# Patient Record
Sex: Male | Born: 2000 | Race: White | Hispanic: No | Marital: Single | Smoking: Current every day smoker
Health system: Southern US, Community
[De-identification: ages and names within clinical notes are randomized; demographics above are authoritative.]

## PROBLEM LIST (undated history)

## (undated) DIAGNOSIS — E119 Type 2 diabetes mellitus without complications: Secondary | ICD-10-CM

## (undated) DIAGNOSIS — K900 Celiac disease: Secondary | ICD-10-CM

## (undated) HISTORY — PX: NO PAST SURGERIES: SHX2092

---
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
## 2018-10-15 DIAGNOSIS — E109 Type 1 diabetes mellitus without complications: Secondary | ICD-10-CM | POA: Diagnosis not present

## 2019-02-14 ENCOUNTER — Emergency Department
Admission: EM | Admit: 2019-02-14 | Discharge: 2019-02-14 | Disposition: A | Discharge status: Home / Self Care | Payer: 59 | Attending: Emergency Medicine | Admitting: Emergency Medicine

## 2019-02-14 ENCOUNTER — Encounter: Payer: Self-pay | Admitting: Emergency Medicine

## 2019-02-14 ENCOUNTER — Emergency Department: Payer: 59

## 2019-02-14 ENCOUNTER — Other Ambulatory Visit: Payer: Self-pay

## 2019-02-14 DIAGNOSIS — Z79899 Other long term (current) drug therapy: Secondary | ICD-10-CM | POA: Diagnosis not present

## 2019-02-14 DIAGNOSIS — R202 Paresthesia of skin: Secondary | ICD-10-CM | POA: Insufficient documentation

## 2019-02-14 DIAGNOSIS — K900 Celiac disease: Secondary | ICD-10-CM | POA: Diagnosis not present

## 2019-02-14 DIAGNOSIS — E1065 Type 1 diabetes mellitus with hyperglycemia: Secondary | ICD-10-CM | POA: Diagnosis not present

## 2019-02-14 DIAGNOSIS — R739 Hyperglycemia, unspecified: Secondary | ICD-10-CM

## 2019-02-14 DIAGNOSIS — Z794 Long term (current) use of insulin: Secondary | ICD-10-CM | POA: Diagnosis not present

## 2019-02-14 DIAGNOSIS — M542 Cervicalgia: Secondary | ICD-10-CM | POA: Diagnosis present

## 2019-02-14 LAB — CBC WITH DIFFERENTIAL/PLATELET
Abs Immature Granulocytes: 0.04 10*3/uL (ref 0.00–0.07)
Basophils Absolute: 0 10*3/uL (ref 0.0–0.1)
Basophils Relative: 1 %
Eosinophils Absolute: 0.2 10*3/uL (ref 0.0–0.5)
Eosinophils Relative: 4 %
HCT: 46.4 % (ref 39.0–52.0)
Hemoglobin: 16.4 g/dL (ref 13.0–17.0)
Immature Granulocytes: 1 %
Lymphocytes Relative: 27 %
Lymphs Abs: 1.4 10*3/uL (ref 0.7–4.0)
MCH: 34.4 pg — ABNORMAL HIGH (ref 26.0–34.0)
MCHC: 35.3 g/dL (ref 30.0–36.0)
MCV: 97.3 fL (ref 80.0–100.0)
Monocytes Absolute: 0.3 10*3/uL (ref 0.1–1.0)
Monocytes Relative: 7 %
Neutro Abs: 3.1 10*3/uL (ref 1.7–7.7)
Neutrophils Relative %: 60 %
Platelets: 279 10*3/uL (ref 150–400)
RBC: 4.77 MIL/uL (ref 4.22–5.81)
RDW: 11.4 % — ABNORMAL LOW (ref 11.5–15.5)
WBC: 5.2 10*3/uL (ref 4.0–10.5)
nRBC: 0 % (ref 0.0–0.2)

## 2019-02-14 LAB — COMPREHENSIVE METABOLIC PANEL
ALT: 31 U/L (ref 0–44)
AST: 25 U/L (ref 15–41)
Albumin: 4.2 g/dL (ref 3.5–5.0)
Alkaline Phosphatase: 105 U/L (ref 38–126)
Anion gap: 16 — ABNORMAL HIGH (ref 5–15)
BUN: 25 mg/dL — ABNORMAL HIGH (ref 6–20)
CO2: 20 mmol/L — ABNORMAL LOW (ref 22–32)
Calcium: 9.6 mg/dL (ref 8.9–10.3)
Chloride: 100 mmol/L (ref 98–111)
Creatinine, Ser: 0.9 mg/dL (ref 0.61–1.24)
GFR calc Af Amer: >60 mL/min (ref >60)
GFR calc non Af Amer: >60 mL/min (ref >60)
Glucose, Bld: 342 mg/dL — ABNORMAL HIGH (ref 70–99)
Potassium: 4.4 mmol/L (ref 3.5–5.1)
Sodium: 136 mmol/L (ref 135–145)
Total Bilirubin: 0.9 mg/dL (ref 0.3–1.2)
Total Protein: 7.4 g/dL (ref 6.5–8.1)

## 2019-02-14 LAB — GLUCOSE, CAPILLARY: Glucose-Capillary: 460 mg/dL — ABNORMAL HIGH (ref 70–99)
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
## 2019-02-14 MED ORDER — CYCLOBENZAPRINE HCL 5 MG PO TABS: 5.0000 mg | ORAL_TABLET | Freq: Three times a day (TID) | ORAL | 0 refills | Status: AC | PRN

## 2019-02-14 MED ORDER — SODIUM CHLORIDE 0.9 % IV BOLUS
1000.0000 mL | Freq: Once | INTRAVENOUS | Status: AC
  Administered 2019-02-14: 1000 mL via INTRAVENOUS
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
## 2019-02-14 NOTE — ED Notes (Signed)
Pt Mother- Joelene Millin  [phone]

## 2019-05-26 ENCOUNTER — Observation Stay: Payer: 59

## 2019-05-26 ENCOUNTER — Other Ambulatory Visit: Payer: Self-pay

## 2019-05-26 ENCOUNTER — Observation Stay
Admission: EM | Admit: 2019-05-26 | Discharge: 2019-05-27 | Disposition: A | Discharge status: Home / Self Care | Payer: 59 | Attending: Family Medicine | Admitting: Family Medicine

## 2019-05-26 ENCOUNTER — Encounter: Payer: Self-pay | Admitting: Emergency Medicine

## 2019-05-26 ENCOUNTER — Emergency Department: Payer: 59

## 2019-05-26 DIAGNOSIS — N179 Acute kidney failure, unspecified: Secondary | ICD-10-CM | POA: Diagnosis not present

## 2019-05-26 DIAGNOSIS — R0602 Shortness of breath: Secondary | ICD-10-CM | POA: Diagnosis present

## 2019-05-26 DIAGNOSIS — E1110 Type 2 diabetes mellitus with ketoacidosis without coma: Secondary | ICD-10-CM | POA: Diagnosis present

## 2019-05-26 DIAGNOSIS — Z794 Long term (current) use of insulin: Secondary | ICD-10-CM | POA: Insufficient documentation

## 2019-05-26 DIAGNOSIS — R109 Unspecified abdominal pain: Secondary | ICD-10-CM

## 2019-05-26 DIAGNOSIS — E1010 Type 1 diabetes mellitus with ketoacidosis without coma: Principal | ICD-10-CM

## 2019-05-26 DIAGNOSIS — U071 COVID-19: Secondary | ICD-10-CM | POA: Diagnosis not present

## 2019-05-26 LAB — BASIC METABOLIC PANEL
Anion gap: 15 (ref 5–15)
Anion gap: 14 (ref 5–15)
BUN: 18 mg/dL (ref 6–20)
BUN: 17 mg/dL (ref 6–20)
CO2: 19 mmol/L — ABNORMAL LOW (ref 22–32)
CO2: 18 mmol/L — ABNORMAL LOW (ref 22–32)
Calcium: 8.2 mg/dL — ABNORMAL LOW (ref 8.9–10.3)
Calcium: 8.2 mg/dL — ABNORMAL LOW (ref 8.9–10.3)
Chloride: 105 mmol/L (ref 98–111)
Chloride: 105 mmol/L (ref 98–111)
Creatinine, Ser: 0.93 mg/dL (ref 0.61–1.24)
Creatinine, Ser: 0.89 mg/dL (ref 0.61–1.24)
GFR calc Af Amer: >60 mL/min (ref >60)
GFR calc Af Amer: >60 mL/min (ref >60)
GFR calc non Af Amer: >60 mL/min (ref >60)
GFR calc non Af Amer: >60 mL/min (ref >60)
Glucose, Bld: 125 mg/dL — ABNORMAL HIGH (ref 70–99)
Glucose, Bld: 182 mg/dL — ABNORMAL HIGH (ref 70–99)
Potassium: 3.9 mmol/L (ref 3.5–5.1)
Potassium: 3.3 mmol/L — ABNORMAL LOW (ref 3.5–5.1)
Sodium: 139 mmol/L (ref 135–145)
Sodium: 137 mmol/L (ref 135–145)

## 2019-05-26 LAB — URINALYSIS, ROUTINE W REFLEX MICROSCOPIC
Bacteria, UA: NONE SEEN
Bilirubin Urine: NEGATIVE
Glucose, UA: >=500 mg/dL — AB
Hgb urine dipstick: NEGATIVE
Ketones, ur: 80 mg/dL — AB
Leukocytes,Ua: NEGATIVE
Nitrite: NEGATIVE
Protein, ur: 100 mg/dL — AB
Specific Gravity, Urine: 1.024 (ref 1.005–1.030)
pH: 6 (ref 5.0–8.0)

## 2019-05-26 LAB — COMPREHENSIVE METABOLIC PANEL
ALT: 33 U/L (ref 0–44)
AST: 25 U/L (ref 15–41)
Albumin: 5.2 g/dL — ABNORMAL HIGH (ref 3.5–5.0)
Alkaline Phosphatase: 135 U/L — ABNORMAL HIGH (ref 38–126)
Anion gap: 28 — ABNORMAL HIGH (ref 5–15)
BUN: 19 mg/dL (ref 6–20)
CO2: 11 mmol/L — ABNORMAL LOW (ref 22–32)
Calcium: 9.4 mg/dL (ref 8.9–10.3)
Chloride: 99 mmol/L (ref 98–111)
Creatinine, Ser: 1.54 mg/dL — ABNORMAL HIGH (ref 0.61–1.24)
GFR calc Af Amer: >60 mL/min (ref >60)
GFR calc non Af Amer: >60 mL/min (ref >60)
Glucose, Bld: 295 mg/dL — ABNORMAL HIGH (ref 70–99)
Potassium: 4.5 mmol/L (ref 3.5–5.1)
Sodium: 138 mmol/L (ref 135–145)
Total Bilirubin: 2.1 mg/dL — ABNORMAL HIGH (ref 0.3–1.2)
Total Protein: 9.6 g/dL — ABNORMAL HIGH (ref 6.5–8.1)

## 2019-05-26 LAB — GLUCOSE, CAPILLARY
Glucose-Capillary: 105 mg/dL — ABNORMAL HIGH (ref 70–99)
Glucose-Capillary: 130 mg/dL — ABNORMAL HIGH (ref 70–99)
Glucose-Capillary: 140 mg/dL — ABNORMAL HIGH (ref 70–99)
Glucose-Capillary: 157 mg/dL — ABNORMAL HIGH (ref 70–99)
Glucose-Capillary: 182 mg/dL — ABNORMAL HIGH (ref 70–99)
Glucose-Capillary: 183 mg/dL — ABNORMAL HIGH (ref 70–99)
Glucose-Capillary: 246 mg/dL — ABNORMAL HIGH (ref 70–99)
Glucose-Capillary: 75 mg/dL (ref 70–99)
Glucose-Capillary: 90 mg/dL (ref 70–99)
Glucose-Capillary: 97 mg/dL (ref 70–99)

## 2019-05-26 LAB — CBC
HCT: 58.4 % — ABNORMAL HIGH (ref 39.0–52.0)
Hemoglobin: 20.5 g/dL — ABNORMAL HIGH (ref 13.0–17.0)
MCH: 33.2 pg (ref 26.0–34.0)
MCHC: 35.1 g/dL (ref 30.0–36.0)
MCV: 94.5 fL (ref 80.0–100.0)
Platelets: 338 10*3/uL (ref 150–400)
RBC: 6.18 MIL/uL — ABNORMAL HIGH (ref 4.22–5.81)
RDW: 11.9 % (ref 11.5–15.5)
WBC: 7.2 10*3/uL (ref 4.0–10.5)
nRBC: 0 % (ref 0.0–0.2)

## 2019-05-26 LAB — BLOOD GAS, VENOUS
Acid-base deficit: 13.1 mmol/L — ABNORMAL HIGH (ref 0.0–2.0)
Bicarbonate: 13.5 mmol/L — ABNORMAL LOW (ref 20.0–28.0)
O2 Saturation: 65.7 %
Patient temperature: 37
pCO2, Ven: 33 mmHg — ABNORMAL LOW (ref 44.0–60.0)
pH, Ven: 7.22 — ABNORMAL LOW (ref 7.250–7.430)
pO2, Ven: 42 mmHg (ref 32.0–45.0)

## 2019-05-26 LAB — BETA-HYDROXYBUTYRIC ACID: Beta-Hydroxybutyric Acid: >8 mmol/L — ABNORMAL HIGH (ref 0.05–0.27)

## 2019-05-26 LAB — SARS CORONAVIRUS 2 (TAT 6-24 HRS): SARS Coronavirus 2: POSITIVE — AB

## 2019-05-26 IMAGING — DX DG ABD PORTABLE 1V
2 series · 2 of 2 positions shown · non-contrast
Comparison: None.

CLINICAL DATA: 18-year-old male with tachycardia, weakness and
vomiting.

EXAM:
PORTABLE ABDOMEN - 1 VIEW

[abdomen supine (1 of 2)]
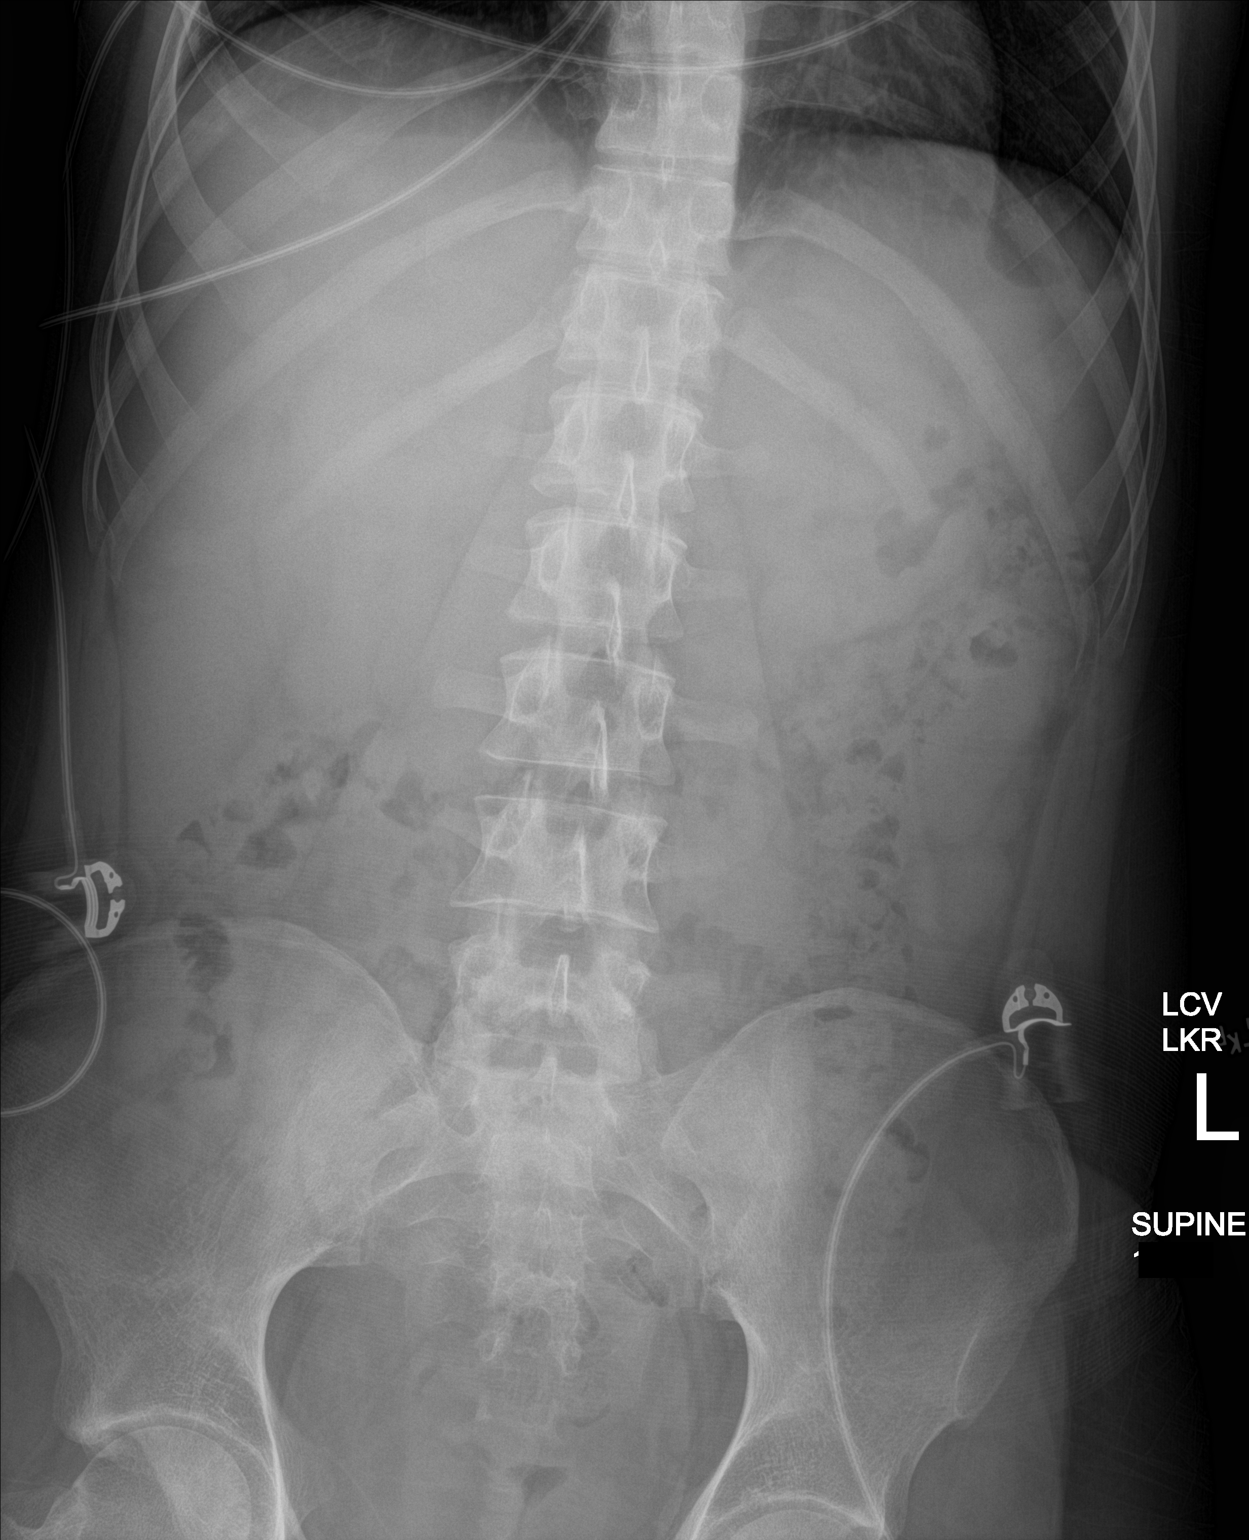

[abdomen supine (2 of 2)]
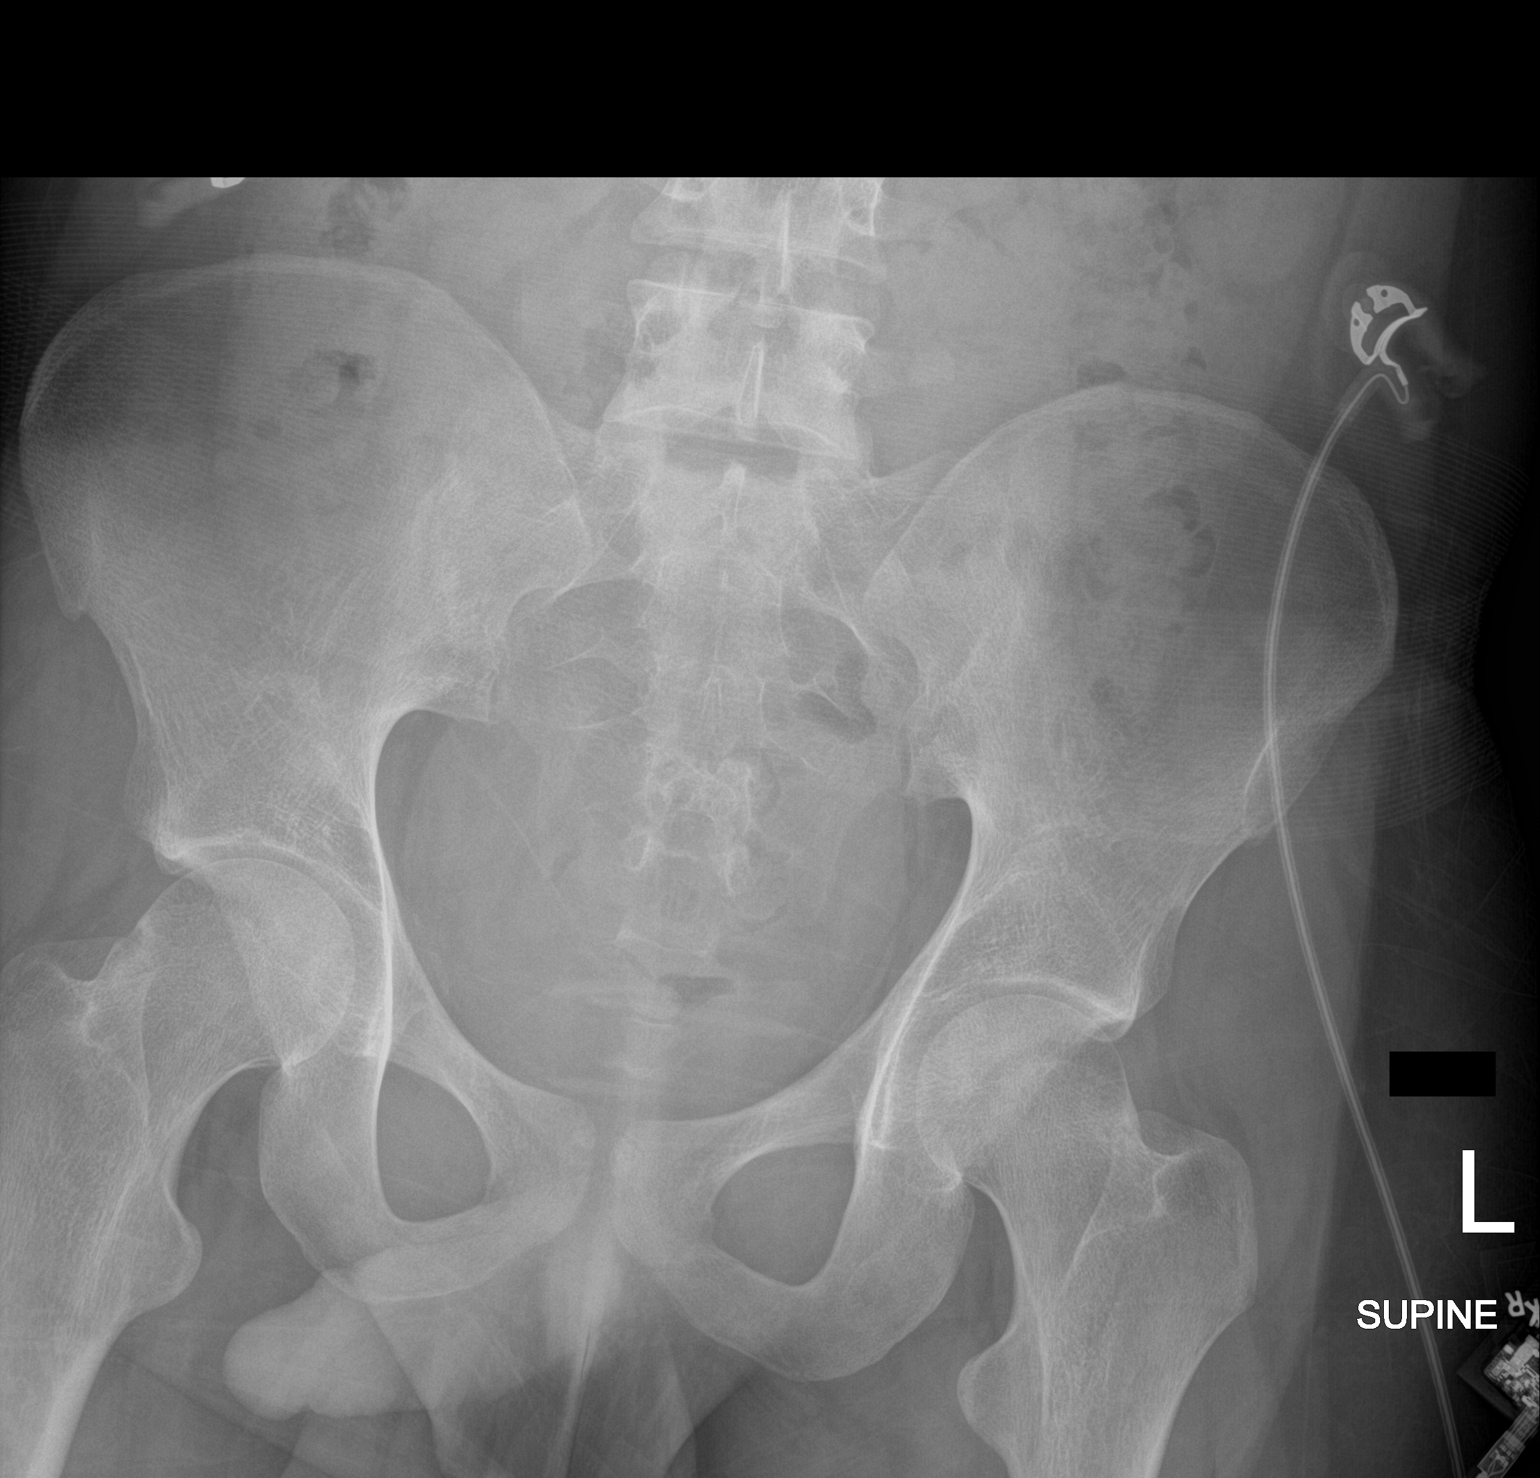

[2 of 2 positions shown; findings below may reference images not displayed]

FINDINGS: Supine views of the abdomen and pelvis. Grossly negative visible
lung bases. Non obstructed bowel gas pattern. No pneumoperitoneum is
evident on these supine views. Abdominal and pelvic visceral
contours are within normal limits. No osseous abnormality
identified.
IMPRESSION: Negative.

## 2019-05-26 IMAGING — CR DG CHEST 2V
2 series · 2 of 2 positions shown · non-contrast
Comparison: None.

CLINICAL DATA: 18-year-old male with shortness of breath, vomiting,
left side chest pain.

EXAM:
CHEST - 2 VIEW

[chest pa]
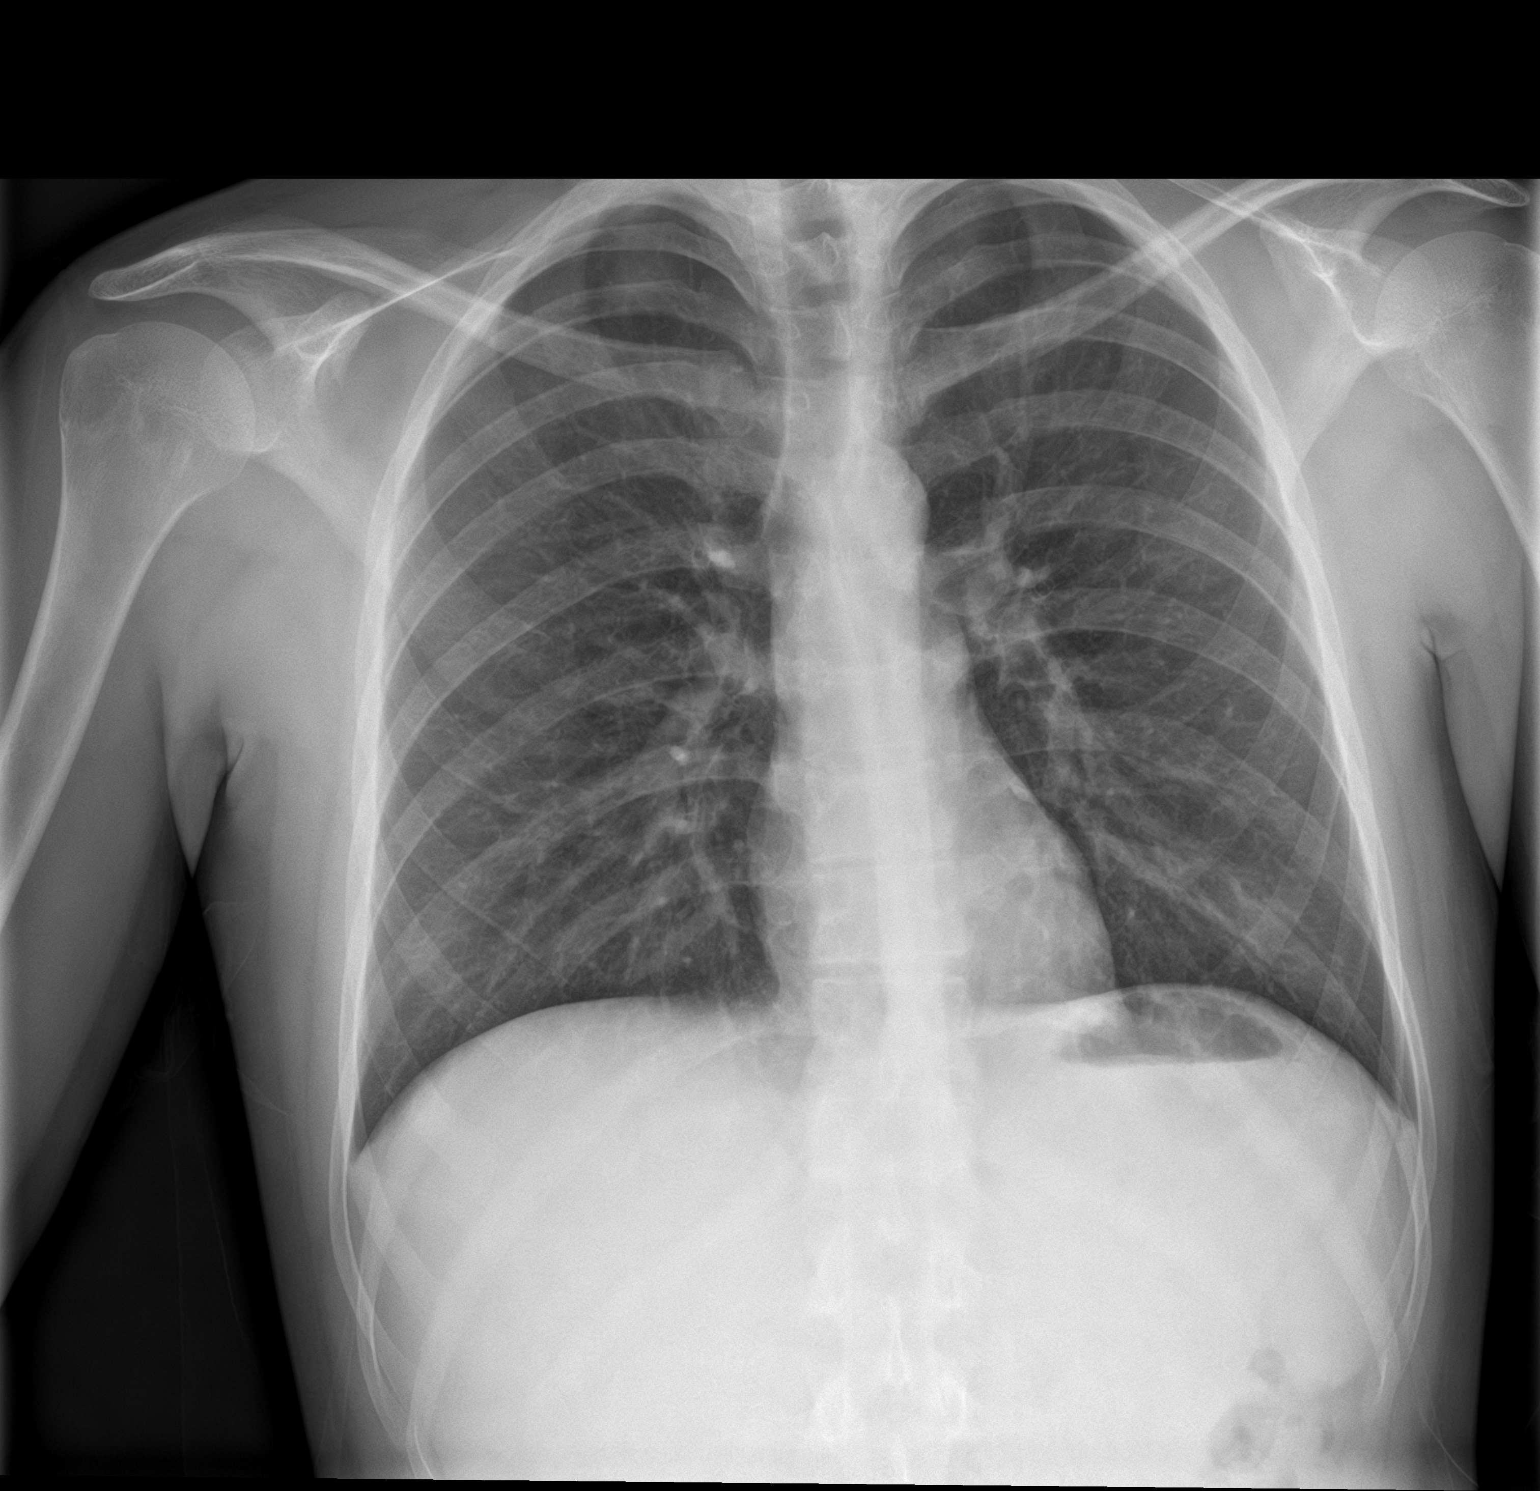

[chest lat]
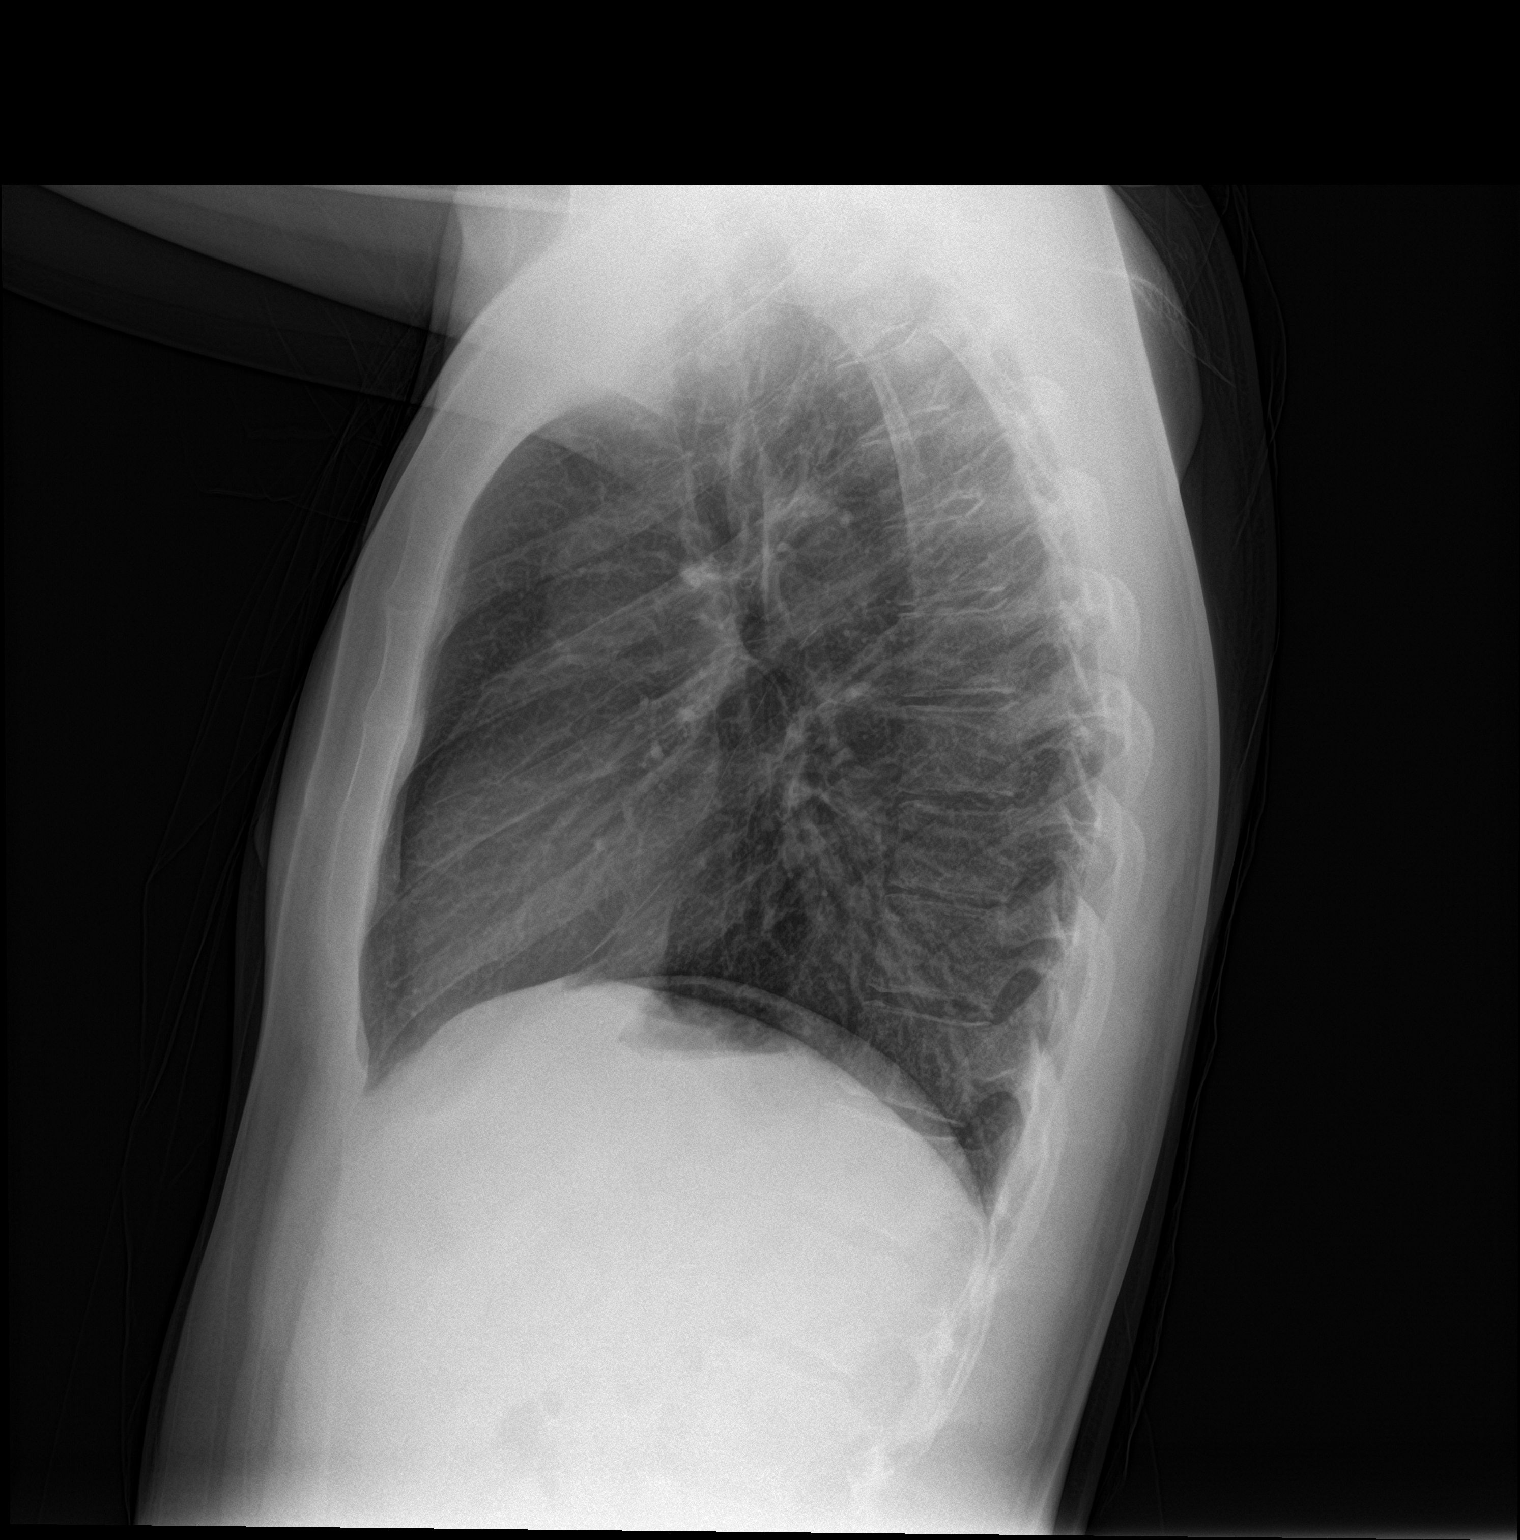

[2 of 2 positions shown; findings below may reference images not displayed]

FINDINGS: Normal lung volumes and mediastinal contours. Visualized tracheal
air column is within normal limits. Bilateral lung markings are
within normal limits. No pneumothorax, pleural effusion or abnormal
pulmonary opacity. Negative visible bowel gas and osseous
structures.
IMPRESSION: Negative.  No cardiopulmonary abnormality.

## 2019-05-26 MED ORDER — INSULIN ASPART 100 UNIT/ML ~~LOC~~ SOLN
0.0000 [IU] | Freq: Every day | SUBCUTANEOUS | Status: DC
  Administered 2019-05-26: 2 [IU] via SUBCUTANEOUS
  Filled 2019-05-26: qty 1

## 2019-05-26 MED ORDER — INSULIN ASPART 100 UNIT/ML ~~LOC~~ SOLN
0.0000 [IU] | Freq: Three times a day (TID) | SUBCUTANEOUS | Status: DC
  Administered 2019-05-27: 8 [IU] via SUBCUTANEOUS
  Filled 2019-05-26: qty 1

## 2019-05-26 MED ORDER — DEXTROSE-NACL 5-0.45 % IV SOLN: INTRAVENOUS | Status: DC

## 2019-05-26 MED ORDER — POLYETHYLENE GLYCOL 3350 17 G PO PACK: 17.0000 g | PACK | Freq: Every day | ORAL | Status: DC | PRN

## 2019-05-26 MED ORDER — POTASSIUM CHLORIDE 10 MEQ/100ML IV SOLN
10.0000 meq | INTRAVENOUS | Status: AC
  Filled 2019-05-26 (×2): qty 100

## 2019-05-26 MED ORDER — SODIUM CHLORIDE 0.9 % IV SOLN: INTRAVENOUS | Status: DC

## 2019-05-26 MED ORDER — ALUM & MAG HYDROXIDE-SIMETH 200-200-20 MG/5ML PO SUSP: 30.0000 mL | ORAL | Status: DC | PRN

## 2019-05-26 MED ORDER — MORPHINE SULFATE (PF) 2 MG/ML IV SOLN: 2.0000 mg | INTRAVENOUS | Status: DC | PRN

## 2019-05-26 MED ORDER — ONDANSETRON HCL 4 MG/2ML IJ SOLN: 4.0000 mg | Freq: Four times a day (QID) | INTRAMUSCULAR | Status: DC | PRN

## 2019-05-26 MED ORDER — ACETAMINOPHEN 325 MG PO TABS: 650.0000 mg | ORAL_TABLET | Freq: Four times a day (QID) | ORAL | Status: DC | PRN

## 2019-05-26 MED ORDER — HEPARIN SODIUM (PORCINE) 5000 UNIT/ML IJ SOLN
5000.0000 [IU] | Freq: Three times a day (TID) | INTRAMUSCULAR | Status: DC
  Administered 2019-05-26: 5000 [IU] via SUBCUTANEOUS
  Filled 2019-05-26 (×2): qty 1

## 2019-05-26 MED ORDER — DEXTROSE-NACL 5-0.45 % IV SOLN
INTRAVENOUS | Status: DC
  Administered 2019-05-26: 12:00:00 via INTRAVENOUS

## 2019-05-26 MED ORDER — INSULIN GLARGINE 100 UNIT/ML ~~LOC~~ SOLN
20.0000 [IU] | Freq: Every day | SUBCUTANEOUS | Status: DC
  Administered 2019-05-26: 20 [IU] via SUBCUTANEOUS
  Filled 2019-05-26 (×2): qty 0.2

## 2019-05-26 MED ORDER — LACTATED RINGERS IV BOLUS
1000.0000 mL | Freq: Once | INTRAVENOUS | Status: AC
  Administered 2019-05-26: 1000 mL via INTRAVENOUS

## 2019-05-26 MED ORDER — INSULIN REGULAR(HUMAN) IN NACL 100-0.9 UT/100ML-% IV SOLN
INTRAVENOUS | Status: DC
  Administered 2019-05-26: 0.3 [IU]/h via INTRAVENOUS

## 2019-05-26 MED ORDER — INSULIN REGULAR(HUMAN) IN NACL 100-0.9 UT/100ML-% IV SOLN
INTRAVENOUS | Status: DC
  Filled 2019-05-26: qty 100

## 2019-05-26 NOTE — ED Notes (Signed)
Insulin continued to be held. CBG 91.
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
## 2019-05-26 NOTE — ED Triage Notes (Signed)
Pt reports symptoms started Saturday. Pt c/o feeling his heart is beating fast and he is really weak and tired and feels like he cannot breathe.

## 2019-05-26 NOTE — ED Notes (Signed)
Hospitalist notified of positive covid.

## 2019-05-26 NOTE — H&P (Signed)
[facility] History and Physical   Patient: Jesus Ewing KNL:[id number]   PCP: Patient, No Pcp Per DOB: 07/17/2001   DOA: 05/26/2019   DOS: 05/26/2019   DOS: the patient was seen and examined on 05/26/2019  Patient coming from: The patient is coming from Home  Chief Complaint: Abdominal pain and nausea and vomiting  HPI: [name] is a 18 y.o. male with Past medical history of type 1 diabetes mellitus, celiac disease. Patient with type 1 diabetes mellitus. Who presented with complaints of nausea and vomitings and abdominal pain ongoing since Friday night. He denies any diarrhea or constipation. He denies having any fever or chills. He mentions that he also has some abdominal pain but not passing gas. No swelling of the leg. He mentioned that he is compliant with all his medication and has not missed out on any of his medications.  ED Course: Presented with this complaint. Anion gap was 28 on admission. Patient was started on DKA protocol and was referred for admission.  At his baseline ambulates without assistance independent for most of his ADL;  manages his medication on his own.  Review of Systems: as mentioned in the history of present illness.  All other systems reviewed and are negative.  Past Medical History:  Diagnosis Date  . Celiac disease   . Diabetes mellitus without complication [facility])    Past Surgical History:  Procedure Laterality Date  . NO PAST SURGERIES     Social History:  reports that he has never smoked. He has never used smokeless tobacco. He reports that he does not drink alcohol or use drugs.  No Known Allergies   Family history reviewed and not pertinent   Prior to Admission medications   Medication Sig Start Date End Date Taking? Authorizing Provider  insulin lispro (HUMALOG) 100 UNIT/ML KwikPen Inject 0-50 Units into the skin as directed. 07/24/18  Yes [provider]  LANTUS SOLOSTAR 100 UNIT/ML Solostar Pen Inject 30  Units into the skin at bedtime. 05/04/19  Yes [provider]  insulin aspart (NOVOLOG) 100 UNIT/ML injection Inject 1-8 Units into the skin 3 (three) times daily before meals. Using a sliding scale. 1 unit per 25 over 100 blood glucose reading.    [provider]  insulin aspart protamine- aspart (NOVOLOG MIX 70/30) (70-30) 100 UNIT/ML injection Inject 44 Units into the skin 2 (two) times daily with a meal.     [provider]    Physical Exam: Vitals:   05/26/19 0917 05/26/19 0920 05/26/19 1200 05/26/19 1400  BP:  (!) 143/90 (!) 118/57   Pulse:  (!) 145 (!) 103 93  Resp:  (!) 21 15 17   Temp:  98.1 F (36.7 C)    TempSrc:  Oral    SpO2:  99% 100% 99%  Weight: 72.6 kg     Height: 5\' 10"  (1.778 m)       General: alert and oriented to time, place, and person. Appear in moderate distress, affect anxious Eyes: PERRL, Conjunctiva normal ENT: Oral Mucosa Clear, dry  Neck: no JVD, no Abnormal Mass Or lumps Cardiovascular: S1 and S2 Present, no Murmur, peripheral pulses symmetrical Respiratory: good respiratory effort, Bilateral Air entry equal and Decreased, no signs of accessory muscle use, Clear to Auscultation, no Crackles, no wheezes Abdomen: Bowel Sound present, Soft and mild tenderness, no hernia Skin: no rashes  Extremities: no Pedal edema, no calf tenderness Neurologic: without any new focal findings Gait not checked due to patient safety concerns  Data Reviewed: I have personally reviewed and interpreted labs, imaging as discussed below.  CBC: Recent Labs  Lab 05/26/19 0931  WBC 7.2  HGB 20.5*  HCT 58.4*  MCV 94.5  PLT 937   Basic Metabolic Panel: Recent Labs  Lab 05/26/19 0931  NA 138  K 4.5  CL 99  CO2 11*  GLUCOSE 295*  BUN 19  CREATININE 1.54*  CALCIUM 9.4   GFR: Estimated Creatinine Clearance: 78.6 mL/min (A) (by C-G formula based on SCr of 1.54 mg/dL (H)). Liver Function Tests: Recent Labs  Lab 05/26/19 0931  AST 25   ALT 33  ALKPHOS 135*  BILITOT 2.1*  PROT 9.6*  ALBUMIN 5.2*   No results for input(s): LIPASE, AMYLASE in the last 168 hours. No results for input(s): AMMONIA in the last 168 hours. Coagulation Profile: No results for input(s): INR, PROTIME in the last 168 hours. Cardiac Enzymes: No results for input(s): CKTOTAL, CKMB, CKMBINDEX, TROPONINI in the last 168 hours. BNP (last 3 results) No results for input(s): PROBNP in the last 8760 hours. HbA1C: No results for input(s): HGBA1C in the last 72 hours. CBG: Recent Labs  Lab 05/26/19 1216 05/26/19 1330 05/26/19 1442  GLUCAP 90 75 105*   Lipid Profile: No results for input(s): CHOL, HDL, LDLCALC, TRIG, CHOLHDL, LDLDIRECT in the last 72 hours. Thyroid Function Tests: No results for input(s): TSH, T4TOTAL, FREET4, T3FREE, THYROIDAB in the last 72 hours. Anemia Panel: No results for input(s): VITAMINB12, FOLATE, FERRITIN, TIBC, IRON, RETICCTPCT in the last 72 hours. Urine analysis:    Component Value Date/Time   COLORURINE YELLOW (A) 06/19/2016 1742   APPEARANCEUR CLEAR (A) 06/19/2016 1742   LABSPEC 1.028 06/19/2016 1742   PHURINE 5.0 06/19/2016 1742   GLUCOSEU >500 (A) 06/19/2016 1742   HGBUR NEGATIVE 06/19/2016 1742   BILIRUBINUR NEGATIVE 06/19/2016 1742   KETONESUR 2+ (A) 06/19/2016 1742   PROTEINUR 100 (A) 06/19/2016 1742   NITRITE NEGATIVE 06/19/2016 1742   LEUKOCYTESUR NEGATIVE 06/19/2016 1742    Radiological Exams on Admission: Dg Chest 2 View  Result Date: 05/26/2019 CLINICAL DATA:  18 year old male with shortness of breath, vomiting, left side chest pain. EXAM: CHEST - 2 VIEW COMPARISON:  None. FINDINGS: Normal lung volumes and mediastinal contours. Visualized tracheal air column is within normal limits. Bilateral lung markings are within normal limits. No pneumothorax, pleural effusion or abnormal pulmonary opacity. Negative visible bowel gas and osseous structures. IMPRESSION: Negative.  No cardiopulmonary  abnormality. Electronically Signed   By: Genevie Ann M.D.   On: 05/26/2019 09:59   I reviewed all nursing notes, pharmacy notes, vitals, pertinent old records.  Assessment/Plan 1.  DKA. Type 1 diabetes mellitus Acute kidney injury. Hemoconcentration. Hyperbilirubinemia. pH 7.22. Beta hydroxybutyric acid higher than detectable range. UA currently pending. Continue with aggressive hydration. Started on bolus in the ED. Also started on IV insulin as well as insulin glucose stabilizer protocol. We will admit to stepdown unit. X-ray abdomen to rule out any acute intra-abdominal pathology.  Nutrition: NPO  DVT Prophylaxis: Subcutaneous Lovenox  Advance goals of care discussion: Full code  Consults: none   Family Communication: no family was present at bedside, at the time of interview.   Disposition: Admitted as observation, step-down unit. Likely to be discharged home, in 2 days.  I have discussed plan of care as described above with RN and patient/family.  Author: Berle Mull, MD [facility] 05/26/2019 3:37 PM   To reach On-call, see care teams to locate the attending and  reach out to them via [email or web address]. If 7PM-7AM, please contact night-coverage If you still have difficulty reaching the attending provider, please page the [facility] (Director on Call) for [facility] on amion for assistance.

## 2019-05-26 NOTE — ED Notes (Signed)
Pt given food brought by friend.

## 2019-05-27 ENCOUNTER — Inpatient Hospital Stay: Admission: AD | Admit: 2019-05-27 | Payer: 59 | Source: Other Acute Inpatient Hospital | Admitting: Family Medicine

## 2019-05-27 DIAGNOSIS — E1010 Type 1 diabetes mellitus with ketoacidosis without coma: Secondary | ICD-10-CM | POA: Diagnosis not present

## 2019-05-27 LAB — GLUCOSE, CAPILLARY
Glucose-Capillary: 308 mg/dL — ABNORMAL HIGH (ref 70–99)
Glucose-Capillary: 280 mg/dL — ABNORMAL HIGH (ref 70–99)

## 2019-05-27 LAB — HEMOGLOBIN A1C
Hgb A1c MFr Bld: 10.1 % — ABNORMAL HIGH (ref 4.8–5.6)
Mean Plasma Glucose: 243.17 mg/dL

## 2019-05-27 LAB — HIV ANTIBODY (ROUTINE TESTING W REFLEX): HIV Screen 4th Generation wRfx: NONREACTIVE

## 2019-05-27 MED ORDER — INSULIN ASPART 100 UNIT/ML ~~LOC~~ SOLN
10.0000 [IU] | Freq: Once | SUBCUTANEOUS | Status: AC
  Administered 2019-05-27: 10 [IU] via SUBCUTANEOUS
  Filled 2019-05-27: qty 1

## 2019-05-27 NOTE — ED Notes (Signed)
Pt alert and oriented X 4, stable for discharge. RR even and unlabored, color WNL. Discussed discharge instructions and follow up when appropriate. Instructed to follow up with ER for any life threatening symptoms or concerns that patient or family of patient may have Pt left wearing mask and verbalizes understanding of quarantine. Verbalizes understanding of strict blood sugar control.

## 2019-05-27 NOTE — ED Notes (Addendum)
CBG 280. Ally,RN made aware.

## 2019-05-27 NOTE — ED Notes (Addendum)
Hospitalist at bedside 

## 2019-05-27 NOTE — ED Notes (Signed)
Pt reports he does not have legal guardian, none listed. Pt calling his mother to come pick him up.

## 2019-05-27 NOTE — ED Notes (Signed)
CBG 308. On call hospitalist messaged.

## 2019-05-27 NOTE — ED Notes (Signed)
Pt resting quietly at this time with TV on. Even respirations.

## 2019-05-27 NOTE — Discharge Summary (Signed)
Physician Discharge Summary  Jesus Ewing KVQ:[id number] DOB: [birth year] DOA: 05/26/2019  PCP: Patient, No Pcp Per  Admit date: 05/26/2019 Discharge date: 05/27/2019  Admitted From: Home Disposition:  [place]: None  Equipment/Devices: None  Discharge Condition: Good  CODE STATUS: FULL Diet recommendation: Diabetic  Brief/Interim Summary: [name] is a 18 y.o. M with T1DM well controlled who presented with few days malaise, cough and N/V epigastric pain.  Had been at a house party over the weekend where someone was rumored to have [place].  Afterwards, started to feel vague malaise, then aches then decreased appetite, then started to have labile sugars, then high sugars and malaise, vomiting, epigastric pain, so he came to the ER.  In the ER, Glucose 300s, Anion gap 28.  Started on insulin and IV fluids.  COVID incidentally positive.     PRINCIPAL HOSPITAL DIAGNOSIS: DKA due to [place]    Discharge Diagnoses:   Diabetic ketoacidosis Type 1 diabetes Patient treated with IV insulin.  Gap closed overnight, transitioned to subQ insulin this morning and sugars controlled.  Patient requesting to go home and self-manage at home.    Mild COVID-19 Has cough, no hypoxia.  CXR clear.    Self-monitoring instructions, return precautions and isolation instructions given clearly.  Patient expressed understanding, has a tiny home where he can easily self-isolate.  Someone can get him a pulse oximeter.         Discharge Instructions  Discharge Instructions    Diet Carb Modified   Complete by: As directed    Discharge instructions   Complete by: As directed    From Dr. Loleta Books: You were admitted for DKA We also found that you had coronavirus (Also known as COVID-19)   For the DKA, we treated you with IV fluids and IV insulin and corrected the DKA.  You should resume your normal diabetes regimen. You may need to use more than your normal home Lantus for now  (if your morniing blood sugar is over 200, I would give extra short acting with each meal that day, and increase your evening dose the next night)  We also have documented that you take 70/30, which I assume you do not.  So don't take this.   If you have any lingering cough, you should take the cough syrup we gave you here, Robitussin (with the ingredients "GUIAFENESIN" and "DEXTROMETHORPHAN")   You should purchase a pulse oximeter at your pharmacy. This is a device that you put on your finger to measure your oxygen level.  They are available at any pharmacy. Use it to check your oxygen level twice daily until you see your primary care doctor. If your oxygen level is ever LESS than 88% and doesn't get better, you should call your primary care doctor immediately.   HOW LONG TO REMAIN IN QUARANTINE: There is no absolutely correct answer to this and so our best answer is to be on the cautious side.  Based on what we know of the virus, you should isolate strictly until 10 days from your first symptoms.  For you that would be next Wednesday PROVIDED YOUR SYMPTOMS ARE ALL RESOLVED BY THEN  Until you end your quarantine: If you have anyone in the home who has NOT had coronavirus:    -do not be in the same room with them until your self isolation is over    -wear a mask and have them wear a mask if you MUST be in the same  room    -clean all hard surfaces (counters, doors, tables) twice a day    -use a separate bathroom at all times     If you have any abdominal pain, can't keep food down, feel like you can't breathe or have O2 levels <88% on the pulse oximter, come back to the ER.   Increase activity slowly   Complete by: As directed      Allergies as of 05/27/2019   No Known Allergies     Medication List    TAKE these medications   insulin aspart 100 UNIT/ML injection Commonly known as: novoLOG Inject 1-8 Units into the skin 3 (three) times daily before meals. Using a sliding  scale. 1 unit per 25 over 100 blood glucose reading.   insulin aspart protamine- aspart (70-30) 100 UNIT/ML injection Commonly known as: NOVOLOG MIX 70/30 Inject 44 Units into the skin 2 (two) times daily with a meal.   insulin lispro 100 UNIT/ML KwikPen Commonly known as: HUMALOG Inject 0-50 Units into the skin as directed.   Lantus SoloStar 100 UNIT/ML Solostar Pen Generic drug: Insulin Glargine Inject 30 Units into the skin at bedtime.      Follow-up Information    Primary care doctor. Schedule an appointment as soon as possible for a visit in 3 week(s).          No Known Allergies  Consultations:     Procedures/Studies: Dg Chest 2 View  Result Date: 05/26/2019 CLINICAL DATA:  18 year old male with shortness of breath, vomiting, left side chest pain. EXAM: CHEST - 2 VIEW COMPARISON:  None. FINDINGS: Normal lung volumes and mediastinal contours. Visualized tracheal air column is within normal limits. Bilateral lung markings are within normal limits. No pneumothorax, pleural effusion or abnormal pulmonary opacity. Negative visible bowel gas and osseous structures. IMPRESSION: Negative.  No cardiopulmonary abnormality. Electronically Signed   By: Odessa Fleming M.D.   On: 05/26/2019 09:59   Dg Abd Portable 1v  Result Date: 05/26/2019 CLINICAL DATA:  18 year old male with tachycardia, weakness and vomiting. EXAM: PORTABLE ABDOMEN - 1 VIEW COMPARISON:  None. FINDINGS: Supine views of the abdomen and pelvis. Grossly negative visible lung bases. Non obstructed bowel gas pattern. No pneumoperitoneum is evident on these supine views. Abdominal and pelvic visceral contours are within normal limits. No osseous abnormality identified. IMPRESSION: Negative. Electronically Signed   By: Odessa Fleming M.D.   On: 05/26/2019 16:13       Subjective: Feeling well.  Tired but no vomiting, no epigastric pain.  Able to eat breakfast.  No confusion. No fever, dyspnea.  Has cough.  Discharge Exam: Vitals:    05/27/19 0849 05/27/19 1011  BP: 117/74 118/76  Pulse:    Resp: 17 16  Temp: 98.2 F (36.8 C) 98 F (36.7 C)  SpO2: 98% 98%   Vitals:   05/27/19 0645 05/27/19 0730 05/27/19 0849 05/27/19 1011  BP:  108/90 117/74 118/76  Pulse: 72     Resp: 13 (!) Temp:   98.2 F (36.8 C) 98 F (36.7 C)  TempSrc:   Oral Oral  SpO2: 98% 100% 98% 98%  Weight:      Height:        General: Pt is alert, awake, not in acute distress Cardiovascular: RRR, nl S1-S2, no murmurs appreciated.   No LE edema.   Respiratory: Normal respiratory rate and rhythm.  CTAB without rales or wheezes. Abdominal: Abdomen soft and non-tender.  No distension or HSM.  Neuro/Psych: Strength symmetric in upper and lower extremities.  Judgment and insight appear normal.   The results of significant diagnostics from this hospitalization (including imaging, microbiology, ancillary and laboratory) are listed below for reference.     Microbiology: Recent Results (from the past 240 hour(s))  SARS CORONAVIRUS 2 (TAT 6-24 HRS) Nasopharyngeal Nasopharyngeal Swab     Status: Abnormal   Collection Time: 05/26/19  1:15 PM   Specimen: Nasopharyngeal Swab  Result Value Ref Range Status   SARS Coronavirus 2 POSITIVE (A) NEGATIVE Final    Comment: RESULT CALLED TO, READ BACK BY AND VERIFIED WITH: J.COLTRAN RN 2129 05/26/2019 MCCORMICK K (NOTE) SARS-CoV-2 target nucleic acids are DETECTED. The SARS-CoV-2 RNA is generally detectable in upper and lower respiratory specimens during the acute phase of infection. Positive results are indicative of active infection with SARS-CoV-2. Clinical  correlation with patient history and other diagnostic information is necessary to determine patient infection status. Positive results do  not rule out bacterial infection or co-infection with other viruses. The expected result is Negative. Fact Sheet for Patients: HairSlick.nohttps://www.fda.gov/media/138098/download Fact Sheet for Healthcare  Providers: quierodirigir.comhttps://www.fda.gov/media/138095/download This test is not yet approved or cleared by the [place] FDA and  has been authorized for detection and/or diagnosis of SARS-CoV-2 by FDA under an Emergency Use Authorization (EUA). This EUA will remain  in effect (meaning this test can be used)  for the duration of the COVID-19 declaration under Section 564(b)(1) of the Act, 21 U.S.C. section 360bbb-3(b)(1), unless the authorization is terminated or revoked sooner. Performed at [facility], [address] [place], [place] [address]      Labs: BNP (last 3 results) No results for input(s): BNP in the last 8760 hours. Basic Metabolic Panel: Recent Labs  Lab 05/26/19 0931 05/26/19 1533 05/26/19 1939  NA 138 139 137  K 4.5 3.9 3.3*  CL 99 105 105  CO2 11* 19* 18*  GLUCOSE 295* 125* 182*  BUN 19 18 17   CREATININE 1.54* 0.93 0.89  CALCIUM 9.4 8.2* 8.2*   Liver Function Tests: Recent Labs  Lab 05/26/19 0931  AST 25  ALT 33  ALKPHOS 135*  BILITOT 2.1*  PROT 9.6*  ALBUMIN 5.2*   No results for input(s): LIPASE, AMYLASE in the last 168 hours. No results for input(s): AMMONIA in the last 168 hours. CBC: Recent Labs  Lab 05/26/19 0931  WBC 7.2  HGB 20.5*  HCT 58.4*  MCV 94.5  PLT 338   Cardiac Enzymes: No results for input(s): CKTOTAL, CKMB, CKMBINDEX, TROPONINI in the last 168 hours. BNP: Invalid input(s): POCBNP CBG: Recent Labs  Lab 05/26/19 2022 05/26/19 2125 05/26/19 2333 05/27/19 0239 05/27/19 0812  GLUCAP 140* 130* 246* 308* 280*   D-Dimer No results for input(s): DDIMER in the last 72 hours. Hgb A1c Recent Labs    05/26/19 1533  HGBA1C 10.1*   Lipid Profile No results for input(s): CHOL, HDL, LDLCALC, TRIG, CHOLHDL, LDLDIRECT in the last 72 hours. Thyroid function studies No results for input(s): TSH, T4TOTAL, T3FREE, THYROIDAB in the last 72 hours.  Invalid input(s): FREET3 Anemia work up No results for input(s):  VITAMINB12, FOLATE, FERRITIN, TIBC, IRON, RETICCTPCT in the last 72 hours. Urinalysis    Component Value Date/Time   COLORURINE YELLOW (A) 05/26/2019 1139   APPEARANCEUR CLEAR (A) 05/26/2019 1139   LABSPEC 1.024 05/26/2019 1139   PHURINE 6.0 05/26/2019 1139   GLUCOSEU >=500 (A) 05/26/2019 1139   HGBUR NEGATIVE 05/26/2019 1139   BILIRUBINUR NEGATIVE 05/26/2019  1139   KETONESUR 80 (A) 05/26/2019 1139   PROTEINUR 100 (A) 05/26/2019 1139   NITRITE NEGATIVE 05/26/2019 1139   LEUKOCYTESUR NEGATIVE 05/26/2019 1139   Sepsis Labs Invalid input(s): PROCALCITONIN,  WBC,  LACTICIDVEN Microbiology Recent Results (from the past 240 hour(s))  SARS CORONAVIRUS 2 (TAT 6-24 HRS) Nasopharyngeal Nasopharyngeal Swab     Status: Abnormal   Collection Time: 05/26/19  1:15 PM   Specimen: Nasopharyngeal Swab  Result Value Ref Range Status   SARS Coronavirus 2 POSITIVE (A) NEGATIVE Final    Comment: RESULT CALLED TO, READ BACK BY AND VERIFIED WITH: J.COLTRAN RN 2129 05/26/2019 MCCORMICK K (NOTE) SARS-CoV-2 target nucleic acids are DETECTED. The SARS-CoV-2 RNA is generally detectable in upper and lower respiratory specimens during the acute phase of infection. Positive results are indicative of active infection with SARS-CoV-2. Clinical  correlation with patient history and other diagnostic information is necessary to determine patient infection status. Positive results do  not rule out bacterial infection or co-infection with other viruses. The expected result is Negative. Fact Sheet for Patients: HairSlick.no Fact Sheet for Healthcare Providers: quierodirigir.com This test is not yet approved or cleared by the [place] FDA and  has been authorized for detection and/or diagnosis of SARS-CoV-2 by FDA under an Emergency Use Authorization (EUA). This EUA will remain  in effect (meaning this test can be used)  for the duration of the COVID-19  declaration under Section 564(b)(1) of the Act, 21 U.S.C. section 360bbb-3(b)(1), unless the authorization is terminated or revoked sooner. Performed at [facility], [address] [place], [place] [address]      Time coordinating discharge: 25 minutes      SIGNED:   Alberteen Sam, MD  [facility] 05/27/2019, 9:12 PM

## 2019-05-27 NOTE — ED Notes (Signed)
Patient is resting comfortably. 

## 2019-05-27 NOTE — ED Notes (Signed)
Pt sleeping at this time. Pt unable to urinate at this time. No distress noted.

## 2019-05-27 NOTE — Progress Notes (Addendum)
Inpatient Diabetes Program Recommendations  AACE/ADA: New Consensus Statement on Inpatient Glycemic Control (2015)  Target Ranges:  Prepandial:   less than 140 mg/dL      Peak postprandial:   less than 180 mg/dL (1-2 hours)      Critically ill patients:  140 - 180 mg/dL   Results for BINNIE, VONDERHAAR (MRN [id number]) as of 05/27/2019 09:12  Ref. Range 05/26/2019 12:16 05/26/2019 13:30 05/26/2019 14:42 05/26/2019 15:32 05/26/2019 16:47 05/26/2019 17:55 05/26/2019 19:04 05/26/2019 20:22 05/26/2019 21:25  Glucose-Capillary Latest Ref Range: 70 - 99 mg/dL 90  IV Insulin Drip 75  IV Insulin Drip OFF 105 (H)  IV Insulin Drip OFF 97  IV Insulin Drip OFF 182 (H)  IV Insulin Drip 157 (H)  IV Insulin Drip 183 (H)  IV Insulin Drip 140 (H)  IV Insulin Drip 130 (H)    20 units LANTUS given at 10:45pm   Results for JEVON, SHELLS (MRN [id number]) as of 05/27/2019 09:12  Ref. Range 05/26/2019 23:33 05/27/2019 02:39 05/27/2019 08:12  Glucose-Capillary Latest Ref Range: 70 - 99 mg/dL 246 (H)  2 units NOVOLOG  308 (H)  10 units NOVOLOG  280 (H)  8 units NOVOLOG    Results for SULEMAN, GUNNING (MRN [id number]) as of 05/27/2019 09:12  Ref. Range 05/26/2019 09:31  Beta-Hydroxybutyric Acid Latest Ref Range: 0.05 - 0.27 mmol/L >8.00 (H)    Admit with: DKA  History: Type 1 Diabetes, Celiac Disease  Home DM Meds: Lantus 30 units QHS       Novolog 1-8 units TID (1:25 mg/dl >100 mg/dl)       70/30 Insulin 44 units BID??  Current Orders: Lantus 20 units QHS      Novolog Moderate Correction Scale/ SSI (0-15 units) TID AC + HS     Transitioned off the IV Insulin drip late last PM.  Lantus given at 10:45pm.  Endocrinologist: Dr. Marland Kitchen with [facility]--last seen 10/15/2018 by telemedicine visit. Was told to take the following: Lantus 30 units Daily Humalog 1 unit for every 10 grams Carbs Humalog 1 unit for every 50 mg/dl above Target CBG of 150    MD- If patient admitted,  please consider the following:  1. Increase Lantus to 30 units QHS (home dose)  2. Reduce Novolog SSi to the Sensitive scale (0-9 units) TID AC + HS  3. Start Novolog Meal Coverage: Novolog 4 units TID with meals to start  (Please add the following Hold Parameters: Hold if pt eats <50% of meal, Hold if pt NPO)    --Will follow patient during hospitalization--  Wyn Quaker RN, MSN, CDE Diabetes Coordinator Inpatient Glycemic Control Team Team Pager: [phone] (8a-5p)

## 2019-05-27 NOTE — ED Notes (Signed)
Given breakfast tray. 

## 2020-03-09 ENCOUNTER — Emergency Department
Admission: EM | Admit: 2020-03-09 | Discharge: 2020-03-09 | Disposition: A | Discharge status: Home / Self Care | Payer: 59 | Attending: Emergency Medicine | Admitting: Emergency Medicine

## 2020-03-09 ENCOUNTER — Encounter: Payer: Self-pay | Admitting: Emergency Medicine

## 2020-03-09 ENCOUNTER — Emergency Department: Payer: 59

## 2020-03-09 ENCOUNTER — Other Ambulatory Visit: Payer: Self-pay

## 2020-03-09 DIAGNOSIS — E1010 Type 1 diabetes mellitus with ketoacidosis without coma: Secondary | ICD-10-CM | POA: Diagnosis not present

## 2020-03-09 DIAGNOSIS — Z794 Long term (current) use of insulin: Secondary | ICD-10-CM | POA: Diagnosis not present

## 2020-03-09 DIAGNOSIS — L03116 Cellulitis of left lower limb: Secondary | ICD-10-CM | POA: Insufficient documentation

## 2020-03-09 DIAGNOSIS — M79672 Pain in left foot: Secondary | ICD-10-CM | POA: Diagnosis present

## 2020-03-09 DIAGNOSIS — L02612 Cutaneous abscess of left foot: Secondary | ICD-10-CM | POA: Insufficient documentation

## 2020-03-09 DIAGNOSIS — L0291 Cutaneous abscess, unspecified: Secondary | ICD-10-CM

## 2020-03-09 DIAGNOSIS — L0390 Cellulitis, unspecified: Secondary | ICD-10-CM

## 2020-03-09 LAB — COMPREHENSIVE METABOLIC PANEL
ALT: 28 U/L (ref 0–44)
AST: 15 U/L (ref 15–41)
Albumin: 3.5 g/dL (ref 3.5–5.0)
Alkaline Phosphatase: 96 U/L (ref 38–126)
Anion gap: 13 (ref 5–15)
BUN: 17 mg/dL (ref 6–20)
CO2: 25 mmol/L (ref 22–32)
Calcium: 8.5 mg/dL — ABNORMAL LOW (ref 8.9–10.3)
Chloride: 96 mmol/L — ABNORMAL LOW (ref 98–111)
Creatinine, Ser: 0.76 mg/dL (ref 0.61–1.24)
GFR calc Af Amer: >60 mL/min (ref >60)
GFR calc non Af Amer: >60 mL/min (ref >60)
Glucose, Bld: 593 mg/dL (ref 70–99)
Potassium: 4.3 mmol/L (ref 3.5–5.1)
Sodium: 134 mmol/L — ABNORMAL LOW (ref 135–145)
Total Bilirubin: 1 mg/dL (ref 0.3–1.2)
Total Protein: 6.7 g/dL (ref 6.5–8.1)

## 2020-03-09 LAB — CBC WITH DIFFERENTIAL/PLATELET
Abs Immature Granulocytes: 0.02 10*3/uL (ref 0.00–0.07)
Basophils Absolute: 0 10*3/uL (ref 0.0–0.1)
Basophils Relative: 0 %
Eosinophils Absolute: 0.5 10*3/uL (ref 0.0–0.5)
Eosinophils Relative: 6 %
HCT: 42.7 % (ref 39.0–52.0)
Hemoglobin: 14.7 g/dL (ref 13.0–17.0)
Immature Granulocytes: 0 %
Lymphocytes Relative: 21 %
Lymphs Abs: 1.9 10*3/uL (ref 0.7–4.0)
MCH: 33.3 pg (ref 26.0–34.0)
MCHC: 34.4 g/dL (ref 30.0–36.0)
MCV: 96.8 fL (ref 80.0–100.0)
Monocytes Absolute: 0.6 10*3/uL (ref 0.1–1.0)
Monocytes Relative: 7 %
Neutro Abs: 5.7 10*3/uL (ref 1.7–7.7)
Neutrophils Relative %: 66 %
Platelets: 261 10*3/uL (ref 150–400)
RBC: 4.41 MIL/uL (ref 4.22–5.81)
RDW: 11.4 % — ABNORMAL LOW (ref 11.5–15.5)
WBC: 8.7 10*3/uL (ref 4.0–10.5)
nRBC: 0 % (ref 0.0–0.2)

## 2020-03-09 LAB — GLUCOSE, CAPILLARY: Glucose-Capillary: 196 mg/dL — ABNORMAL HIGH (ref 70–99)

## 2020-03-09 LAB — LACTIC ACID, PLASMA: Lactic Acid, Venous: 0.7 mmol/L (ref 0.5–1.9)

## 2020-03-09 IMAGING — CR DG FOOT COMPLETE 3+V*L*
2 series · 2 of 2 positions shown · non-contrast
Comparison: None.

CLINICAL DATA: Left foot pain, possible foreign body

EXAM:
LEFT FOOT - COMPLETE 3+ VIEW

[foot ap]
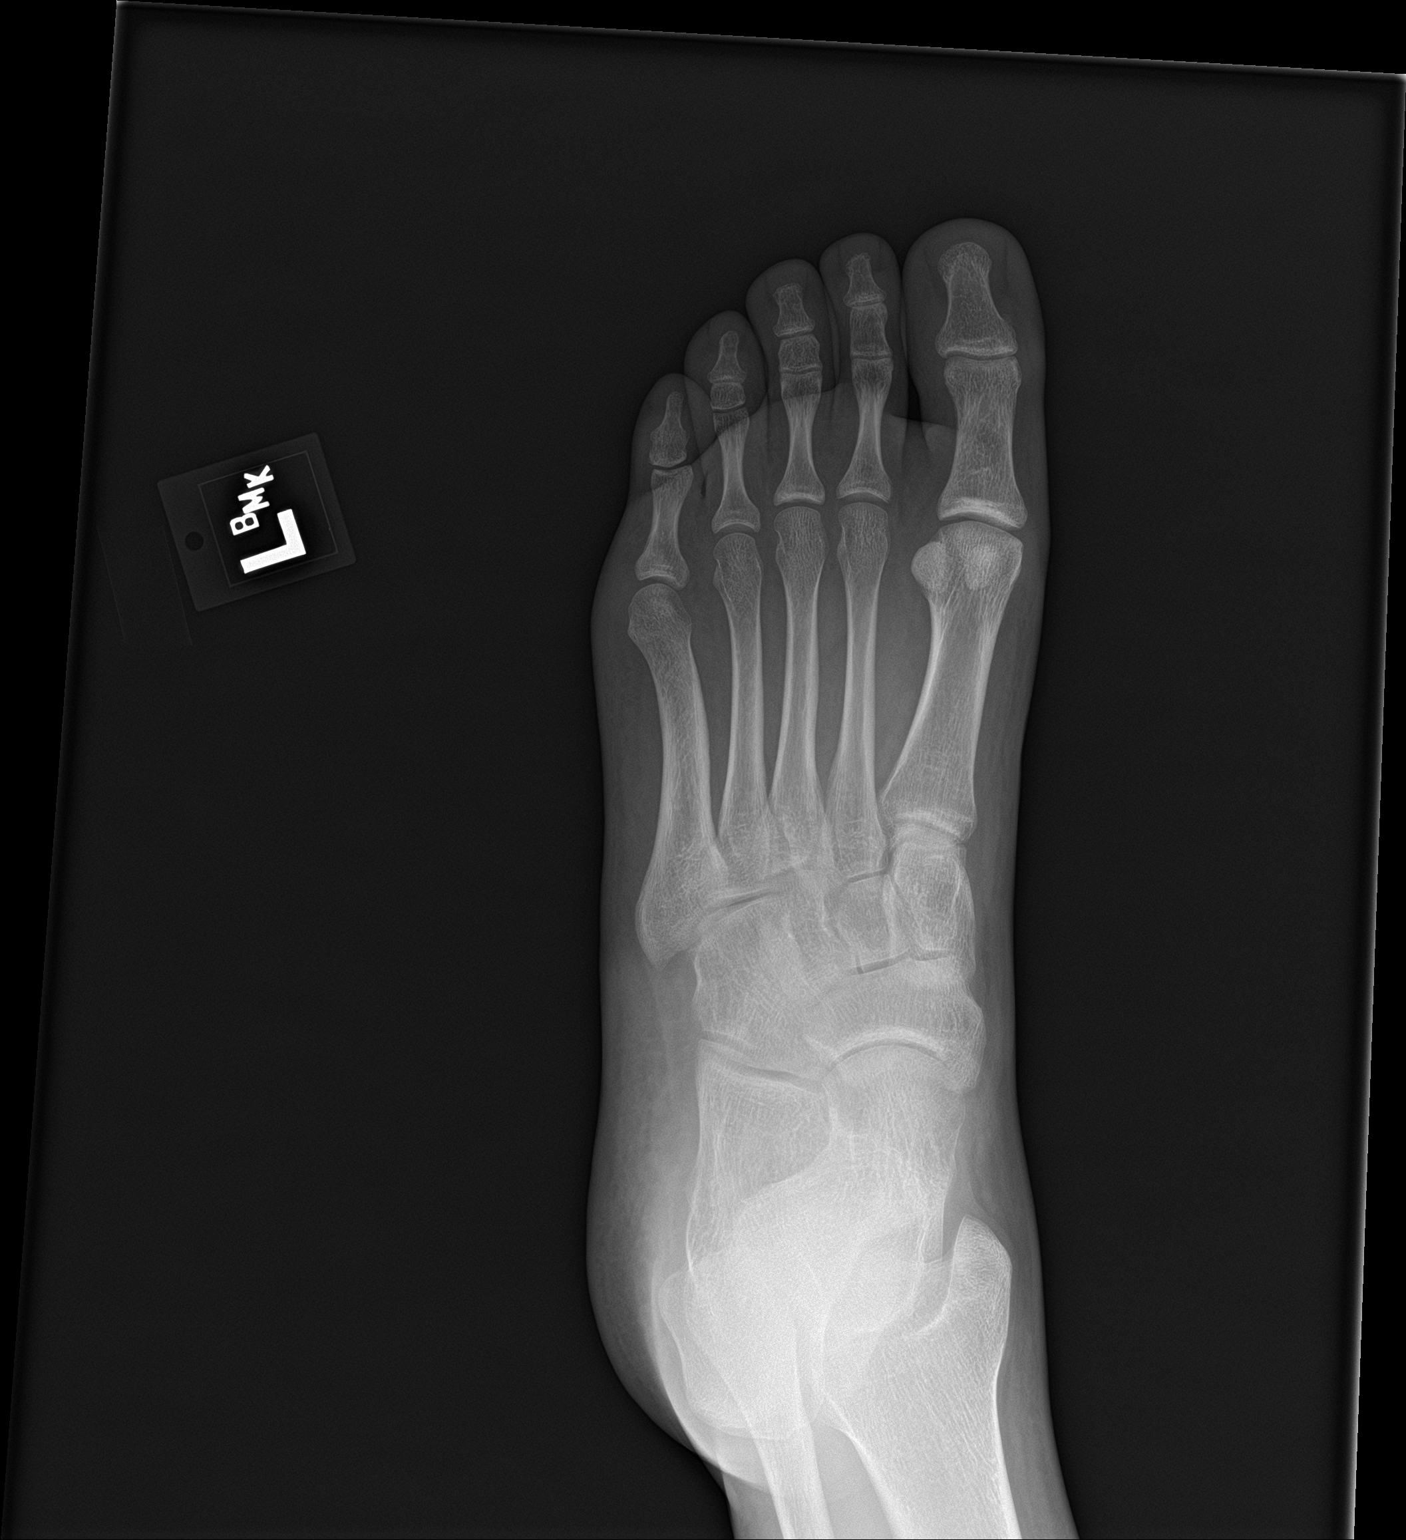

[foot lat]
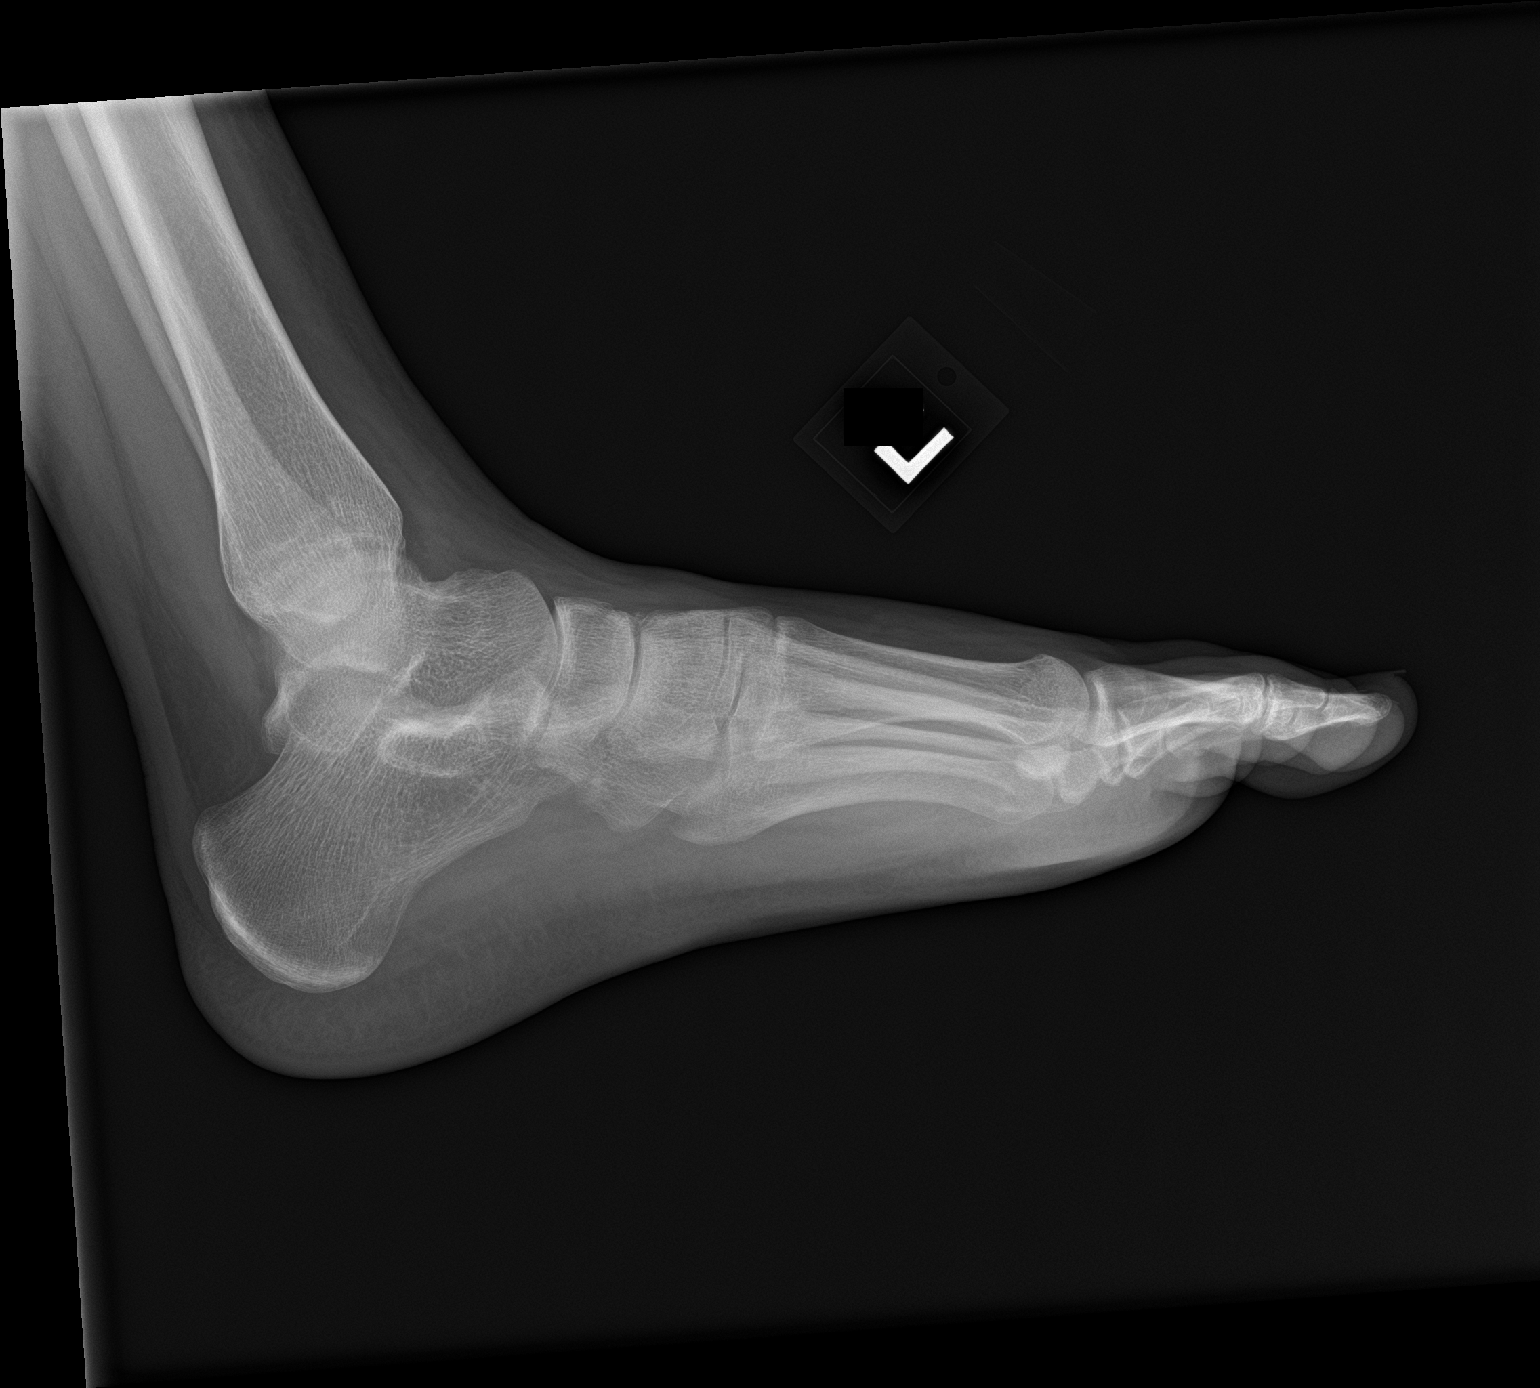

[2 of 2 positions shown; findings below may reference images not displayed]

FINDINGS: There is no evidence of fracture or dislocation. There is no
evidence of arthropathy or other focal bone abnormality. Soft
tissues are unremarkable. No retained radiopaque foreign body is
identified.
IMPRESSION: Negative.

## 2020-03-09 IMAGING — US US EXTREM LOW*L* LIMITED
1 series · 14 of 24 positions shown · non-contrast
Comparison: Radiography from earlier today

CLINICAL DATA: Discolored area at the lateral left foot extending
to the sole of foot with pain and swelling for 3 days, evaluate for
abscess

EXAM:
ULTRASOUND left LOWER EXTREMITY LIMITED
TECHNIQUE: Ultrasound examination of the lower extremity soft tissues was
performed in the area of clinical concern.

[Series 1: us left lower extrem ltd soft tissue non vascular · 24 acquisitions, 14 frames shown]
[im 1/24]
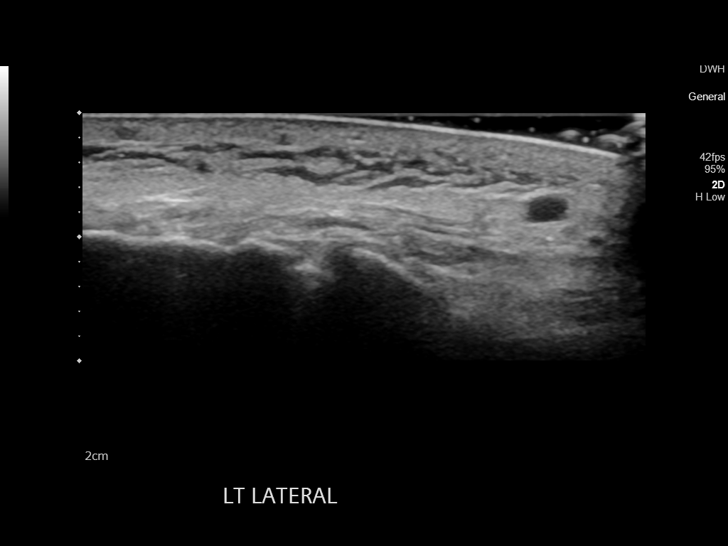
[im 3/24]
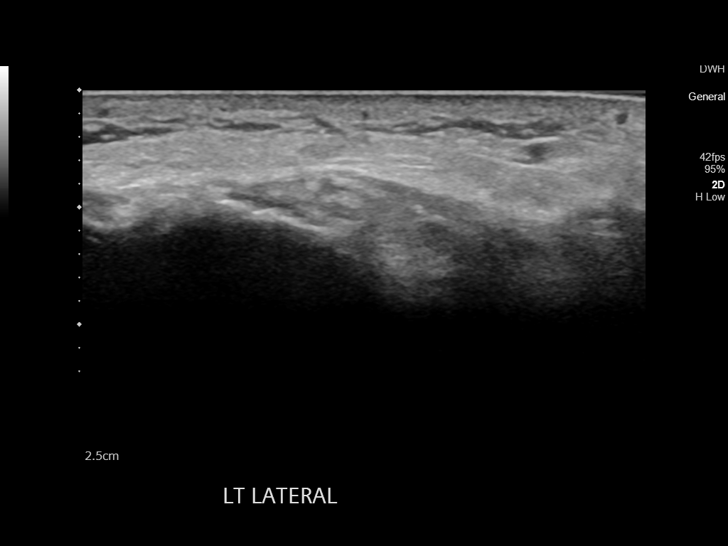
[im 5/24]
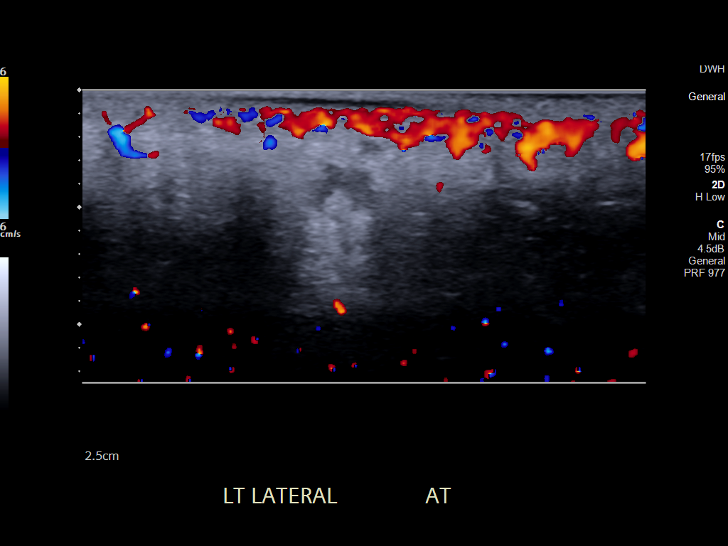
[im 7/24]
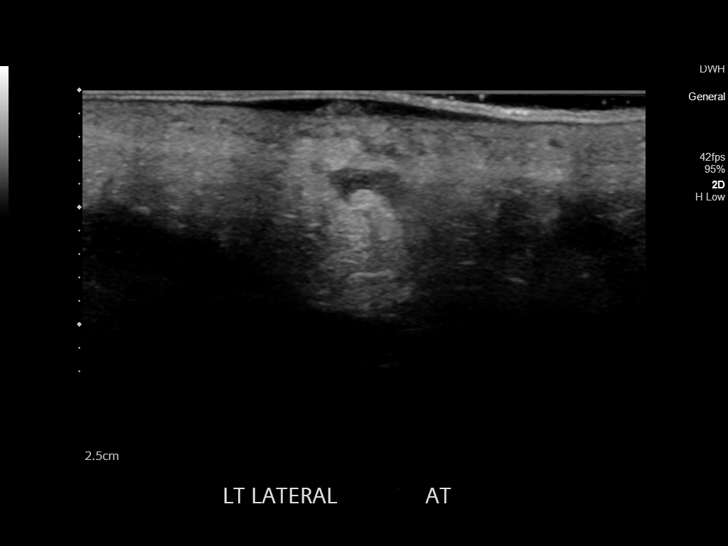
[im 8/24]
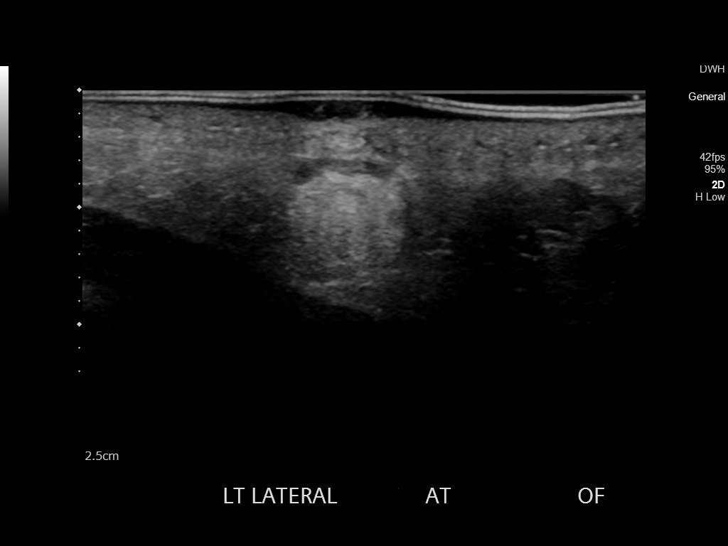
[im 10/24]
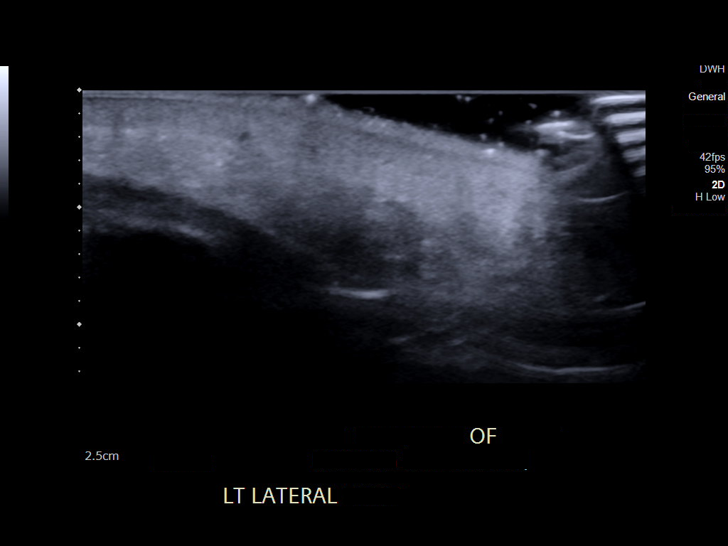
[im 12/24]
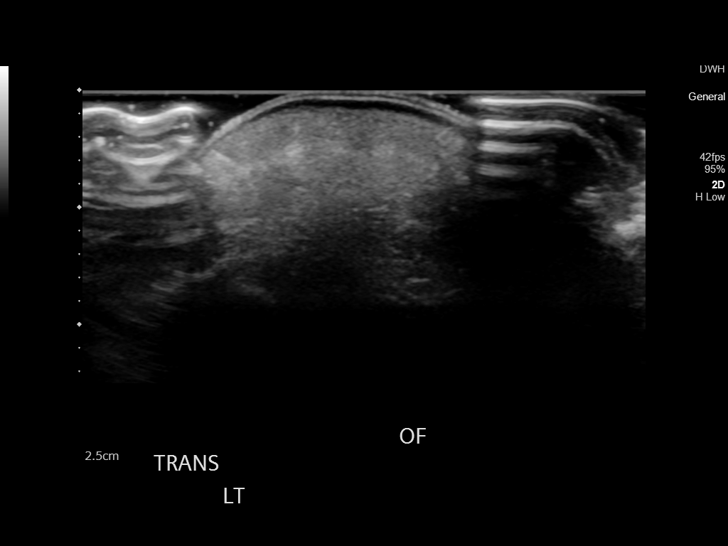
[im 13/24]
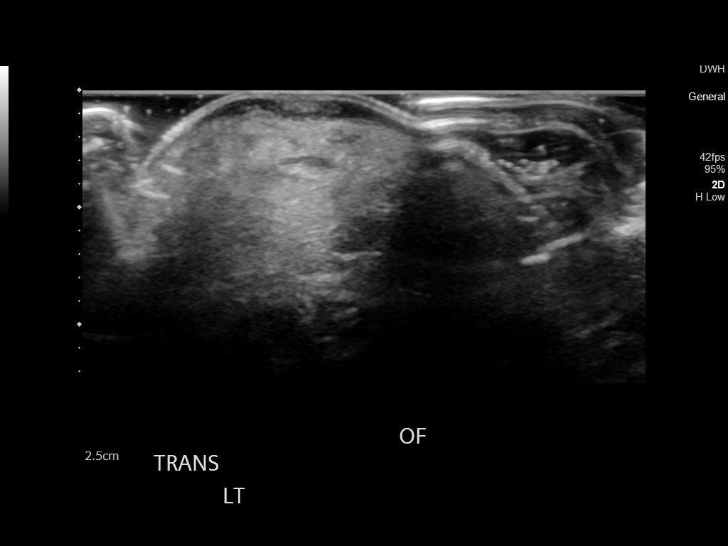
[im 15/24]
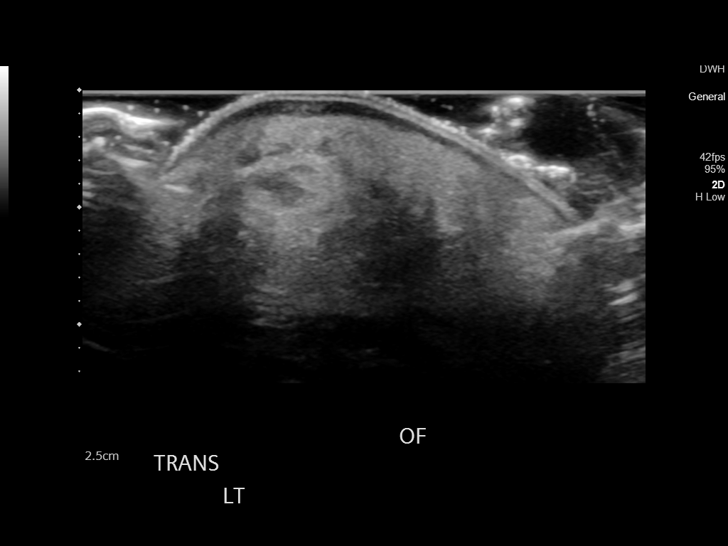
[im 17/24]
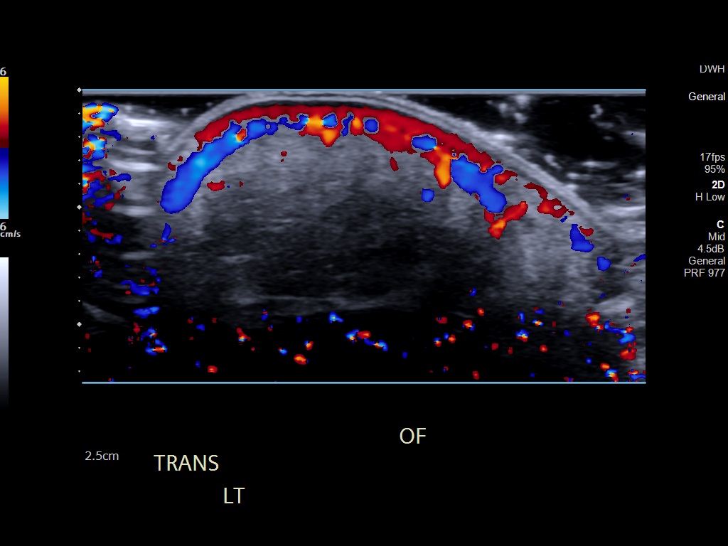
[im 19/24]
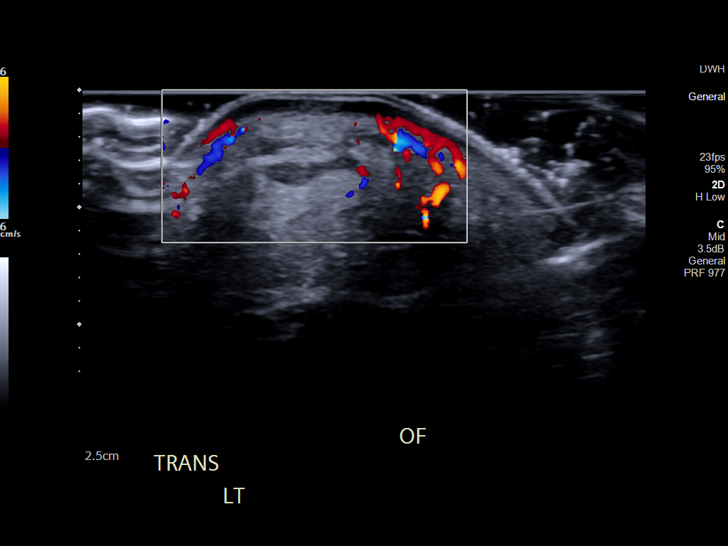
[im 20/24]
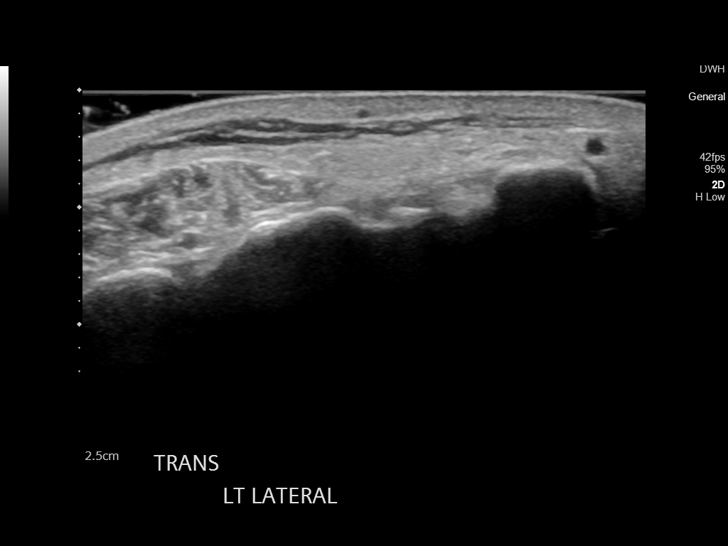
[im 22/24]
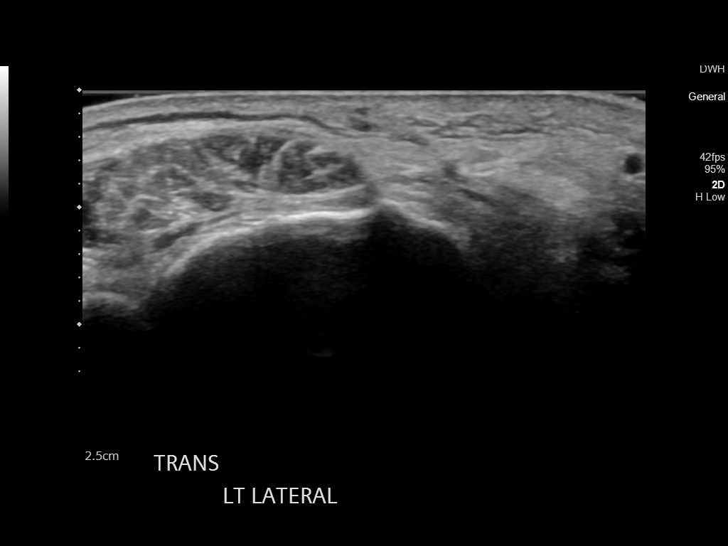
[im 24/24]
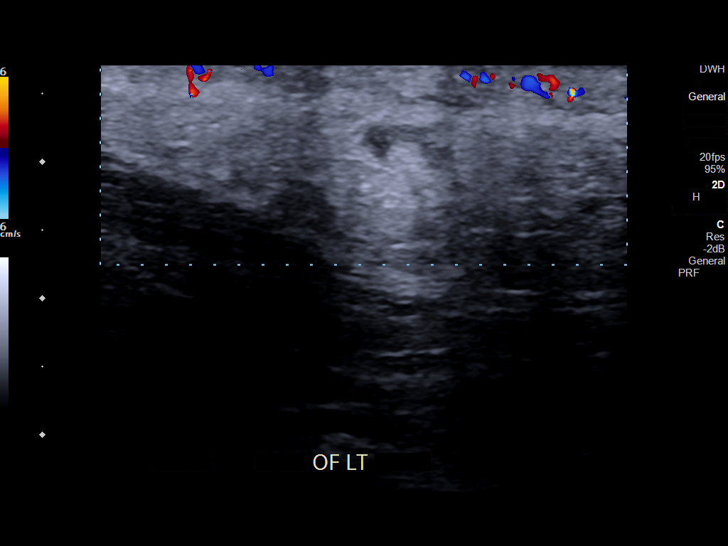

[14 of 24 positions shown; findings below may reference images not displayed]

FINDINGS: Subcutaneous edema in the symptomatic foot with a small subcutaneous
fluid collection measuring 5 mm. Increased echogenicity below this
may be increased through transmission. No foreign body on the
radiograph and no shadowing typical of foreign body on this study.
IMPRESSION: Edema and 5 mm subcutaneous fluid collection at the level of the
symptomatic foot.

## 2020-03-09 MED ORDER — MORPHINE SULFATE (PF) 4 MG/ML IV SOLN: 4.0000 mg | Freq: Once | INTRAVENOUS | Status: AC

## 2020-03-09 MED ORDER — INSULIN ASPART 100 UNIT/ML ~~LOC~~ SOLN
10.0000 [IU] | Freq: Once | SUBCUTANEOUS | Status: AC
  Administered 2020-03-09: 10 [IU] via INTRAVENOUS
  Filled 2020-03-09: qty 1

## 2020-03-09 MED ORDER — CLINDAMYCIN PHOSPHATE 600 MG/50ML IV SOLN
600.0000 mg | Freq: Once | INTRAVENOUS | Status: AC
  Administered 2020-03-09: 600 mg via INTRAVENOUS
  Filled 2020-03-09: qty 50

## 2020-03-09 MED ORDER — ONDANSETRON HCL 4 MG/2ML IJ SOLN
INTRAMUSCULAR | Status: AC
  Administered 2020-03-09: 4 mg via INTRAVENOUS
  Filled 2020-03-09: qty 2

## 2020-03-09 MED ORDER — MORPHINE SULFATE (PF) 4 MG/ML IV SOLN
INTRAVENOUS | Status: AC
  Filled 2020-03-09: qty 1

## 2020-03-09 MED ORDER — SODIUM CHLORIDE 0.9 % IV BOLUS
1000.0000 mL | Freq: Once | INTRAVENOUS | Status: AC
  Administered 2020-03-09: 1000 mL via INTRAVENOUS

## 2020-03-09 MED ORDER — CLINDAMYCIN HCL 300 MG PO CAPS: 300.0000 mg | ORAL_CAPSULE | Freq: Three times a day (TID) | ORAL | 0 refills | Status: AC

## 2020-03-09 MED ORDER — ONDANSETRON HCL 4 MG/2ML IJ SOLN: 4.0000 mg | Freq: Once | INTRAMUSCULAR | Status: AC

## 2020-03-09 MED ORDER — HYDROCODONE-ACETAMINOPHEN 5-325 MG PO TABS: 1.0000 | ORAL_TABLET | ORAL | 0 refills | Status: DC | PRN

## 2020-03-09 MED ORDER — MORPHINE SULFATE (PF) 4 MG/ML IV SOLN
4.0000 mg | Freq: Once | INTRAVENOUS | Status: AC
  Administered 2020-03-09: 4 mg via INTRAVENOUS

## 2020-03-09 MED ORDER — LIDOCAINE HCL (PF) 1 % IJ SOLN
5.0000 mL | Freq: Once | INTRAMUSCULAR | Status: AC
  Administered 2020-03-09: 5 mL
  Filled 2020-03-09: qty 5

## 2020-03-09 MED ORDER — MORPHINE SULFATE (PF) 4 MG/ML IV SOLN
INTRAVENOUS | Status: AC
  Administered 2020-03-09: 4 mg via INTRAVENOUS
  Filled 2020-03-09: qty 1

## 2020-03-09 NOTE — Discharge Instructions (Signed)
Please take your antibiotics as prescribed for their entire course. Please return to the emergency department for any worsening of swelling pain or if you develop a fever. Please follow-up with your doctor in the next 1 to 2 days for recheck/reevaluation.

## 2020-03-09 NOTE — ED Triage Notes (Signed)
Emergency Medicine Provider Triage Evaluation Note  Jesus Ewing , a 19 y.o. male  was evaluated in triage.  Pt complains of 2 weeks of pain and swelling to left foot with area of discoloration.  Review of Systems  Positive: Foot pain and swelling, rash Negative: Fever, nausea, vomiting  Physical Exam  BP 125/69 (BP Location: Left Arm)    Pulse 82    Temp 99.5 F (37.5 C) (Oral)    Resp 18    Ht 5\' 11"  (1.803 m)    Wt 72.3 kg    SpO2 96%    BMI 22.23 kg/m  Gen:   Awake, no distress   HEENT:  Atraumatic  Resp:  Normal effort  Cardiac:  Normal rate, regular rhythm Abd:   Nondistended, nontender  MSK:   Moves extremities without difficulty. Area of induration to left lateral foot, with central purulence and erythema extending up foot. Neuro:  Speech clear   Medical Decision Making  Medically screening exam initiated at 2:55 AM.  Appropriate orders placed.  Jesus Ewing was informed that the remainder of the evaluation will be completed by another provider, this initial triage assessment does not replace that evaluation, and the importance of remaining in the ED until their evaluation is complete.  Clinical Impression   Patient with pain and swelling to left foot for 2 weeks, exam concerning for cellulitis and possible central abscess to base of left foot. DVT unlikely as no apparent risk factors and patient with focal tenderness to left foot. Will further assess with XR and soft tissue Toni Arthurs as well as labs.   [place], MD 03/09/20 [phone]

## 2020-03-09 NOTE — ED Triage Notes (Signed)
Patient ambulatory to triage with steady gait, without difficulty or distress noted; pt reports are to left outer foot that is painful to touch and discolored; pt denies any known injury

## 2020-03-09 NOTE — ED Provider Notes (Signed)
[facility] Emergency Department Provider Note  Time seen: 10:53 AM  I have reviewed the triage vital signs and the nursing notes.   HISTORY  Chief Complaint Cellulitis   HPI Jesus Ewing is a 19 y.o. male with a past medical history of diabetes presents to the emergency department for left foot pain and swelling.  According to the patient over the past for 5 days he has been experiencing pain and swelling in his left foot to the point where it is difficult to walk due to the pain.  Patient denies any fever, no vomiting or diarrhea.  Denies any known penetrating injury or cut.   Past Medical History:  Diagnosis Date  . Celiac disease   . Diabetes mellitus without complication [facility])     Patient Active Problem List   Diagnosis Date Noted  . DKA (diabetic ketoacidoses) (HCC) 05/26/2019  . DKA, type 1 (HCC) 06/19/2016  . Celiac disease 06/19/2016    Past Surgical History:  Procedure Laterality Date  . NO PAST SURGERIES      Prior to Admission medications   Medication Sig Start Date End Date Taking? Authorizing Provider  insulin aspart (NOVOLOG) 100 UNIT/ML injection Inject 1-8 Units into the skin 3 (three) times daily before meals. Using a sliding scale. 1 unit per 25 over 100 blood glucose reading.    [provider]  insulin aspart protamine- aspart (NOVOLOG MIX 70/30) (70-30) 100 UNIT/ML injection Inject 44 Units into the skin 2 (two) times daily with a meal.     [provider]  insulin lispro (HUMALOG) 100 UNIT/ML KwikPen Inject 0-50 Units into the skin as directed. 07/24/18   [provider]  LANTUS SOLOSTAR 100 UNIT/ML Solostar Pen Inject 30 Units into the skin at bedtime. 05/04/19   [provider]    No Known Allergies  Family History  Family history unknown: Yes    Social History Social History   Tobacco Use  . Smoking status: Never Smoker  . Smokeless tobacco: Never Used  Vaping Use  .  Vaping Use: Every day  Substance Use Topics  . Alcohol use: No  . Drug use: No    Review of Systems Constitutional: Negative for fever. Cardiovascular: Negative for chest pain. Respiratory: Negative for shortness of breath. Gastrointestinal: Negative for abdominal pain Musculoskeletal: Left foot pain and swelling Skin: Redness around the left foot Neurological: Negative for headache All other ROS negative  ____________________________________________   PHYSICAL EXAM:  VITAL SIGNS: ED Triage Vitals  Enc Vitals Group     BP 03/09/20 0146 125/69     Pulse Rate 03/09/20 0146 82     Resp 03/09/20 0146 18     Temp 03/09/20 0146 99.5 F (37.5 C)     Temp Source 03/09/20 0146 Oral     SpO2 03/09/20 0146 96 %     Weight 03/09/20 0146 159 lb 6.3 oz (72.3 kg)     Height 03/09/20 0146 5\' 11"  (1.803 m)     Head Circumference --      Peak Flow --      Pain Score 03/09/20 0204 6     Pain Loc --      Pain Edu? --      Excl. in GC? --     Constitutional: Alert and oriented. Well appearing and in no distress. Eyes: Normal exam ENT      Head: Normocephalic and atraumatic.      Mouth/Throat: Mucous membranes are moist. Cardiovascular:  Normal rate, regular rhythm. Respiratory: Normal respiratory effort without tachypnea nor retractions. Breath sounds are clear Gastrointestinal: Soft and nontender. No distention.  Musculoskeletal: Patient has a focal area of fluctuance to the plantar lateral aspect of his left foot with surrounding erythema and mild edema extending to the dorsal aspect of the left foot. Neurologic:  Normal speech and language. No gross focal neurologic deficits  Skin: Mild erythema of the lateral aspect of the left foot. Psychiatric: Mood and affect are normal.  ____________________________________________     RADIOLOGY  X-rays negative Ultrasound shows edema with 5 mm subcutaneous fluid on the base left  foot.  ____________________________________________   INITIAL IMPRESSION / ASSESSMENT AND PLAN / ED COURSE  Pertinent labs & imaging results that were available during my care of the patient were reviewed by me and considered in my medical decision making (see chart for details).   Patient presents emergency department for pain and swelling of his left foot.  Patient is diabetic found to be quite hyperglycemic although the patient states he ate cookout just before arrival and had not taken insulin.  Patient's blood sugars greater than 500.  On examination patient has a small area of fluctuance consistent with a small abscess with surrounding erythema consistent with surrounding cellulitis, mild to moderate in severity.  Reassuringly patient's white blood cell count is normal.  Lactic is normal.  Ultrasound also shows a 5 mm abscess with surrounding edema.  I was able to incise and drain the small abscess with approximate 3 cc of exudate.  We will dose IV fluids, IV insulin as well as a round of IV clindamycin.  Overall the patient appears quite well and anticipate likely discharge home once his blood sugars normalized.  We will place patient on clindamycin 3 times daily for the next 10 days.  I discussed the importance of glucose control at home.  Patient agreeable to plan of care.  Discussed return precautions.  Patient's blood glucose is down to 196. Patient is requesting food we will feed the patient. Patient will be discharged once his clindamycin has finished infusing. We will discharge on the same orally.   INCISION AND DRAINAGE Performed by: Minna Antis Consent: Verbal consent obtained. Risks and benefits: risks, benefits and alternatives were discussed Type: abscess  Body area: Lateral plantar aspect of left foot  Anesthesia: local infiltration  Incision was made with a scalpel.  Local anesthetic: lidocaine 1% without epinephrine  Anesthetic total: 1 ml  Complexity:  Simple  Drainage: purulent  Drainage amount: 2 to 3 cc   Patient tolerance: Patient tolerated the procedure well with no immediate complications.     Jesus Ewing was evaluated in Emergency Department on 03/09/2020 for the symptoms described in the history of present illness. He was evaluated in the context of the global COVID-19 pandemic, which necessitated consideration that the patient might be at risk for infection with the SARS-CoV-2 virus that causes COVID-19. Institutional protocols and algorithms that pertain to the evaluation of patients at risk for COVID-19 are in a state of rapid change based on information released by regulatory bodies including the CDC and federal and state organizations. These policies and algorithms were followed during the patient's care in the ED.  ____________________________________________   FINAL CLINICAL IMPRESSION(S) / ED DIAGNOSES  Abscess Cellulitis Hyperglycemia   Minna Antis, MD 03/09/20 1249

## 2021-02-14 ENCOUNTER — Observation Stay: Payer: 59

## 2021-02-14 ENCOUNTER — Encounter: Payer: Self-pay | Admitting: *Deleted

## 2021-02-14 ENCOUNTER — Inpatient Hospital Stay: Payer: 59

## 2021-02-14 ENCOUNTER — Observation Stay
Admission: EM | Admit: 2021-02-14 | Discharge: 2021-02-15 | Disposition: A | Discharge status: Home / Self Care | Payer: 59 | Attending: Internal Medicine | Admitting: Internal Medicine

## 2021-02-14 ENCOUNTER — Other Ambulatory Visit: Payer: Self-pay

## 2021-02-14 DIAGNOSIS — E119 Type 2 diabetes mellitus without complications: Secondary | ICD-10-CM | POA: Diagnosis not present

## 2021-02-14 DIAGNOSIS — Z794 Long term (current) use of insulin: Secondary | ICD-10-CM | POA: Diagnosis not present

## 2021-02-14 DIAGNOSIS — N179 Acute kidney failure, unspecified: Secondary | ICD-10-CM | POA: Insufficient documentation

## 2021-02-14 DIAGNOSIS — F419 Anxiety disorder, unspecified: Secondary | ICD-10-CM | POA: Diagnosis present

## 2021-02-14 DIAGNOSIS — T43211A Poisoning by selective serotonin and norepinephrine reuptake inhibitors, accidental (unintentional), initial encounter: Secondary | ICD-10-CM | POA: Insufficient documentation

## 2021-02-14 DIAGNOSIS — Z20822 Contact with and (suspected) exposure to covid-19: Secondary | ICD-10-CM | POA: Insufficient documentation

## 2021-02-14 DIAGNOSIS — E1110 Type 2 diabetes mellitus with ketoacidosis without coma: Secondary | ICD-10-CM | POA: Diagnosis present

## 2021-02-14 DIAGNOSIS — E1010 Type 1 diabetes mellitus with ketoacidosis without coma: Secondary | ICD-10-CM | POA: Diagnosis not present

## 2021-02-14 DIAGNOSIS — F1410 Cocaine abuse, uncomplicated: Secondary | ICD-10-CM | POA: Diagnosis not present

## 2021-02-14 DIAGNOSIS — F32A Depression, unspecified: Secondary | ICD-10-CM | POA: Diagnosis present

## 2021-02-14 DIAGNOSIS — R569 Unspecified convulsions: Secondary | ICD-10-CM | POA: Diagnosis present

## 2021-02-14 DIAGNOSIS — G40909 Epilepsy, unspecified, not intractable, without status epilepticus: Secondary | ICD-10-CM

## 2021-02-14 LAB — BASIC METABOLIC PANEL
Anion gap: 27 — ABNORMAL HIGH (ref 5–15)
Anion gap: 13 (ref 5–15)
Anion gap: 10 (ref 5–15)
Anion gap: 8 (ref 5–15)
BUN: 22 mg/dL — ABNORMAL HIGH (ref 6–20)
BUN: 15 mg/dL (ref 6–20)
BUN: 15 mg/dL (ref 6–20)
BUN: 13 mg/dL (ref 6–20)
CO2: 9 mmol/L — ABNORMAL LOW (ref 22–32)
CO2: 15 mmol/L — ABNORMAL LOW (ref 22–32)
CO2: 18 mmol/L — ABNORMAL LOW (ref 22–32)
CO2: 21 mmol/L — ABNORMAL LOW (ref 22–32)
Calcium: 8.9 mg/dL (ref 8.9–10.3)
Calcium: 8 mg/dL — ABNORMAL LOW (ref 8.9–10.3)
Calcium: 8 mg/dL — ABNORMAL LOW (ref 8.9–10.3)
Calcium: 8.6 mg/dL — ABNORMAL LOW (ref 8.9–10.3)
Chloride: 99 mmol/L (ref 98–111)
Chloride: 106 mmol/L (ref 98–111)
Chloride: 106 mmol/L (ref 98–111)
Chloride: 108 mmol/L (ref 98–111)
Creatinine, Ser: 1.26 mg/dL — ABNORMAL HIGH (ref 0.61–1.24)
Creatinine, Ser: 0.79 mg/dL (ref 0.61–1.24)
Creatinine, Ser: 0.84 mg/dL (ref 0.61–1.24)
Creatinine, Ser: 0.6 mg/dL — ABNORMAL LOW (ref 0.61–1.24)
GFR, Estimated: >60 mL/min (ref >60)
GFR, Estimated: >60 mL/min (ref >60)
GFR, Estimated: >60 mL/min (ref >60)
GFR, Estimated: >60 mL/min (ref >60)
Glucose, Bld: 433 mg/dL — ABNORMAL HIGH (ref 70–99)
Glucose, Bld: 160 mg/dL — ABNORMAL HIGH (ref 70–99)
Glucose, Bld: 140 mg/dL — ABNORMAL HIGH (ref 70–99)
Glucose, Bld: 137 mg/dL — ABNORMAL HIGH (ref 70–99)
Potassium: 5 mmol/L (ref 3.5–5.1)
Potassium: 3.9 mmol/L (ref 3.5–5.1)
Potassium: 3.7 mmol/L (ref 3.5–5.1)
Potassium: 3.7 mmol/L (ref 3.5–5.1)
Sodium: 135 mmol/L (ref 135–145)
Sodium: 134 mmol/L — ABNORMAL LOW (ref 135–145)
Sodium: 134 mmol/L — ABNORMAL LOW (ref 135–145)
Sodium: 137 mmol/L (ref 135–145)

## 2021-02-14 LAB — URINE DRUG SCREEN, QUALITATIVE (ARMC ONLY)
Amphetamines, Ur Screen: POSITIVE — AB
Barbiturates, Ur Screen: NOT DETECTED
Benzodiazepine, Ur Scrn: NOT DETECTED
Cannabinoid 50 Ng, Ur ~~LOC~~: NOT DETECTED
Cocaine Metabolite,Ur ~~LOC~~: POSITIVE — AB
MDMA (Ecstasy)Ur Screen: NOT DETECTED
Methadone Scn, Ur: NOT DETECTED
Opiate, Ur Screen: NOT DETECTED
Phencyclidine (PCP) Ur S: NOT DETECTED
Tricyclic, Ur Screen: NOT DETECTED

## 2021-02-14 LAB — URINALYSIS, COMPLETE (UACMP) WITH MICROSCOPIC
Bacteria, UA: NONE SEEN
Bilirubin Urine: NEGATIVE
Glucose, UA: >=500 mg/dL — AB
Hgb urine dipstick: NEGATIVE
Ketones, ur: 80 mg/dL — AB
Leukocytes,Ua: NEGATIVE
Nitrite: NEGATIVE
Protein, ur: NEGATIVE mg/dL
Specific Gravity, Urine: 1.024 (ref 1.005–1.030)
Squamous Epithelial / HPF: NONE SEEN (ref 0–5)
pH: 5 (ref 5.0–8.0)

## 2021-02-14 LAB — BASIC METABOLIC PANEL WITH GFR
Anion gap: 13 (ref 5–15)
BUN: 16 mg/dL (ref 6–20)
CO2: 16 mmol/L — ABNORMAL LOW (ref 22–32)
Calcium: 8 mg/dL — ABNORMAL LOW (ref 8.9–10.3)
Chloride: 105 mmol/L (ref 98–111)
Creatinine, Ser: 0.92 mg/dL (ref 0.61–1.24)
GFR, Estimated: >60 mL/min
Glucose, Bld: 166 mg/dL — ABNORMAL HIGH (ref 70–99)
Potassium: 4.1 mmol/L (ref 3.5–5.1)
Sodium: 134 mmol/L — ABNORMAL LOW (ref 135–145)

## 2021-02-14 LAB — CBC
HCT: 49.1 % (ref 39.0–52.0)
Hemoglobin: 16.9 g/dL (ref 13.0–17.0)
MCH: 33.9 pg (ref 26.0–34.0)
MCHC: 34.4 g/dL (ref 30.0–36.0)
MCV: 98.6 fL (ref 80.0–100.0)
Platelets: 307 10*3/uL (ref 150–400)
RBC: 4.98 MIL/uL (ref 4.22–5.81)
RDW: 11.3 % — ABNORMAL LOW (ref 11.5–15.5)
WBC: 9.4 10*3/uL (ref 4.0–10.5)
nRBC: 0 % (ref 0.0–0.2)

## 2021-02-14 LAB — CBG MONITORING, ED
Glucose-Capillary: 427 mg/dL — ABNORMAL HIGH (ref 70–99)
Glucose-Capillary: 377 mg/dL — ABNORMAL HIGH (ref 70–99)
Glucose-Capillary: 221 mg/dL — ABNORMAL HIGH (ref 70–99)
Glucose-Capillary: 162 mg/dL — ABNORMAL HIGH (ref 70–99)
Glucose-Capillary: 150 mg/dL — ABNORMAL HIGH (ref 70–99)
Glucose-Capillary: 157 mg/dL — ABNORMAL HIGH (ref 70–99)
Glucose-Capillary: 136 mg/dL — ABNORMAL HIGH (ref 70–99)
Glucose-Capillary: 140 mg/dL — ABNORMAL HIGH (ref 70–99)
Glucose-Capillary: 164 mg/dL — ABNORMAL HIGH (ref 70–99)
Glucose-Capillary: 142 mg/dL — ABNORMAL HIGH (ref 70–99)
Glucose-Capillary: 119 mg/dL — ABNORMAL HIGH (ref 70–99)

## 2021-02-14 LAB — RESP PANEL BY RT-PCR (FLU A&B, COVID) ARPGX2
Influenza A by PCR: NEGATIVE
Influenza B by PCR: NEGATIVE
SARS Coronavirus 2 by RT PCR: NEGATIVE

## 2021-02-14 LAB — HIV ANTIBODY (ROUTINE TESTING W REFLEX): HIV Screen 4th Generation wRfx: NONREACTIVE

## 2021-02-14 LAB — GLUCOSE, CAPILLARY: Glucose-Capillary: 279 mg/dL — ABNORMAL HIGH (ref 70–99)

## 2021-02-14 LAB — BETA-HYDROXYBUTYRIC ACID: Beta-Hydroxybutyric Acid: >8 mmol/L — ABNORMAL HIGH (ref 0.05–0.27)

## 2021-02-14 IMAGING — CT CT CERVICAL SPINE W/O CM
3 of 4 series · 11 of 33 positions shown, 13 images · non-contrast
Comparison: Cervical spine radiographs [DATE].

CLINICAL DATA: Seizure, abnormal neuro exam. Neck trauma, focal
neuro deficit or paresthesia (age 16-64y).

EXAM:
CT HEAD WITHOUT CONTRAST
CT CERVICAL SPINE WITHOUT CONTRAST
TECHNIQUE: Multidetector CT imaging of the head and cervical spine was
performed following the standard protocol without intravenous
contrast. Multiplanar CT image reconstructions of the cervical spine
were also generated.

[Series 4: sagittal bone · sagittal · 0.27mm/px · 5 of 61 slices shown, 6 images]
[im 21/61  bone]
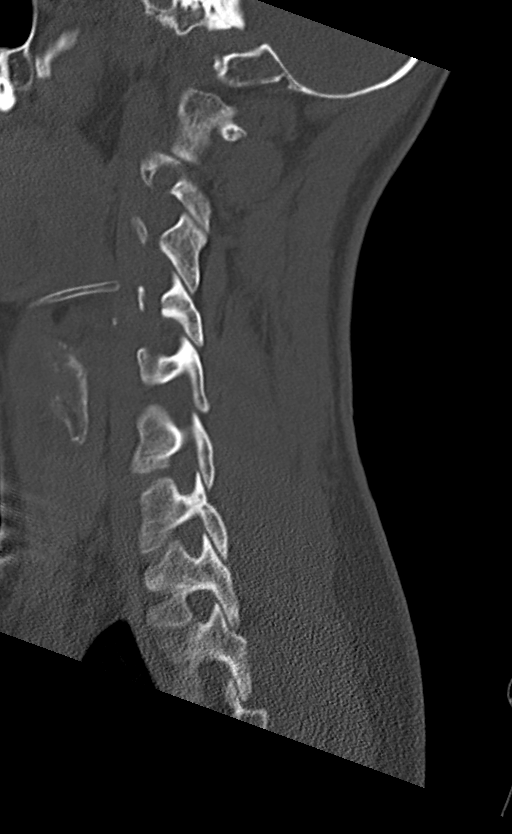
[im 26/61  bone]
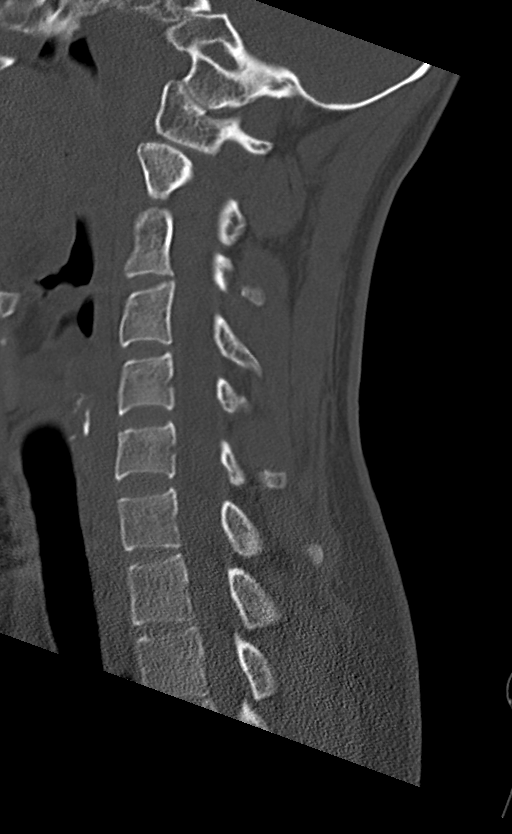
[im 31/61  soft-tissue]
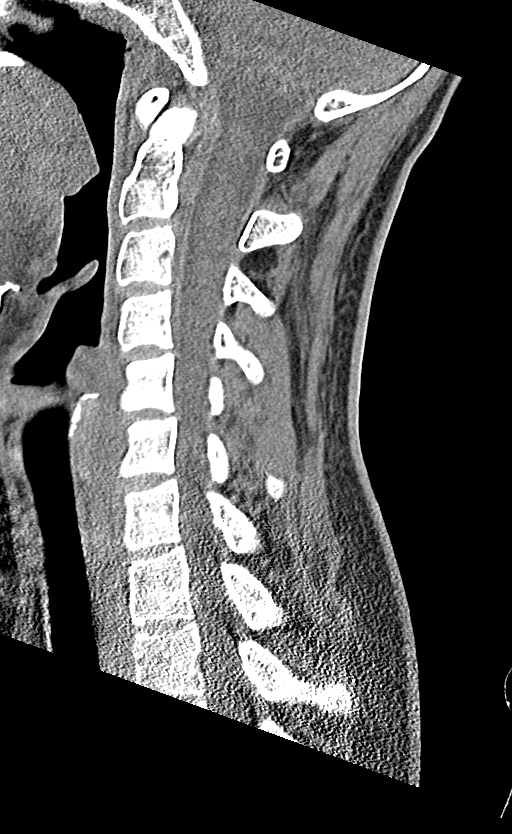
[im 31/61  bone]
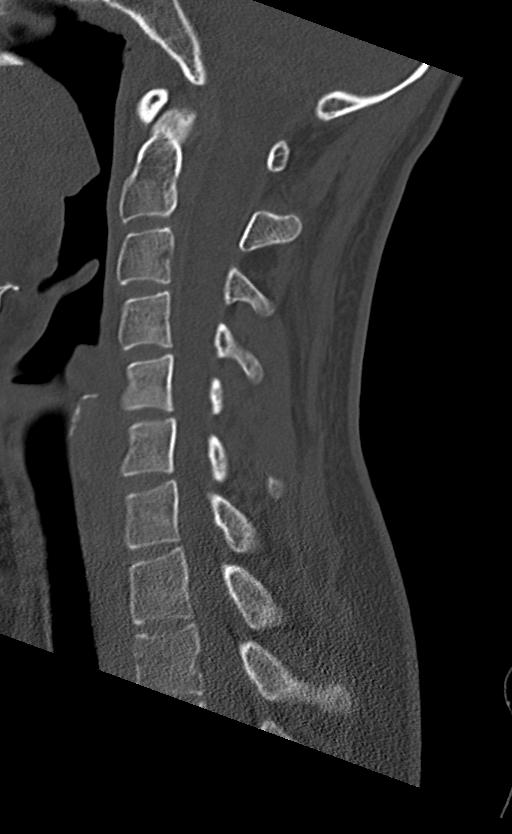
[im 36/61  bone]
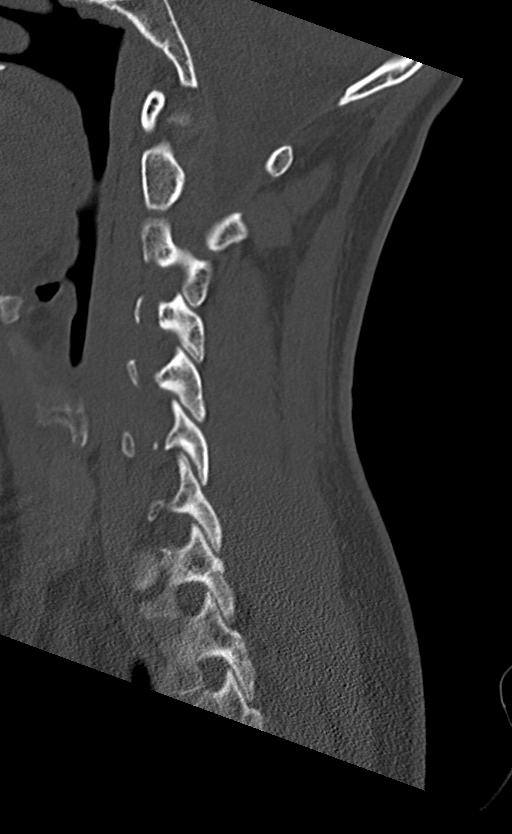
[im 41/61  bone]
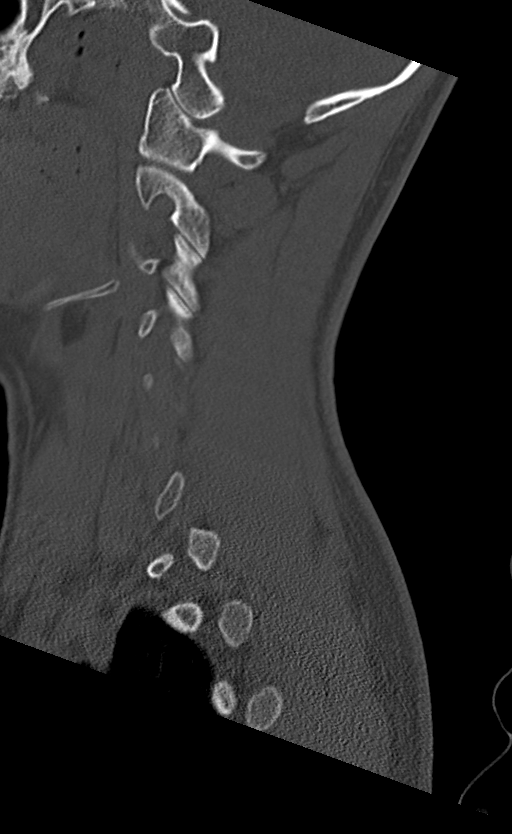

[Series 5: coronal bone · coronal · 0.27mm/px · 3 of 61 slices shown]
[im 13/61  bone]
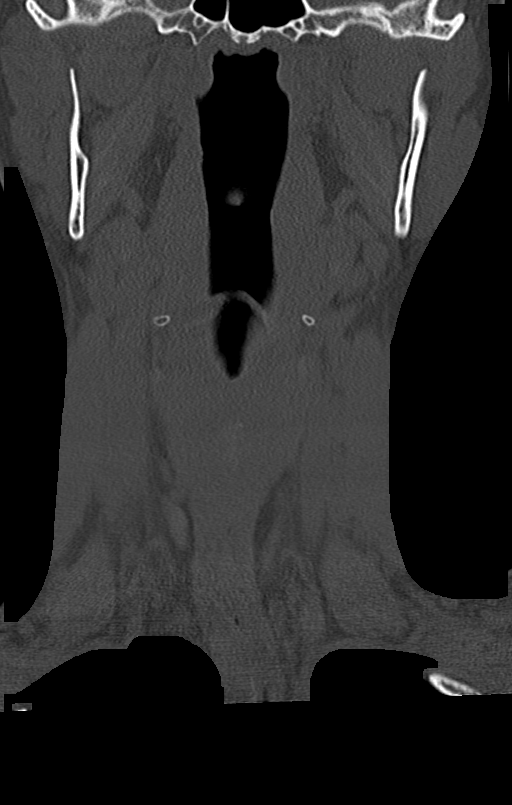
[im 25/61  bone]
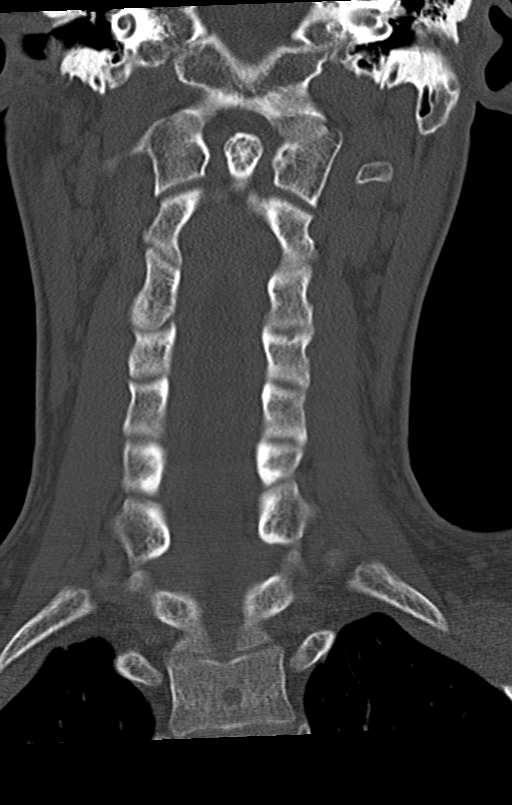
[im 37/61  bone]
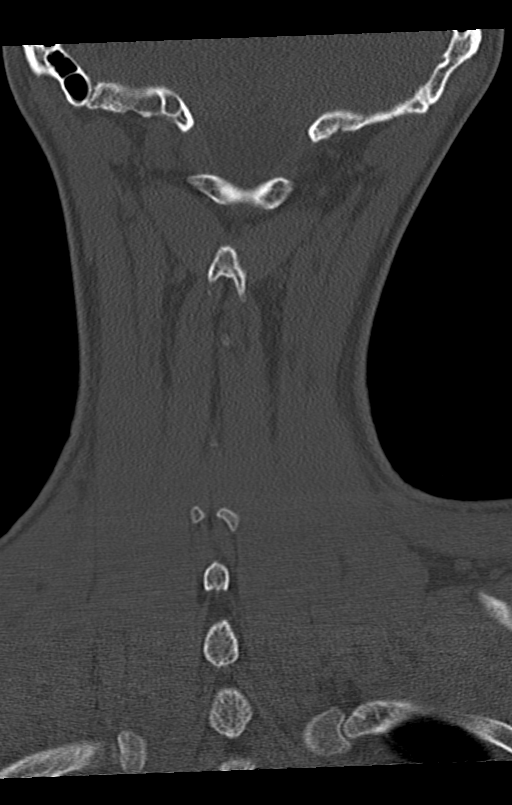

[Series 6: orthogonal bone · axial · 0.23mm/px · z∈[+154,+283]mm · 3 of 103 slices shown, 4 images]
[im 18/103  soft-tissue]
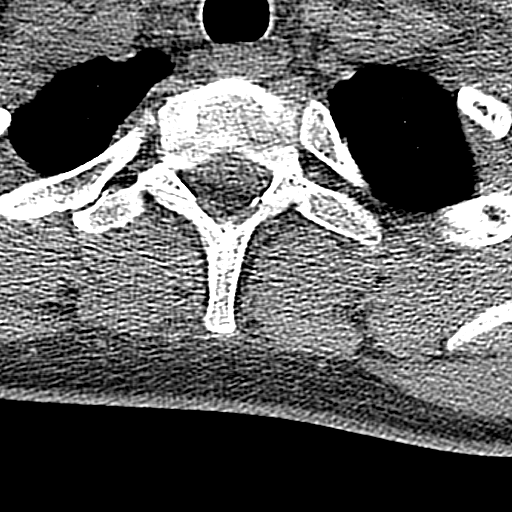
[im 18/103  bone]
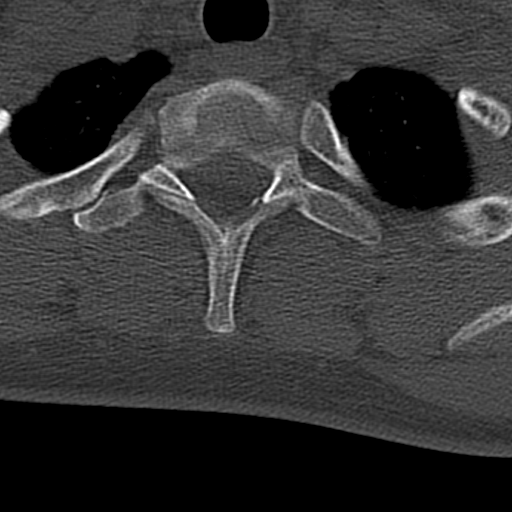
[im 52/103  bone]
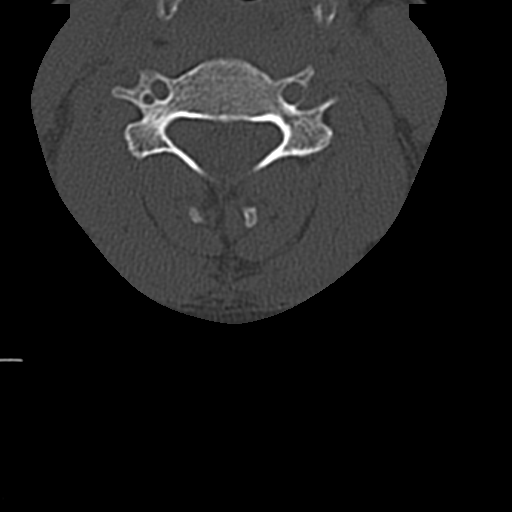
[im 86/103  bone]
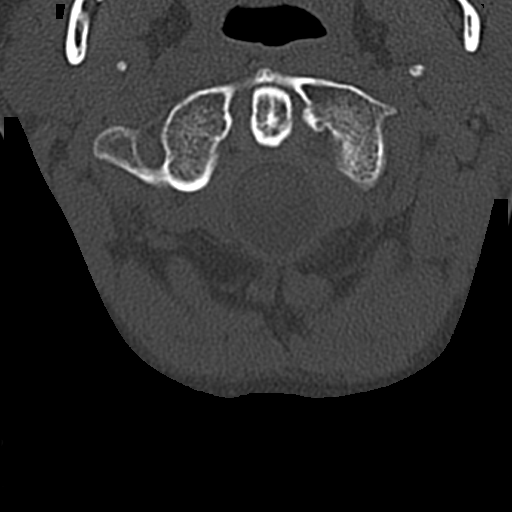

[11 of 33 positions shown; findings below may reference images not displayed]

FINDINGS: CT HEAD FINDINGS

Brain:

Cerebral volume is normal.

1.8 x 1.4 x 1.4 cm hyperdense and partially calcified cortically
based mass/lesion within the posterior left frontal lobe.

There is no acute intracranial hemorrhage.

No demarcated cortical infarct.

No extra-axial fluid collection.

No midline shift.

Vascular: No hyperdense vessel.

Skull: Normal. Negative for fracture or focal lesion.

Sinuses/Orbits: Visualized orbits show no acute finding. No
significant paranasal sinus disease at the imaged levels.

CT CERVICAL SPINE FINDINGS

Alignment: Cervical dextrocurvature. Straightening of the expected
cervical lordosis. No significant spondylolisthesis.

Skull base and vertebrae: The basion-dental and atlanto-dental
intervals are maintained.No evidence of acute fracture to the
cervical spine.

Soft tissues and spinal canal: No prevertebral fluid or swelling. No
visible canal hematoma.

Disc levels: No significant bony spinal canal or neural foraminal
narrowing.

Upper chest: No consolidation within the imaged lung apices. No
visible pneumothorax.

CT head results and recommendations called by telephone at the time
of interpretation on [DATE] at [DATE] to provider PELON
, who verbally acknowledged these results.
IMPRESSION: CT head:

1. 1.8 x 1.4 cm hyperdense and partially calcified cortically-based
mass/lesion within the left frontal lobe. A contrast-enhanced brain
MRI is recommended for further characterization.
2. Otherwise unremarkable non-contrast CT appearance of the brain.

CT cervical spine:

1. No evidence of acute fracture to the cervical spine.
2. Nonspecific straightening of the expected cervical lordosis.
3. Cervical dextrocurvature.

## 2021-02-14 IMAGING — MR MR HEAD WO/W CM
14 series · 47 of 48 positions shown · IV contrast (gadavist)
Comparison: None.

CLINICAL DATA: Brain mass or lesion.  Seizure-like activity.

EXAM:
MRI HEAD WITHOUT AND WITH CONTRAST
TECHNIQUE: Multiplanar, multiecho pulse sequences of the brain and surrounding
structures were obtained without and with intravenous contrast.
CONTRAST:  7mL GADAVIST GADOBUTROL 1 MMOL/ML IV SOLN

[Series 5: ax dwi_tracew · axial · 3.0mm · 0.65mm/px · z∈[-89,+65]mm · 4 of 48 slices shown]
[im 1/48]
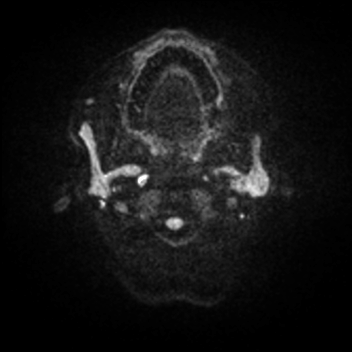
[im 16/48]
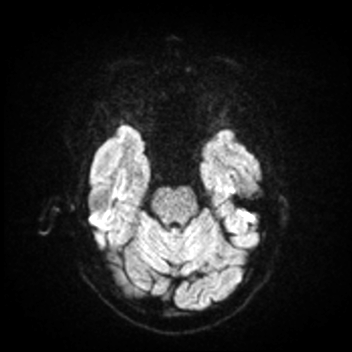
[im 32/48]
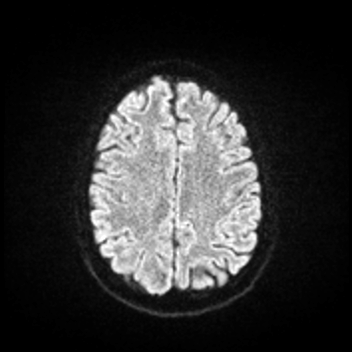
[im 48/48]
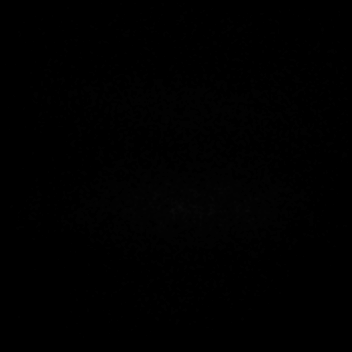

[Series 6: ax dwi_adc · axial · 3.0mm · 0.65mm/px · z∈[-89,+62]mm · 3 of 47 slices shown]
[im 1/47]
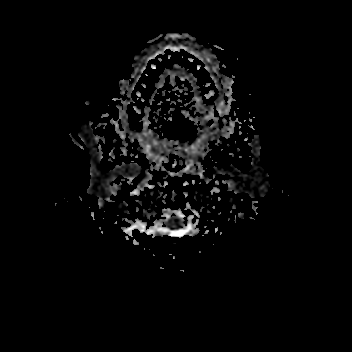
[im 24/47]
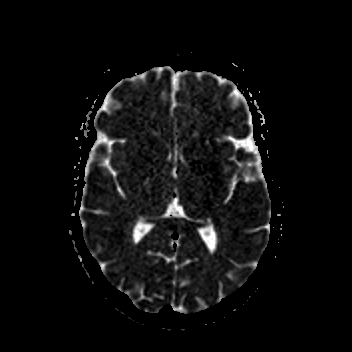
[im 47/47]
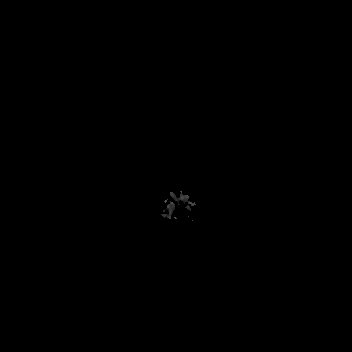

[Series 7: cor dwi_tracew · coronal · 5.0mm · 0.65mm/px · 2 of 38 slices shown]
[im 1/38]
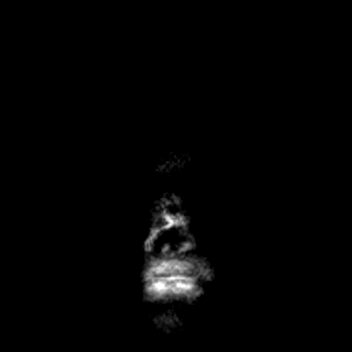
[im 38/38]
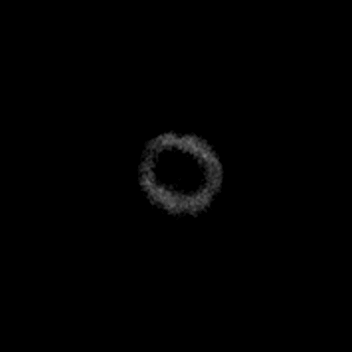

[Series 8: cor dwi_adc · coronal · 5.0mm · 0.65mm/px · 2 of 37 slices shown]
[im 1/37]
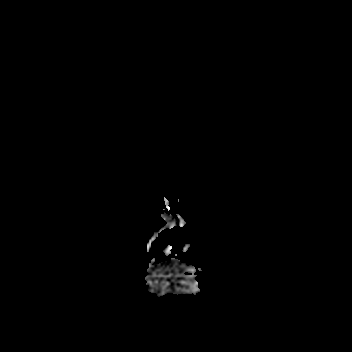
[im 37/37]
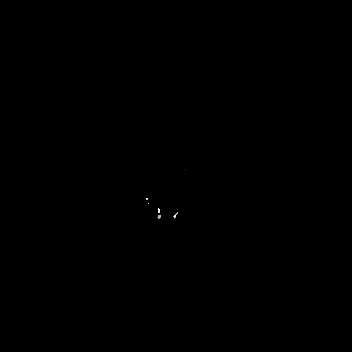

[Series 9: FLAIR · axial · 3.0mm · 0.53mm/px · z∈[-93,+68]mm · 3 of 55 slices shown]
[im 1/55]
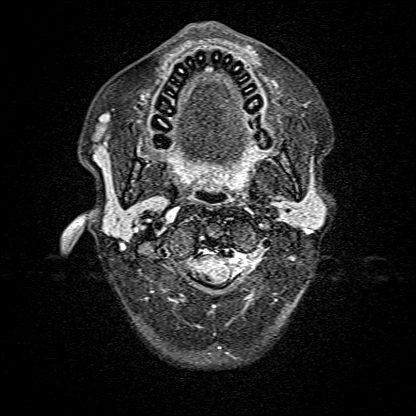
[im 28/55]
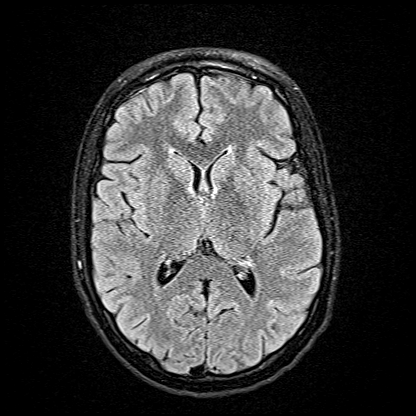
[im 55/55]
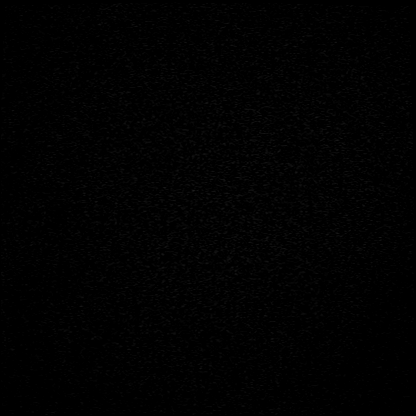

[Series 10: T1 · sagittal · 5.0mm · 0.62mm/px · 1 of 25 slices shown (1 of 2)]
[im 1/25]
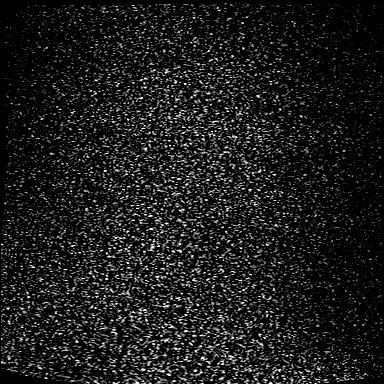

[Series 11: T2 · axial · 5.0mm · 0.53mm/px · z∈[-93,+63]mm · 2 of 27 slices shown (1 of 2)]
[im 1/27]
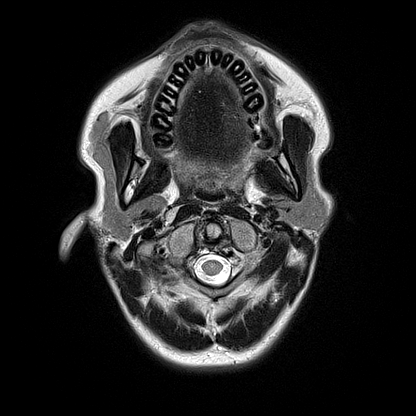
[im 27/27]
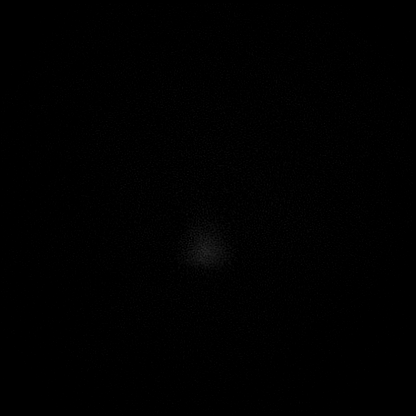

[Series 13: pha_images · axial · 3.0mm · 0.90mm/px · z∈[-104,+57]mm · 3 of 55 slices shown]
[im 1/55]
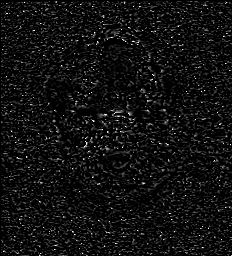
[im 28/55]
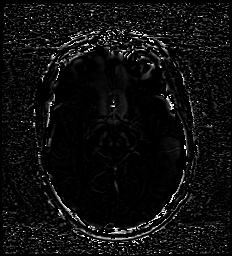
[im 55/55]
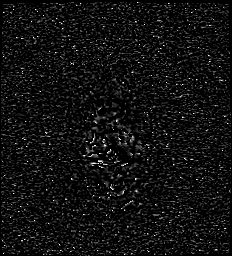

[Series 14: swi_images · axial · 3.0mm · 0.90mm/px · z∈[-104,+72]mm · 3 of 60 slices shown]
[im 1/60]
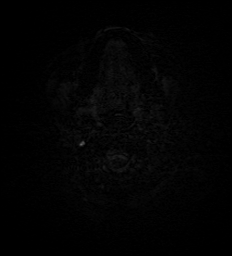
[im 30/60]
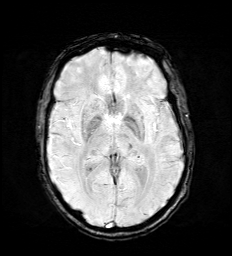
[im 60/60]
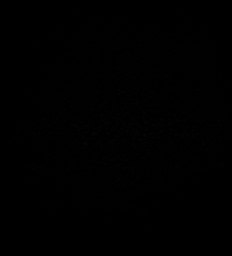

[Series 16: T1 · axial · 1.0mm · 0.98mm/px · z∈[-100,+74]mm · 9 of 175 slices shown (2 of 2)]
[im 1/175]
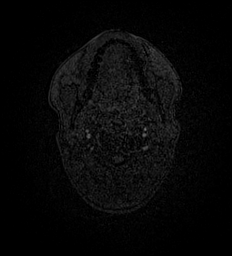
[im 20/175]
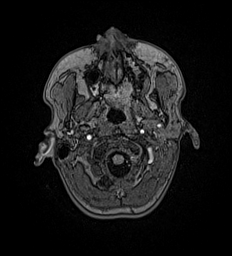
[im 39/175]
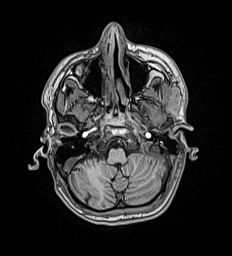
[im 59/175]
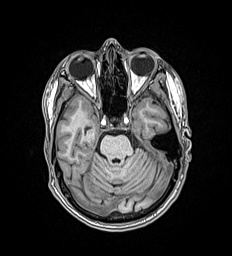
[im 78/175]
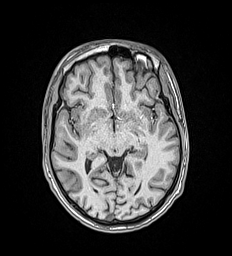
[im 97/175]
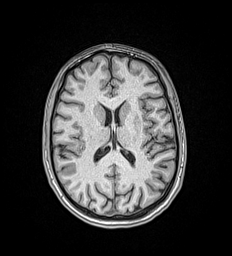
[im 117/175]
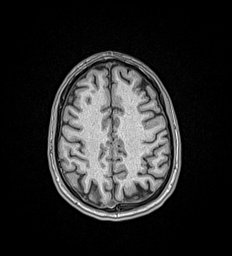
[im 155/175]
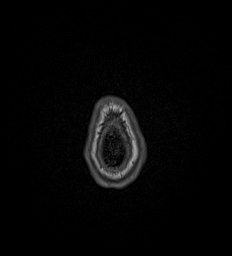
[im 175/175]
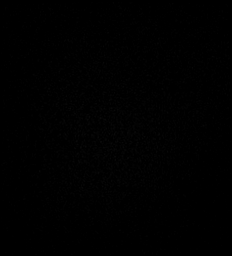

[Series 17: T2 · coronal · 5.0mm · 0.45mm/px · 2 of 32 slices shown (2 of 2)]
[im 1/32]
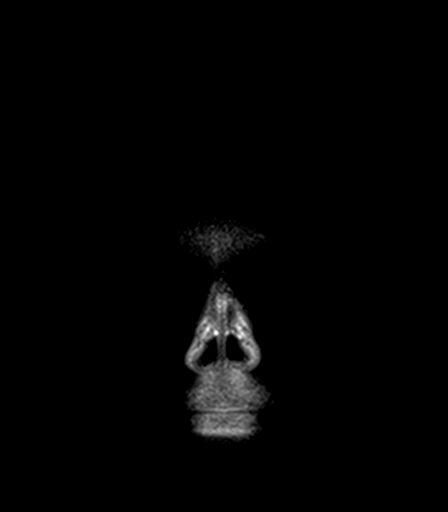
[im 32/32]
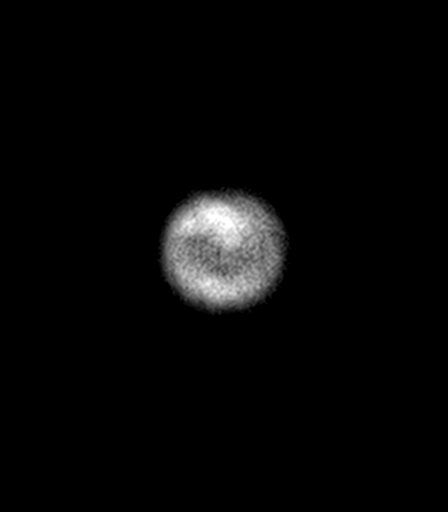

[Series 18: T1 post-contrast · axial · 1.0mm · 0.98mm/px · z∈[-100,+74]mm · 10 of 176 slices shown (1 of 3)]
[im 1/176]
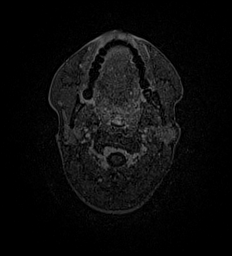
[im 20/176]
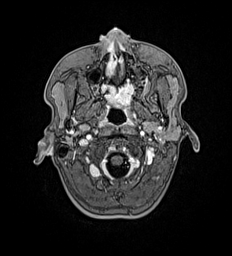
[im 39/176]
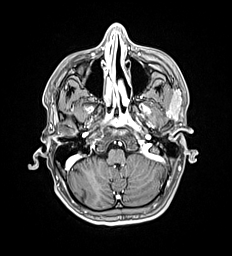
[im 59/176]
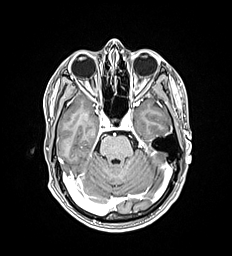
[im 78/176]
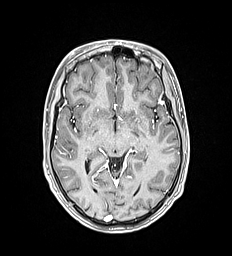
[im 98/176]
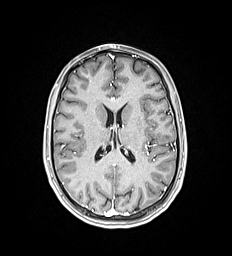
[im 117/176]
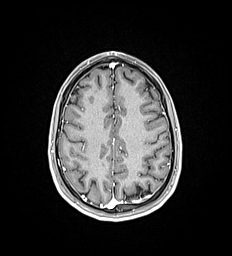
[im 137/176]
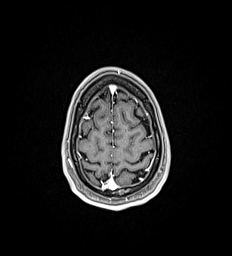
[im 156/176]
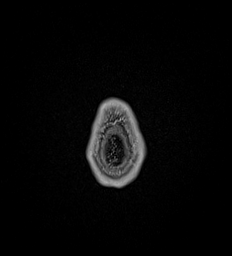
[im 176/176]
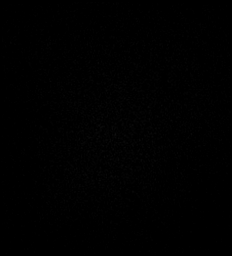

[Series 19: T1 post-contrast · coronal · 5.0mm · 0.57mm/px · 2 of 29 slices shown (2 of 3)]
[im 1/29]
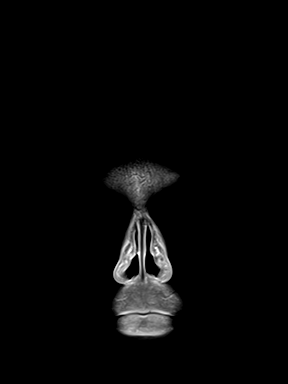
[im 29/29]
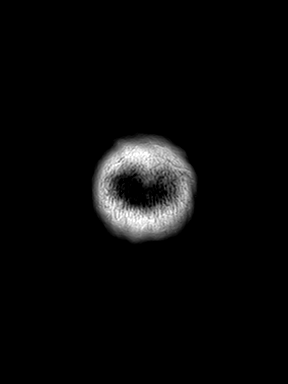

[Series 20: T1 post-contrast · sagittal · 5.0mm · 0.62mm/px · 1 of 25 slices shown (3 of 3)]
[im 1/25]
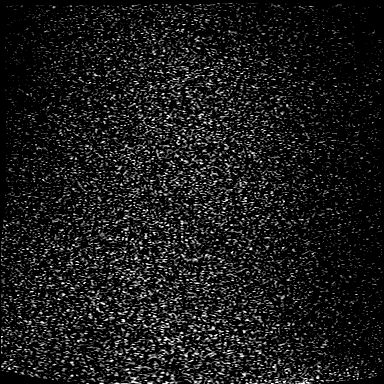

[47 of 48 positions shown; findings below may reference images not displayed]

FINDINGS: Brain: No acute infarct, mass effect or extra-axial collection.
There is a hyperintense T2-weighted signal lesion at the left
frontoparietal junction with associated magnetic susceptibility
effects, measuring 1.6 x 1.2 cm. There is no surrounding edema.
There is minimal associated enhancement. Normal white matter signal,
parenchymal volume and CSF spaces. The midline structures are
normal.

Vascular: Major flow voids are preserved.

Skull and upper cervical spine: Normal calvarium and skull base.
Visualized upper cervical spine and soft tissues are normal.

Sinuses/Orbits:No paranasal sinus fluid levels or advanced mucosal
thickening. No mastoid or middle ear effusion. Normal orbits.
IMPRESSION: Findings most consistent with a cavernous malformation at the left
frontoparietal junction. No surrounding edema.

## 2021-02-14 IMAGING — CT CT HEAD W/O CM
4 series · 15 of 47 positions shown, 17 images · non-contrast
Comparison: Cervical spine radiographs [DATE].

CLINICAL DATA: Seizure, abnormal neuro exam. Neck trauma, focal
neuro deficit or paresthesia (age 16-64y).

EXAM:
CT HEAD WITHOUT CONTRAST
CT CERVICAL SPINE WITHOUT CONTRAST
TECHNIQUE: Multidetector CT imaging of the head and cervical spine was
performed following the standard protocol without intravenous
contrast. Multiplanar CT image reconstructions of the cervical spine
were also generated.

[Series 2: head bone · axial · 0.41mm/px · z∈[+337,+351]mm · 2 of 73 slices shown]
[im 8/73  bone]
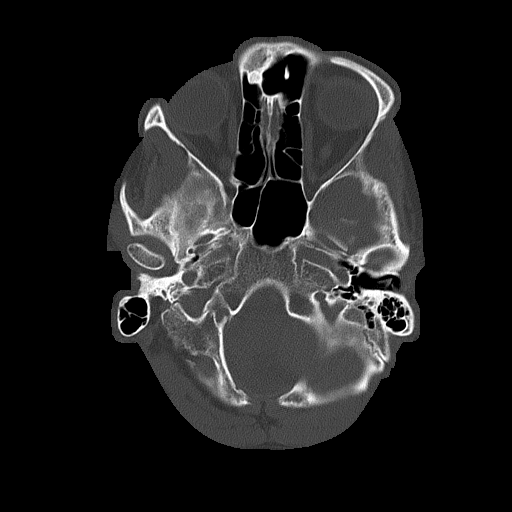
[im 15/73  bone]
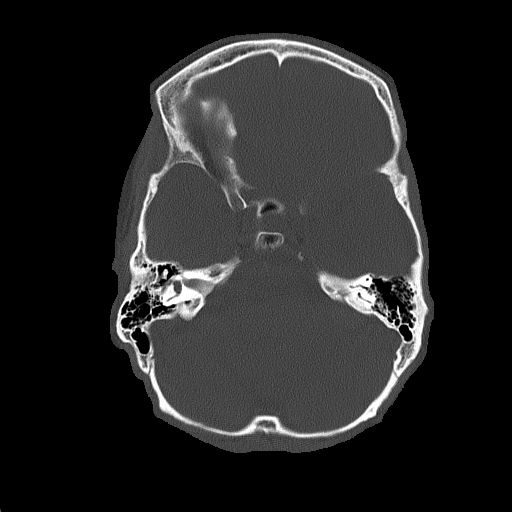

[Series 3: head wo · axial · 0.41mm/px · z∈[+338,+443]mm · 7 of 29 slices shown, 9 images]
[im 4/29  brain]
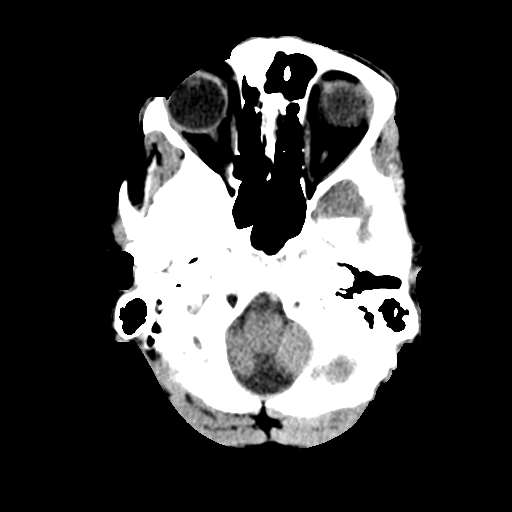
[im 4/29  bone]
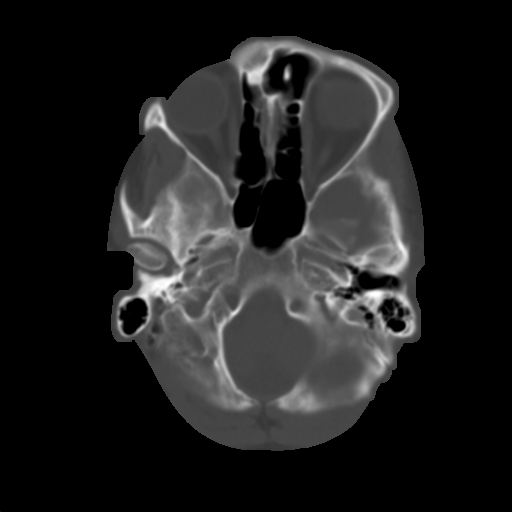
[im 8/29  brain]
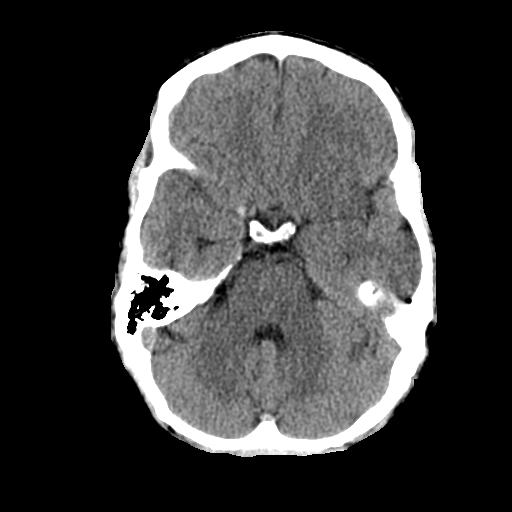
[im 11/29  brain]
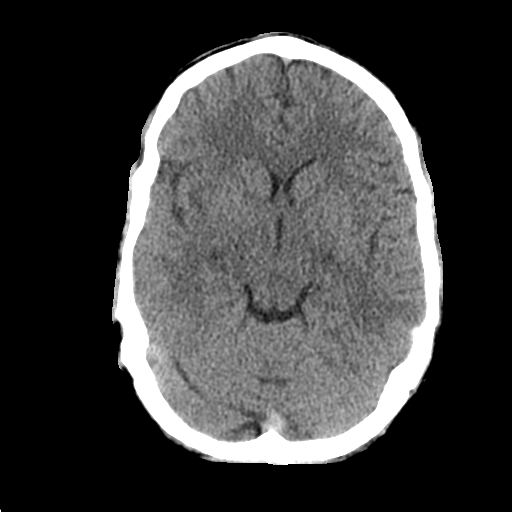
[im 15/29  brain]
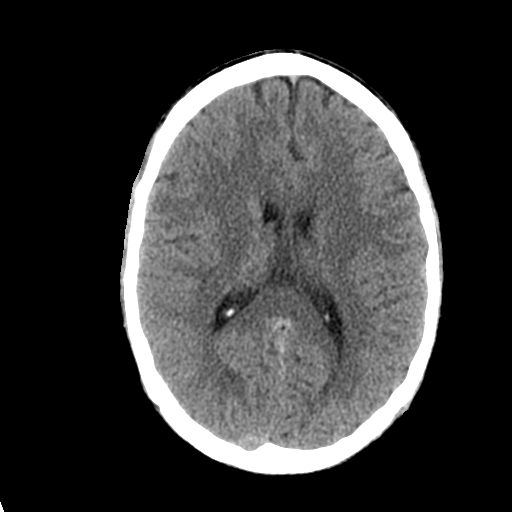
[im 18/29  brain]
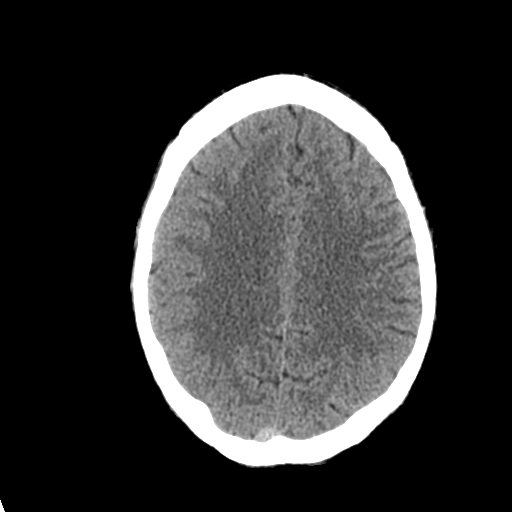
[im 18/29  bone]
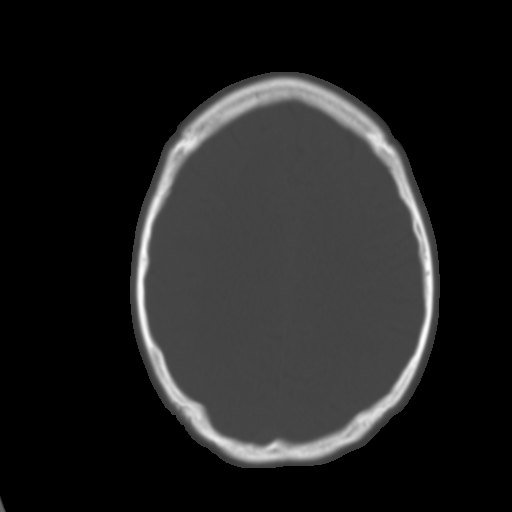
[im 22/29  brain]
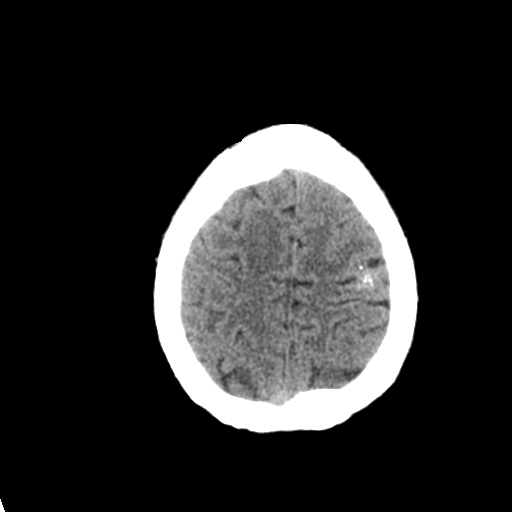
[im 25/29  brain]
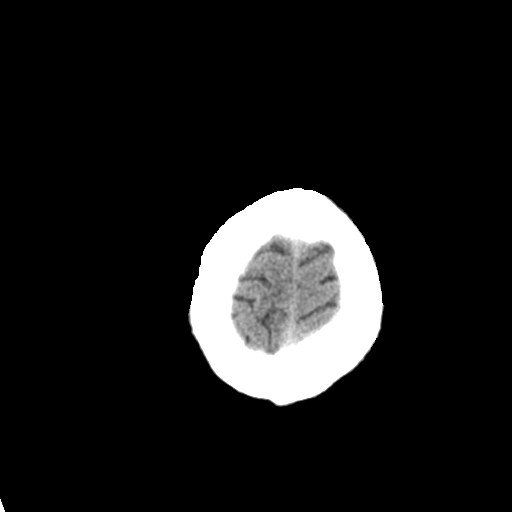

[Series 4: coronal soft tissue · coronal · 0.28mm/px · 3 of 75 slices shown]
[im 25/75  brain]
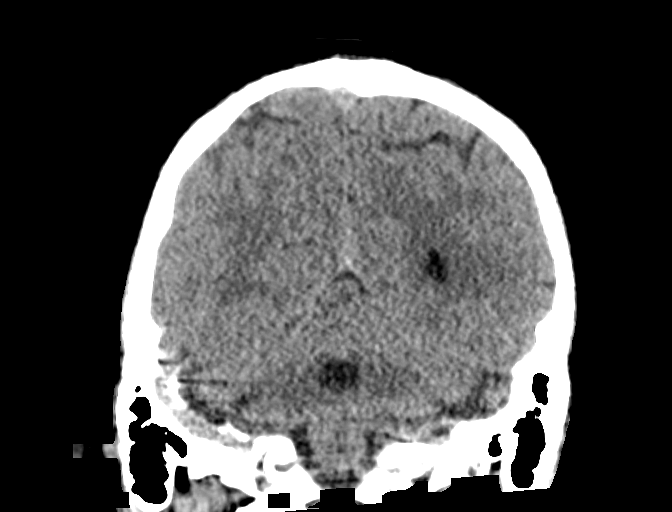
[im 33/75  brain]
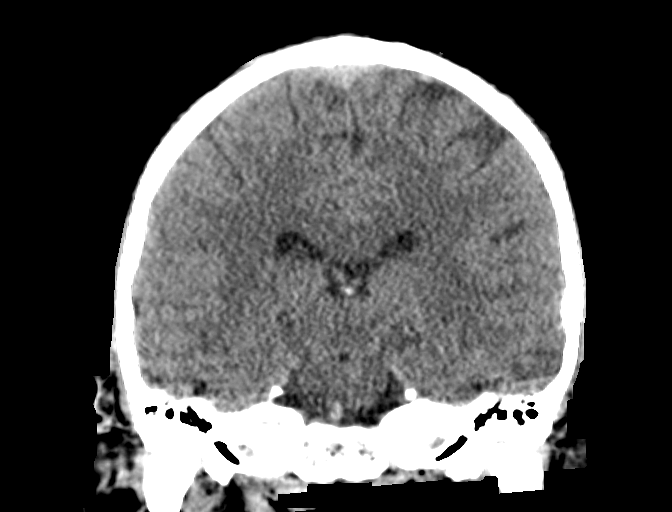
[im 42/75  brain]
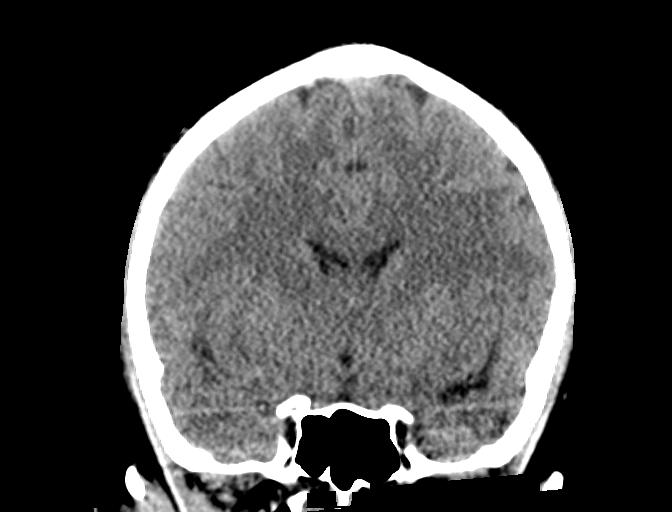

[Series 5: sagittal soft tissue · sagittal · 0.33mm/px · 3 of 56 slices shown]
[im 19/56  brain]
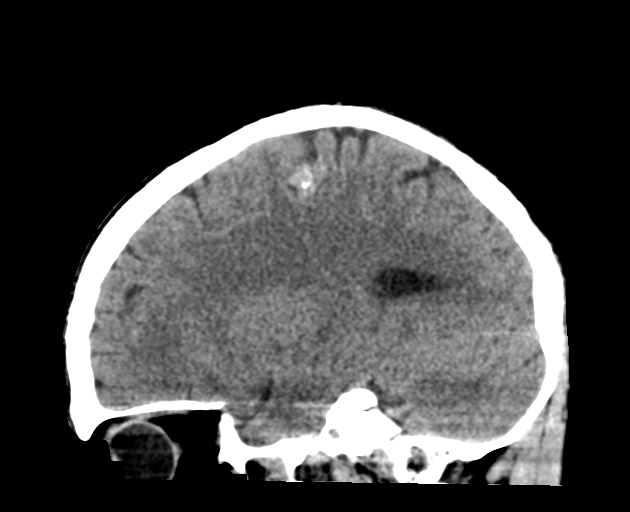
[im 28/56  brain]
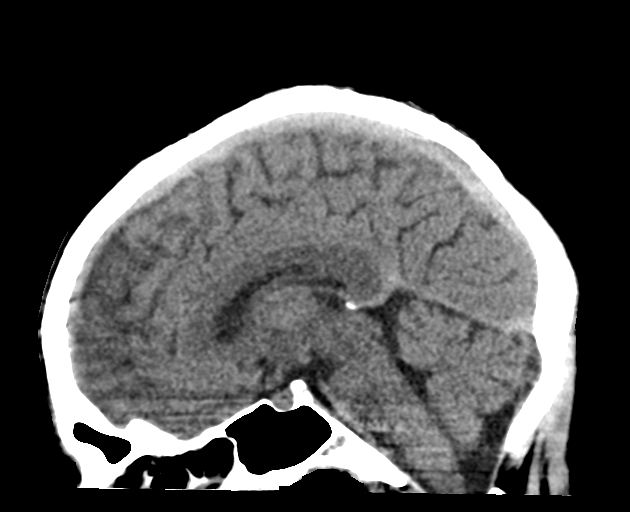
[im 36/56  brain]
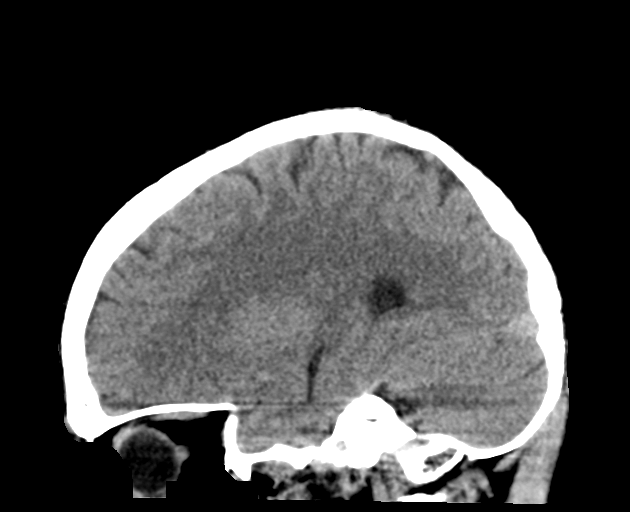

[15 of 47 positions shown; findings below may reference images not displayed]

FINDINGS: CT HEAD FINDINGS

Brain:

Cerebral volume is normal.

1.8 x 1.4 x 1.4 cm hyperdense and partially calcified cortically
based mass/lesion within the posterior left frontal lobe.

There is no acute intracranial hemorrhage.

No demarcated cortical infarct.

No extra-axial fluid collection.

No midline shift.

Vascular: No hyperdense vessel.

Skull: Normal. Negative for fracture or focal lesion.

Sinuses/Orbits: Visualized orbits show no acute finding. No
significant paranasal sinus disease at the imaged levels.

CT CERVICAL SPINE FINDINGS

Alignment: Cervical dextrocurvature. Straightening of the expected
cervical lordosis. No significant spondylolisthesis.

Skull base and vertebrae: The basion-dental and atlanto-dental
intervals are maintained.No evidence of acute fracture to the
cervical spine.

Soft tissues and spinal canal: No prevertebral fluid or swelling. No
visible canal hematoma.

Disc levels: No significant bony spinal canal or neural foraminal
narrowing.

Upper chest: No consolidation within the imaged lung apices. No
visible pneumothorax.

CT head results and recommendations called by telephone at the time
of interpretation on [DATE] at [DATE] to provider PELON
, who verbally acknowledged these results.
IMPRESSION: CT head:

1. 1.8 x 1.4 cm hyperdense and partially calcified cortically-based
mass/lesion within the left frontal lobe. A contrast-enhanced brain
MRI is recommended for further characterization.
2. Otherwise unremarkable non-contrast CT appearance of the brain.

CT cervical spine:

1. No evidence of acute fracture to the cervical spine.
2. Nonspecific straightening of the expected cervical lordosis.
3. Cervical dextrocurvature.

## 2021-02-14 MED ORDER — INSULIN REGULAR(HUMAN) IN NACL 100-0.9 UT/100ML-% IV SOLN: INTRAVENOUS | Status: DC

## 2021-02-14 MED ORDER — LACTATED RINGERS IV BOLUS
1000.0000 mL | Freq: Once | INTRAVENOUS | Status: AC
  Administered 2021-02-14: 1000 mL via INTRAVENOUS

## 2021-02-14 MED ORDER — LEVETIRACETAM IN NACL 500 MG/100ML IV SOLN
500.0000 mg | Freq: Two times a day (BID) | INTRAVENOUS | Status: DC
  Filled 2021-02-14 (×2): qty 100

## 2021-02-14 MED ORDER — INSULIN GLARGINE 100 UNIT/ML SOLOSTAR PEN: 35.0000 [IU] | PEN_INJECTOR | Freq: Every day | SUBCUTANEOUS | Status: DC

## 2021-02-14 MED ORDER — INSULIN ASPART 100 UNIT/ML IJ SOLN
0.0000 [IU] | Freq: Every day | INTRAMUSCULAR | Status: DC
  Administered 2021-02-14: 3 [IU] via SUBCUTANEOUS
  Filled 2021-02-14: qty 1

## 2021-02-14 MED ORDER — THIAMINE HCL 100 MG PO TABS
100.0000 mg | ORAL_TABLET | Freq: Every day | ORAL | Status: DC
  Administered 2021-02-14 – 2021-02-15 (×2): 100 mg via ORAL
  Filled 2021-02-14 (×2): qty 1

## 2021-02-14 MED ORDER — ADULT MULTIVITAMIN W/MINERALS CH
1.0000 | ORAL_TABLET | Freq: Every day | ORAL | Status: DC
  Administered 2021-02-15: 1 via ORAL
  Filled 2021-02-14: qty 1

## 2021-02-14 MED ORDER — LEVETIRACETAM 500 MG PO TABS
500.0000 mg | ORAL_TABLET | Freq: Two times a day (BID) | ORAL | Status: DC
  Administered 2021-02-14 – 2021-02-15 (×2): 500 mg via ORAL
  Filled 2021-02-14 (×3): qty 1

## 2021-02-14 MED ORDER — ACETAMINOPHEN 160 MG/5ML PO SOLN
650.0000 mg | Freq: Four times a day (QID) | ORAL | Status: DC | PRN
  Filled 2021-02-14: qty 20.3

## 2021-02-14 MED ORDER — DEXTROSE IN LACTATED RINGERS 5 % IV SOLN: INTRAVENOUS | Status: DC

## 2021-02-14 MED ORDER — DEXTROSE 50 % IV SOLN: 0.0000 mL | INTRAVENOUS | Status: DC | PRN

## 2021-02-14 MED ORDER — LORAZEPAM 2 MG/ML IJ SOLN: 1.0000 mg | INTRAMUSCULAR | Status: DC | PRN

## 2021-02-14 MED ORDER — INSULIN GLARGINE-YFGN 100 UNIT/ML ~~LOC~~ SOLN
35.0000 [IU] | Freq: Every day | SUBCUTANEOUS | Status: DC
  Filled 2021-02-14: qty 0.35

## 2021-02-14 MED ORDER — ENOXAPARIN SODIUM 40 MG/0.4ML IJ SOSY: 40.0000 mg | PREFILLED_SYRINGE | INTRAMUSCULAR | Status: DC

## 2021-02-14 MED ORDER — ONDANSETRON HCL 4 MG/2ML IJ SOLN: 4.0000 mg | Freq: Three times a day (TID) | INTRAMUSCULAR | Status: DC | PRN

## 2021-02-14 MED ORDER — GADOBUTROL 1 MMOL/ML IV SOLN
7.0000 mL | Freq: Once | INTRAVENOUS | Status: AC | PRN
  Administered 2021-02-14: 7 mL via INTRAVENOUS

## 2021-02-14 MED ORDER — HYDROCODONE-ACETAMINOPHEN 5-325 MG PO TABS: 1.0000 | ORAL_TABLET | ORAL | Status: DC | PRN

## 2021-02-14 MED ORDER — LACTATED RINGERS IV SOLN: INTRAVENOUS | Status: DC

## 2021-02-14 MED ORDER — LEVETIRACETAM IN NACL 1000 MG/100ML IV SOLN
1000.0000 mg | Freq: Once | INTRAVENOUS | Status: AC
  Administered 2021-02-14: 1000 mg via INTRAVENOUS
  Filled 2021-02-14: qty 100

## 2021-02-14 MED ORDER — POTASSIUM CHLORIDE 10 MEQ/100ML IV SOLN
10.0000 meq | INTRAVENOUS | Status: AC
Start: 2021-02-14 — End: 2021-02-14
  Administered 2021-02-14 (×2): 10 meq via INTRAVENOUS
  Filled 2021-02-14 (×2): qty 100

## 2021-02-14 MED ORDER — INSULIN GLARGINE-YFGN 100 UNIT/ML ~~LOC~~ SOLN
35.0000 [IU] | Freq: Every day | SUBCUTANEOUS | Status: DC
  Administered 2021-02-14: 35 [IU] via SUBCUTANEOUS
  Filled 2021-02-14 (×2): qty 0.35

## 2021-02-14 MED ORDER — CYCLOBENZAPRINE HCL 10 MG PO TABS: 5.0000 mg | ORAL_TABLET | Freq: Three times a day (TID) | ORAL | Status: DC | PRN

## 2021-02-14 MED ORDER — INSULIN ASPART 100 UNIT/ML IJ SOLN
0.0000 [IU] | Freq: Three times a day (TID) | INTRAMUSCULAR | Status: DC
  Administered 2021-02-14: 1 [IU] via SUBCUTANEOUS
  Administered 2021-02-15: 3 [IU] via SUBCUTANEOUS
  Filled 2021-02-14: qty 1

## 2021-02-14 MED ORDER — INSULIN REGULAR(HUMAN) IN NACL 100-0.9 UT/100ML-% IV SOLN
INTRAVENOUS | Status: DC
  Administered 2021-02-14: 2.4 [IU]/h via INTRAVENOUS
  Administered 2021-02-14: 10 [IU]/h via INTRAVENOUS
  Administered 2021-02-14: 1.5 [IU]/h via INTRAVENOUS
  Filled 2021-02-14: qty 100

## 2021-02-14 NOTE — ED Provider Notes (Addendum)
-----------------------------------------   8:34 AM on 02/14/2021 ----------------------------------------- Case discussed with Dr. Dimas Aguas at [facility] via the transfer center.  They do not currently have any beds available but will place patient on the waiting list.  Patient is currently agreeable to admission here at [facility] for further management of his DKA.   Chesley Noon, MD 02/14/21 [phone]  ----------------------------------------- 10:53 AM on 02/14/2021 ----------------------------------------- I was notified by radiology tech that CT scan is concerning for hemorrhage.  We will add on CT cervical spine given possible history of trauma.  CT head images reviewed with Dr. Elonda Husky of radiology, who states that findings consistent with a calcified mass, which could be a source of seizure.  He recommends follow-up MRI brain with contrast.  This information was passed along to hospitalist.  Patient remains awake and alert at this time with no focal neurologic deficits, he denies headache.   Chesley Noon, MD 02/14/21 1054

## 2021-02-14 NOTE — ED Notes (Signed)
Phlebotomy to obtain labs for patient. 

## 2021-02-14 NOTE — ED Provider Notes (Signed)
[facility] Emergency Department Provider Note  ____________________________________________  Time seen: Approximately 6:39 AM  I have reviewed the triage vital signs and the nursing notes.   HISTORY  Chief Complaint Hyperglycemia   HPI Jesus Ewing is a 20 y.o. male history of type 1 diabetes, cocaine and alcohol use who presents for evaluation of seizure-like activity.  According to patient has been partying and using cocaine and alcohol.  According to EMS, friends at the house described patient staring and having convulsions which prompted him to call 911.  His CBG was 372.  Patient seems postictal per EMS.  He denies any history of seizure disorder.  Patient does endorse taking 200 mg of trazodone an hour ago to try to help him sleep.  Patient is very vague when he comes to describing his insulin regimen and it is unclear if he is taking it as prescribed.  He denies any headache or fever, chest pain or shortness of breath.  Patient is requesting transfer to [facility].   Past Medical History:  Diagnosis Date   Celiac disease    Diabetes mellitus without complication [facility])     Patient Active Problem List   Diagnosis Date Noted   DKA (diabetic ketoacidoses) 05/26/2019   DKA, type 1 (HCC) 06/19/2016   Celiac disease 06/19/2016    Past Surgical History:  Procedure Laterality Date   NO PAST SURGERIES      Prior to Admission medications   Medication Sig Start Date End Date Taking? Authorizing Provider  HYDROcodone-acetaminophen (NORCO/VICODIN) 5-325 MG tablet Take 1 tablet by mouth every 4 (four) hours as needed. 03/09/20   Minna Antis, MD  insulin aspart (NOVOLOG) 100 UNIT/ML injection Inject 1-8 Units into the skin 3 (three) times daily before meals. Using a sliding scale. 1 unit per 25 over 100 blood glucose reading.    [provider]  insulin aspart protamine- aspart (NOVOLOG MIX 70/30) (70-30) 100 UNIT/ML injection Inject 44  Units into the skin 2 (two) times daily with a meal.     [provider]  insulin lispro (HUMALOG) 100 UNIT/ML KwikPen Inject 0-50 Units into the skin as directed. 07/24/18   [provider]  LANTUS SOLOSTAR 100 UNIT/ML Solostar Pen Inject 30 Units into the skin at bedtime. 05/04/19   [provider]    Allergies Patient has no known allergies.  Family History  Family history unknown: Yes    Social History Social History   Tobacco Use   Smoking status: Never   Smokeless tobacco: Never  Vaping Use   Vaping Use: Every day  Substance Use Topics   Alcohol use: No   Drug use: No    Review of Systems  Constitutional: Negative for fever. + seizure like activity Eyes: Negative for visual changes. ENT: Negative for sore throat. Neck: No neck pain  Cardiovascular: Negative for chest pain. Respiratory: Negative for shortness of breath. Gastrointestinal: Negative for abdominal pain, vomiting or diarrhea. Genitourinary: Negative for dysuria. Musculoskeletal: Negative for back pain. Skin: Negative for rash. Neurological: Negative for headaches, weakness or numbness. Psych: No SI or HI  ____________________________________________   PHYSICAL EXAM:  VITAL SIGNS: ED Triage Vitals  Enc Vitals Group     BP 02/14/21 0509 127/80     Pulse Rate 02/14/21 0509 (!) 113     Resp 02/14/21 0509 16     Temp 02/14/21 0509 97.8 F (36.6 C)     Temp Source 02/14/21 0509 Oral     SpO2  02/14/21 0509 97 %     Weight --      Height --      Head Circumference --      Peak Flow --      Pain Score 02/14/21 0511 0     Pain Loc --      Pain Edu? --      Excl. in GC? --     Constitutional: Alert and oriented. Well appearing and in no apparent distress. HEENT:      Head: Normocephalic and atraumatic.         Eyes: Conjunctivae are normal. Sclera is non-icteric.       Mouth/Throat: Mucous membranes are dry.       Neck: Supple with no signs of  meningismus. Cardiovascular: Tachycardic with regular rhythm Respiratory: Normal respiratory effort. Lungs are clear to auscultation bilaterally.  Gastrointestinal: Soft, non tender, and non distended with positive bowel sounds. No rebound or guarding. Genitourinary: No CVA tenderness. Musculoskeletal:  No edema, cyanosis, or erythema of extremities. Neurologic: Normal speech and language. Face is symmetric. Moving all extremities. No gross focal neurologic deficits are appreciated. Skin: Skin is warm, dry and intact. No rash noted. Psychiatric: Mood and affect are normal. Speech and behavior are normal.  ____________________________________________   LABS (all labs ordered are listed, but only abnormal results are displayed)  Labs Reviewed  BASIC METABOLIC PANEL - Abnormal; Notable for the following components:      Result Value   CO2 9 (*)    Glucose, Bld 433 (*)    BUN 22 (*)    Creatinine, Ser 1.26 (*)    Anion gap 27 (*)    All other components within normal limits  CBC - Abnormal; Notable for the following components:   RDW 11.3 (*)    All other components within normal limits  URINALYSIS, COMPLETE (UACMP) WITH MICROSCOPIC - Abnormal; Notable for the following components:   Color, Urine STRAW (*)    APPearance CLEAR (*)    Glucose, UA >=500 (*)    Ketones, ur 80 (*)    All other components within normal limits  CBG MONITORING, ED - Abnormal; Notable for the following components:   Glucose-Capillary 427 (*)    All other components within normal limits  RESP PANEL BY RT-PCR (FLU A&B, COVID) ARPGX2  BLOOD GAS, VENOUS  BETA-HYDROXYBUTYRIC ACID  URINALYSIS, ROUTINE W REFLEX MICROSCOPIC   ____________________________________________  EKG  none  ____________________________________________  RADIOLOGY  none  ____________________________________________   PROCEDURES  Procedure(s) performed:yes .1-3 Lead EKG Interpretation  Date/Time: 02/14/2021 6:47 AM Performed  by: Nita Sickle, MD Authorized by: Nita Sickle, MD     Interpretation: non-specific     ECG rate assessment: tachycardic     Rhythm: sinus tachycardia     Ectopy: none     Conduction: normal     Critical Care performed: yes  CRITICAL CARE Performed by: Nita Sickle  ?  Total critical care time: 30 min  Critical care time was exclusive of separately billable procedures and treating other patients.  Critical care was necessary to treat or prevent imminent or life-threatening deterioration.  Critical care was time spent personally by me on the following activities: development of treatment plan with patient and/or surrogate as well as nursing, discussions with consultants, evaluation of patient's response to treatment, examination of patient, obtaining history from patient or surrogate, ordering and performing treatments and interventions, ordering and review of laboratory studies, ordering and review of radiographic studies, pulse oximetry and  re-evaluation of patient's condition.  ____________________________________________   INITIAL IMPRESSION / ASSESSMENT AND PLAN / ED COURSE  20 y.o. male history of type 1 diabetes, cocaine and alcohol use who presents for evaluation of seizure-like activity.  Patient has been using cocaine and alcohol.  Unclear if he has been using his insulin as prescribed.  On arrival patient found to be in DKA. He is alert and oriented x3 with a GCS of 15, complete neurologically intact otherwise. Labs showing BG 433, anion gap of 27, bicarb of 9, creatinine of 1.26, K of 5, 80 ketones.  Beta hydroxybutyric acid is pending.  Patient given a liter bolus in triage.  We will give a second liter, initiate DKA protocol with insulin drip.  Patient is [facility] be [facility] to [facility].  We will contact [facility] for possible transfer.  Patient placed on telemetry for close monitoring.  Old medical records reviewed       _____________________________________________ Please note:  Patient was evaluated in Emergency Department today for the symptoms described in the history of present illness. Patient was evaluated in the context of the global COVID-19 pandemic, which necessitated consideration that the patient might be at risk for infection with the SARS-CoV-2 virus that causes COVID-19. Institutional protocols and algorithms that pertain to the evaluation of patients at risk for COVID-19 are in a state of rapid change based on information released by regulatory bodies including the CDC and federal and state organizations. These policies and algorithms were followed during the patient's care in the ED.  Some ED evaluations and interventions may be delayed as a result of limited staffing during the pandemic.   [place] Controlled Substance Database was reviewed by me. ____________________________________________   FINAL CLINICAL IMPRESSION(S) / ED DIAGNOSES   Final diagnoses:  Diabetic ketoacidosis without coma associated with type 1 diabetes mellitus (HCC)      NEW MEDICATIONS STARTED DURING THIS VISIT:  ED Discharge Orders     None        Note:  This document was prepared using Dragon voice recognition software and may include unintentional dictation errors.    Don Perking, [place], MD 02/14/21 [phone]

## 2021-02-14 NOTE — ED Notes (Signed)
Pt provided 3 sugar free apple sauces and ice chips - Ok'd by MD.

## 2021-02-14 NOTE — ED Notes (Signed)
Pt offered a meal tray - pt declined at this time. Pts GF to inform RN when the patient is ready to eat - meal tray reserved from him in the fridge.

## 2021-02-14 NOTE — Progress Notes (Signed)
[facility] night shift stepdown unit coverage note.  The nursing staff reported that the patient is no longer on an insulin infusion per Dr. Clyde Lundborg verbal orders.  Diet ordered.  Duplicate and no longer needed orders discontinued.  The nursing staff also told us that the patient will no longer require to be in the STU unit so a MedSurg bed with telemetry was requested.  Sanda Klein, MD.

## 2021-02-14 NOTE — ED Notes (Signed)
Per Dr. Clyde Lundborg D/C Insulin gtt & D5LR due to pts BS level.

## 2021-02-14 NOTE — ED Notes (Signed)
Lab bedside.

## 2021-02-14 NOTE — Procedures (Signed)
Pt did not want eeg at this time.  Has headache and wants to eat.  Notified Dr. Amada Jupiter and nurse.

## 2021-02-14 NOTE — ED Notes (Signed)
MD at the bedside  

## 2021-02-14 NOTE — ED Triage Notes (Signed)
Pt arrives via [facility], called out for Possible seizure. Per report, Friends described he was staring and having convulsions, on arrival by ems, appeared post ictal , arrival cbg 372, no insulin in the past 13 hours, has taken 4- 50 mg trazadone taken about 1 hour ago 140/80's, 135st, 99% ra, IV established in the left forearm, 500ns given.

## 2021-02-14 NOTE — Consult Note (Signed)
Neurology Consultation Reason for Consult: Seizure Referring Physician: Jarvis Morgan  CC: Seizure  History is obtained from: Patient, girlfriend  HPI: Jesus Ewing is a 20 y.o. male with a history of cocaine abuse who presents with new onset seizure.  He has not used in a couple of days, and states that he has cut down on his cocaine use considerably over the past year.  His girlfriend states that he was just sitting down, not doing anything special when he suddenly tensed up with bilateral arm flexion, leg extension eyes were open and staring ahead.  He then had generalized convulsion for multiple minutes.  Following this he was confused.  The patient does not remember any prodrome.  He had no antecedent symptoms including headache, fevers, chills, chest pain, belly pain, or other symptoms.  Currently, he complains of being hungry and tired but otherwise does not have any other complaints.  He denies any previous seizures, previous episodes of staring, previous episodes of loss time.  He does not drink very often at all, but "does like his cocaine."     ROS: A 14 point ROS was performed and is negative except as noted in the HPI.   Past Medical History:  Diagnosis Date   Celiac disease    Diabetes mellitus without complication (HCC)      Family History  Family history unknown: Yes     Social History:  reports that he has never smoked. He has never used smokeless tobacco. He reports current alcohol use. He reports current drug use. Drug: "Crack" cocaine.   Exam: Current vital signs: BP 101/86   Pulse 85   Temp 98.2 F (36.8 C) (Oral)   Resp 19   SpO2 100%  Vital signs in last 24 hours: Temp:  [97.8 F (36.6 C)-98.2 F (36.8 C)] 98.2 F (36.8 C) (08/01 1100) Pulse Rate:  [73-113] 85 (08/01 1500) Resp:  [14-19] 19 (08/01 1500) BP: (101-127)/(56-86) 101/86 (08/01 1500) SpO2:  [97 %-100 %] 100 % (08/01 1500)   Physical Exam  Constitutional: Appears  well-developed and well-nourished.  Psych: Patient is somewhat irritable. Eyes: No scleral injection HENT: No OP obstruction MSK: no joint deformities.  Cardiovascular: Normal rate and regular rhythm.  Respiratory: Effort normal, non-labored breathing GI: Soft.  No distension. There is no tenderness.  Skin: WDI  Neuro: Mental Status: Patient is somnolent but easily arousable.  Oriented to person, place, month, year, and situation. Patient is able to give a clear and coherent history. No signs of aphasia or neglect Cranial Nerves: II: Visual Fields are full. Pupils are equal, round, and reactive to light.   III,IV, VI: EOMI without ptosis or diploplia.  V: Facial sensation is symmetric to temperature VII: Facial movement is symmetric.  VIII: hearing is intact to voice X: Uvula elevates symmetrically XI: Shoulder shrug is symmetric. XII: tongue is midline without atrophy or fasciculations.  Motor: Tone is normal. Bulk is normal. 5/5 strength was present in bilateral lower extremities and right upper extremity.  His left upper extremity is impaired by multiple wires and blood pressure cuff, and is resistant to changing things around saying "it is just fine, just all the things on it" and therefore my exam of that upper extremity is limited. Sensory: Sensation is symmetric to light touch and temperature in the arms and legs. Cerebellar: FNF intact on the right,?  Mildly impaired on the left versus impairment due to apparatus.   I have reviewed labs in epic and the results  pertinent to this consultation are: Sodium 135 Initial CBG of 433 Creatinine of 1.26 WBC 9.4 UDS positive for amphetamines and cocaine  I have reviewed the images obtained: CT head-calcified lesion on the left  Impression: 20 year old male with a history of diabetes with seizure in the setting of recent cocaine use.  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  I would favor the latter given that he had not used in a couple of days.  I doubt his blood glucose of 433 played much of a role in his presentation.  Recommendations: 1) Keppra 500 mg twice daily 2) patient is not allowed to drive by [employer] for a period of 6 months, I discussed this with the patient who expressed understanding. 3) MRI brain with and without contrast 4) EEG 5) neurology will continue to follow   Ritta Slot, MD [facility] [phone]  If 7pm- 7am, please page neurology on call as listed in AMION.

## 2021-02-14 NOTE — ED Triage Notes (Addendum)
Pt says he took 4 tab of 50mg  trazadone about 1 hour ago to help him sleep. Last gave himself 45 units Lantus at 2am. Denies ETOH tonight. Reports right before he had "seizure activity" he had high heart rate and cotton mouth. He denies any symptoms now. No hx of seizures. Pt mother tested positive for COVID 1 week ago.

## 2021-02-14 NOTE — Progress Notes (Addendum)
Inpatient Diabetes Program Recommendations  AACE/ADA: New Consensus Statement on Inpatient Glycemic Control (2015)  Target Ranges:  Prepandial:   less than 140 mg/dL      Peak postprandial:   less than 180 mg/dL (1-2 hours)      Critically ill patients:  140 - 180 mg/dL   Results for AMDREW, OBOYLE (MRN [id number]) as of 02/14/2021 06:39  Ref. Range 02/14/2021 05:18  Sodium Latest Ref Range: 135 - 145 mmol/L 135  Potassium Latest Ref Range: 3.5 - 5.1 mmol/L 5.0  Chloride Latest Ref Range: 98 - 111 mmol/L 99  CO2 Latest Ref Range: 22 - 32 mmol/L 9 (L)  Glucose Latest Ref Range: 70 - 99 mg/dL 335 (H)  BUN Latest Ref Range: 6 - 20 mg/dL 22 (H)  Creatinine Latest Ref Range: 0.61 - 1.24 mg/dL 4.56 (H)  Calcium Latest Ref Range: 8.9 - 10.3 mg/dL 8.9  Anion gap Latest Ref Range: 5 - 15  27 (H)   Results for ERNESTINE, LANGWORTHY (MRN [id number]) as of 02/14/2021 06:39  Ref. Range 02/14/2021 05:12  Glucose-Capillary Latest Ref Range: 70 - 99 mg/dL 428 (H)   Admit with: Seizure-like activity after using cocaine and alcohol         DKA  History: Type 1 Diabetes, ETOH Abuse, Cocaine Abuse  Home DM Meds: Novolog 1-8 units TID with meals (1 unit for every 25 mg/dl >768 mg/dl + 1 unit for every 8 grams Carbohydrates)       Lantus 45 units QHS       Dexcom CGM  Current Orders: IV Insulin Drip     Per notes, "Pt says he took 4 tab of 50mg  trazadone about 1 hour ago to help him sleep. Last gave himself 45 units Lantus at 2am"  Endocrinologist: Dr. with [facility] Last Seen 09/29/2020  Agree with orders for IV fluids and IV Insulin Drip  Will follow if remains at [facility] and not transferred to [facility] per pt request    Addendum 1:30pm--Attempted to speak w/ pt down in the ED.  Pt difficult to rouse from sleep and when I did wake him he mumbled something incoherently and fell right back asleep.  Attempted a 2nd time to wake pt to speak with him  and pt again looked at me, mumbled, and fell right back asleep.  Will attempt to speak with pt when he is more awake and appropriate.       --Will follow patient during hospitalization--  [facility] RN, MSN, CDE Diabetes Coordinator Inpatient Glycemic Control Team Team Pager: [phone] (8a-5p)

## 2021-02-14 NOTE — ED Notes (Signed)
Phlebotomy to obtain labs. 

## 2021-02-14 NOTE — ED Notes (Signed)
Phlebotomy @ the bedside. 

## 2021-02-14 NOTE — H&P (Addendum)
History and Physical    Jesus Ewing QMV:[id number] DOB: 28-Oct-2000 DOA: 02/14/2021  Referring MD/NP/PA:   PCP: [facility]   Patient coming from:  The patient is coming from home.  At baseline, pt is independent for most of ADL.        Chief Complaint: Seizure-like activity  HPI: Jesus Ewing is a 20 y.o. male with medical history significant of type 1 diabetes, cocaine abuse, celiac disease, depression, who presents with seizure-like activity.  Per his girlfriend at bedside, patient was found to be "lock up" at about 3:00 AM, with whole body shaking, not talking, eyes staring backward, which lasted for about 15 minutes.  After that, patient was confused.  Patient does not have unilateral numbness or tingling symptoms in extremities, no facial droop or slurred speech.  Patient does not have history of seizure in past. Patient took 4 piils of 50 mg of trazodone to try to help him sleep.  Girlfriend states that patient currently is not drinking alcohol, but per nurse report, patient told nurse that he drank alcohol in weekend party.  He also told the nurse that he used cocaine.  When I saw pt in ED, he is sleeping, but arousable.  When aroused, patient is oriented x3.  Patient moves all extremities normally.  Patient does not have chest pain or abdominal pain.  No active nausea, vomiting, diarrhea noted.   Of note, patient requested to be transferred to [facility].  ED physician "discussed with Dr. Dimas Aguas at [facility] via the transfer center.  They do not currently have any beds available but will place patient on the waiting list.  Patient is currently agreeable to admission here at [facility] for further management of his DKA".  ED Course: pt was found to have DKA (blood sugar 433, anion gap 27, bicarbonate 9, pH 7.11 on VBG, beta hydroxybutyric acid > 8.0, positive ketones 80 urinalysis), AKI with creatinine 1.26 and BUN 22 (creatinine 0.76 on 03/09/2020), WBC 9.4,  temperature 97.8, blood pressure 123/77, heart rate 113, RR 19, oxygen saturation 99% on room air.  Pending CT of head.  Patient is admitted to stepdown as inpatient.  Review of Systems:   General: no fevers, chills, no body weight gain, has fatigue HEENT: no blurry vision, hearing changes or sore throat Respiratory: no dyspnea, coughing, wheezing CV: no chest pain, no palpitations GI: no nausea, vomiting, abdominal pain, diarrhea, constipation GU: no dysuria, burning on urination, increased urinary frequency, hematuria  Ext: no leg edema Neuro: no unilateral weakness, numbness, or tingling, no vision change or hearing loss. Has seizure-like activity Skin: no rash, no skin tear. MSK: No muscle spasm, no deformity, no limitation of range of movement in spin Heme: No easy bruising.  Travel history: No recent long distant travel.  Allergy: No Known Allergies  Past Medical History:  Diagnosis Date   Celiac disease    Diabetes mellitus without complication (HCC)     Past Surgical History:  Procedure Laterality Date   NO PAST SURGERIES      Social History:  reports that he has never smoked. He has never used smokeless tobacco. He reports current alcohol use. He reports current drug use. Drug: "Crack" cocaine.  Family History: I have reviewed with patient and his girlfriend about family medical history, they states that all family members do not have significant medical history. Family History  Family history unknown: Yes     Prior to Admission medications   Medication Sig Start  Date End Date Taking? Authorizing Provider  cyclobenzaprine (FLEXERIL) 5 MG tablet Take by mouth. 02/15/19  Yes [provider]  HYDROcodone-acetaminophen (NORCO/VICODIN) 5-325 MG tablet Take 1 tablet by mouth every 4 (four) hours as needed. 03/09/20  Yes Paduchowski, Caryn BeeKevin, MD  insulin lispro (HUMALOG) 100 UNIT/ML KwikPen Inject 0-50 Units into the skin as directed. 07/24/18  Yes [provider]  LANTUS SOLOSTAR 100 UNIT/ML Solostar Pen Inject 50 Units into the skin at bedtime. 05/04/19  Yes [provider]  Multiple Vitamin (QUINTABS) TABS Take 1 tablet by mouth daily. 09/21/20 09/21/21 Yes [provider]  sertraline (ZOLOFT) 25 MG tablet Take 25 mg by mouth daily. 09/20/20  Yes [provider]  insulin aspart (NOVOLOG) 100 UNIT/ML injection Inject 1-8 Units into the skin 3 (three) times daily before meals. Using a sliding scale. 1 unit per 25 over 100 blood glucose reading. Patient not taking: No sig reported    [provider]  insulin aspart protamine- aspart (NOVOLOG MIX 70/30) (70-30) 100 UNIT/ML injection Inject 44 Units into the skin 2 (two) times daily with a meal.  Patient not taking: No sig reported    [provider]    Physical Exam: Vitals:   02/14/21 2000 02/14/21 2205 02/15/21 0023 02/15/21 0423  BP: 111/62 (!) 110/57 (!) 102/55 (!) 99/55  Pulse: 78 88 75 64  Resp: 18 16 15 15   Temp: 98.1 F (36.7 C) 97.8 F (36.6 C) 97.6 F (36.4 C) 97.7 F (36.5 C)  TempSrc: Oral     SpO2: 100% 99% 98% 100%  Weight:  67.2 kg    Height:  5\' 10"  (1.778 m)     General: Not in acute distress.  Dry mucous membrane. HEENT:       Eyes: PERRL, EOMI, no scleral icterus.       ENT: No discharge from the ears and nose, no pharynx injection, no tonsillar enlargement.        Neck: No JVD, no bruit, no mass felt. Heme: No neck lymph node enlargement. Cardiac: S1/S2, RRR, No murmurs, No gallops or rubs. Respiratory: No rales, wheezing, rhonchi or rubs. GI: Soft, nondistended, nontender, no rebound pain, no organomegaly, BS present. GU: No hematuria Ext: No pitting leg edema bilaterally. 1+DP/PT pulse bilaterally. Musculoskeletal: No joint deformities, No joint redness or warmth, no limitation of ROM in spin. Skin: No rashes.  Neuro: sleepy, but arousable, when aroused, patient is oriented X3, cranial nerves II-XII grossly intact, moves all  extremities normally.  Psych: Patient is not psychotic, no suicidal or hemocidal ideation.  Labs on Admission: I have personally reviewed following labs and imaging studies  CBC: Recent Labs  Lab 02/14/21 0518  WBC 9.4  HGB 16.9  HCT 49.1  MCV 98.6  PLT 307   Basic Metabolic Panel: Recent Labs  Lab 02/14/21 0938 02/14/21 1126 02/14/21 1334 02/14/21 1813 02/15/21 0405  NA 134* 134* 134* 137 137  K 4.1 3.9 3.7 3.7 3.4*  CL 105 106 106 108 108  CO2 16* 15* 18* 21* 24  GLUCOSE 166* 160* 140* 137* 168*  BUN 16 15 15 13 14   CREATININE 0.92 0.79 0.84 0.60* 0.56*  CALCIUM 8.0* 8.0* 8.0* 8.6* 8.4*   GFR: Estimated Creatinine Clearance: 137.7 mL/min (A) (by C-G formula based on SCr of 0.56 mg/dL (L)). Liver Function Tests: No results for input(s): AST, ALT, ALKPHOS, BILITOT, PROT, ALBUMIN in the last 168 hours. No results for input(s): LIPASE, AMYLASE in the last 168 hours.  No results for input(s): AMMONIA in the last 168 hours. Coagulation Profile: No results for input(s): INR, PROTIME in the last 168 hours. Cardiac Enzymes: No results for input(s): CKTOTAL, CKMB, CKMBINDEX, TROPONINI in the last 168 hours. BNP (last 3 results) No results for input(s): PROBNP in the last 8760 hours. HbA1C: No results for input(s): HGBA1C in the last 72 hours. CBG: Recent Labs  Lab 02/14/21 1422 02/14/21 1643 02/14/21 1742 02/14/21 1908 02/14/21 2209  GLUCAP 136* 164* 142* 119* 279*   Lipid Profile: No results for input(s): CHOL, HDL, LDLCALC, TRIG, CHOLHDL, LDLDIRECT in the last 72 hours. Thyroid Function Tests: No results for input(s): TSH, T4TOTAL, FREET4, T3FREE, THYROIDAB in the last 72 hours. Anemia Panel: No results for input(s): VITAMINB12, FOLATE, FERRITIN, TIBC, IRON, RETICCTPCT in the last 72 hours. Urine analysis:    Component Value Date/Time   COLORURINE STRAW (A) 02/14/2021 0518   APPEARANCEUR CLEAR (A) 02/14/2021 0518   LABSPEC 1.024 02/14/2021 0518   PHURINE  5.0 02/14/2021 0518   GLUCOSEU >=500 (A) 02/14/2021 0518   HGBUR NEGATIVE 02/14/2021 0518   BILIRUBINUR NEGATIVE 02/14/2021 0518   KETONESUR 80 (A) 02/14/2021 0518   PROTEINUR NEGATIVE 02/14/2021 0518   NITRITE NEGATIVE 02/14/2021 0518   LEUKOCYTESUR NEGATIVE 02/14/2021 0518   Sepsis Labs: (procalcitonin:4,lacticidven:4) ) Recent Results (from the past 240 hour(s))  Resp Panel by RT-PCR (Flu A&B, Covid) Nasopharyngeal Swab     Status: None   Collection Time: 02/14/21  6:48 AM   Specimen: Nasopharyngeal Swab; Nasopharyngeal(NP) swabs in vial transport medium  Result Value Ref Range Status   SARS Coronavirus 2 by RT PCR NEGATIVE NEGATIVE Final    Comment: (NOTE) SARS-CoV-2 target nucleic acids are NOT DETECTED.  The SARS-CoV-2 RNA is generally detectable in upper respiratory specimens during the acute phase of infection. The lowest concentration of SARS-CoV-2 viral copies this assay can detect is 138 copies/mL. A negative result does not preclude SARS-Cov-2 infection and should not be used as the sole basis for treatment or other patient management decisions. A negative result may occur with  improper specimen collection/handling, submission of specimen other than nasopharyngeal swab, presence of viral mutation(s) within the areas targeted by this assay, and inadequate number of viral copies(<138 copies/mL). A negative result must be combined with clinical observations, patient history, and epidemiological information. The expected result is Negative.  Fact Sheet for Patients:  BloggerCourse.com  Fact Sheet for Healthcare Providers:  SeriousBroker.it  This test is no t yet approved or cleared by the [place] FDA and  has been authorized for detection and/or diagnosis of SARS-CoV-2 by FDA under an Emergency Use Authorization (EUA). This EUA will remain  in effect (meaning this test can be used) for the duration of  the COVID-19 declaration under Section 564(b)(1) of the Act, 21 U.S.C.section 360bbb-3(b)(1), unless the authorization is terminated  or revoked sooner.       Influenza A by PCR NEGATIVE NEGATIVE Final   Influenza B by PCR NEGATIVE NEGATIVE Final    Comment: (NOTE) The Xpert Xpress SARS-CoV-2/FLU/RSV plus assay is intended as an aid in the diagnosis of influenza from Nasopharyngeal swab specimens and should not be used as a sole basis for treatment. Nasal washings and aspirates are unacceptable for Xpert Xpress SARS-CoV-2/FLU/RSV testing.  Fact Sheet for Patients: BloggerCourse.com  Fact Sheet for Healthcare Providers: SeriousBroker.it  This test is not yet approved or cleared by the [place] FDA and has been authorized for detection and/or diagnosis of SARS-CoV-2 by FDA under  an Emergency Use Authorization (EUA). This EUA will remain in effect (meaning this test can be used) for the duration of the COVID-19 declaration under Section 564(b)(1) of the Act, 21 U.S.C. section 360bbb-3(b)(1), unless the authorization is terminated or revoked.  Performed at [facility], [address] [place], [place] [address]      Radiological Exams on Admission: CT HEAD WO CONTRAST  Result Date: 02/14/2021 CLINICAL DATA:  Seizure, abnormal neuro exam. Neck trauma, focal neuro deficit or paresthesia (age 36-64y). EXAM: CT HEAD WITHOUT CONTRAST CT CERVICAL SPINE WITHOUT CONTRAST TECHNIQUE: Multidetector CT imaging of the head and cervical spine was performed following the standard protocol without intravenous contrast. Multiplanar CT image reconstructions of the cervical spine were also generated. COMPARISON:  Cervical spine radiographs 02/14/2019. FINDINGS: CT HEAD FINDINGS Brain: Cerebral volume is normal. 1.8 x 1.4 x 1.4 cm hyperdense and partially calcified cortically based mass/lesion within the posterior left frontal lobe. There  is no acute intracranial hemorrhage. No demarcated cortical infarct. No extra-axial fluid collection. No midline shift. Vascular: No hyperdense vessel. Skull: Normal. Negative for fracture or focal lesion. Sinuses/Orbits: Visualized orbits show no acute finding. No significant paranasal sinus disease at the imaged levels. CT CERVICAL SPINE FINDINGS Alignment: Cervical dextrocurvature. Straightening of the expected cervical lordosis. No significant spondylolisthesis. Skull base and vertebrae: The basion-dental and atlanto-dental intervals are maintained.No evidence of acute fracture to the cervical spine. Soft tissues and spinal canal: No prevertebral fluid or swelling. No visible canal hematoma. Disc levels: No significant bony spinal canal or neural foraminal narrowing. Upper chest: No consolidation within the imaged lung apices. No visible pneumothorax. CT head results and recommendations called by telephone at the time of interpretation on 02/14/2021 at 10:52 am to provider [facility] , who verbally acknowledged these results. IMPRESSION: CT head: 1. 1.8 x 1.4 cm hyperdense and partially calcified cortically-based mass/lesion within the left frontal lobe. A contrast-enhanced brain MRI is recommended for further characterization. 2. Otherwise unremarkable non-contrast CT appearance of the brain. CT cervical spine: 1. No evidence of acute fracture to the cervical spine. 2. Nonspecific straightening of the expected cervical lordosis. 3. Cervical dextrocurvature. Electronically Signed   By: Jackey Loge DO   On: 02/14/2021 11:05   CT Cervical Spine Wo Contrast  Result Date: 02/14/2021 CLINICAL DATA:  Seizure, abnormal neuro exam. Neck trauma, focal neuro deficit or paresthesia (age 45-64y). EXAM: CT HEAD WITHOUT CONTRAST CT CERVICAL SPINE WITHOUT CONTRAST TECHNIQUE: Multidetector CT imaging of the head and cervical spine was performed following the standard protocol without intravenous contrast. Multiplanar CT  image reconstructions of the cervical spine were also generated. COMPARISON:  Cervical spine radiographs 02/14/2019. FINDINGS: CT HEAD FINDINGS Brain: Cerebral volume is normal. 1.8 x 1.4 x 1.4 cm hyperdense and partially calcified cortically based mass/lesion within the posterior left frontal lobe. There is no acute intracranial hemorrhage. No demarcated cortical infarct. No extra-axial fluid collection. No midline shift. Vascular: No hyperdense vessel. Skull: Normal. Negative for fracture or focal lesion. Sinuses/Orbits: Visualized orbits show no acute finding. No significant paranasal sinus disease at the imaged levels. CT CERVICAL SPINE FINDINGS Alignment: Cervical dextrocurvature. Straightening of the expected cervical lordosis. No significant spondylolisthesis. Skull base and vertebrae: The basion-dental and atlanto-dental intervals are maintained.No evidence of acute fracture to the cervical spine. Soft tissues and spinal canal: No prevertebral fluid or swelling. No visible canal hematoma. Disc levels: No significant bony spinal canal or neural foraminal narrowing. Upper chest: No consolidation within the imaged lung apices. No visible  pneumothorax. CT head results and recommendations called by telephone at the time of interpretation on 02/14/2021 at 10:52 am to provider [facility] , who verbally acknowledged these results. IMPRESSION: CT head: 1. 1.8 x 1.4 cm hyperdense and partially calcified cortically-based mass/lesion within the left frontal lobe. A contrast-enhanced brain MRI is recommended for further characterization. 2. Otherwise unremarkable non-contrast CT appearance of the brain. CT cervical spine: 1. No evidence of acute fracture to the cervical spine. 2. Nonspecific straightening of the expected cervical lordosis. 3. Cervical dextrocurvature. Electronically Signed   By: Jackey Loge DO   On: 02/14/2021 11:05   MR BRAIN W WO CONTRAST  Result Date: 02/14/2021 CLINICAL DATA:  Brain mass or  lesion.  Seizure-like activity. EXAM: MRI HEAD WITHOUT AND WITH CONTRAST TECHNIQUE: Multiplanar, multiecho pulse sequences of the brain and surrounding structures were obtained without and with intravenous contrast. CONTRAST:  40mL GADAVIST GADOBUTROL 1 MMOL/ML IV SOLN COMPARISON:  None. FINDINGS: Brain: No acute infarct, mass effect or extra-axial collection. There is a hyperintense T2-weighted signal lesion at the left frontoparietal junction with associated magnetic susceptibility effects, measuring 1.6 x 1.2 cm. There is no surrounding edema. There is minimal associated enhancement. Normal white matter signal, parenchymal volume and CSF spaces. The midline structures are normal. Vascular: Major flow voids are preserved. Skull and upper cervical spine: Normal calvarium and skull base. Visualized upper cervical spine and soft tissues are normal. Sinuses/Orbits:No paranasal sinus fluid levels or advanced mucosal thickening. No mastoid or middle ear effusion. Normal orbits. 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 No surrounding edema. Electronically Signed   By: Deatra Robinson M.D.   On: 02/14/2021 23:37     EKG: I have personally reviewed.  Sinus rhythm, QTC 428, LAE, early R wave progression.  Assessment/Plan Principal Problem:   DKA (diabetic ketoacidosis) (HCC) Active Problems:   Seizure (HCC)   AKI (acute kidney injury) (HCC)   Diabetes mellitus without complication (HCC)   Overdose of trazodone   Depression   DKA: blood sugar 433, anion gap 27, bicarbonate 9, pH 7.11 on VBG, beta hydroxybutyric acid > 8.0, positive ketones 80 urinalysis:  - Admit to stepdown  - IVF:  2L of LS bolus - start DKA protocol with BMP q4h - IVF: LR at 125 cc/h, will switch to D5-LR at 125 cc/h when CBG<250 - replete K as needed - Zofran prn nausea  - NPO  - consult to diabetic educator  Addendum: AG is closed by repeated BMP -transition to  started Lanuts 35 units daily and SSI  Seizure: Unclear etiology, no history of seizure.  May be related to cocaine and alcohol use.  No focal neurologic deficit on physical examination currently. -Seizure precaution -When necessary Ativan for seizure -EEG -f/u CT-head -UDS -Per [facility] statutes, patients with seizures are not allowed to drive until they have been seizure-free for six months. Use caution when using heavy equipment or power tools. Avoid working on ladders or at heights. Take showers instead of baths. Ensure the water temperature is not too high on the home water heater. Do not go swimming alone. Do not lock yourself in a room alone (i.e. bathroom). When caring for infants or small children, sit down when holding, feeding, or changing them to minimize risk of injury to the child in the event you have a seizure. Maintain good sleep hygiene. Avoid alcohol and drugs   Addendum: CT of her C-spine is negative for acute issues,  but the CT of head showed a 1.8 x 1.4 cm hyperdense and partially calcified cortically-based mass/lesion within the left frontal lobe.  -will get MRI of brain with contrast -will load 1000 mg of Keppra now  -will consult neurology, Dr. Amada Jupiter and neurosurgeon, Dr. Adriana Simas  -Addendunm: per Dr. 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 Will need neuro consult for seizures, but he can see and set him up to see pt in clinic in future  AKI (acute kidney injury) (HCC): Likely due to dehydration. -On IVF  Diabetes mellitus without complication_type I: Recent A1c 10.1, poorly controlled.  Patient still taking Humalog and Lantus at home, now has DKA. -On DKA protocol now  Overdose of trazodone: Patient took 200 mg of trazodone.  QTC 428 on initial EKG -Will repeat EKG and monitor QTc interval -Hold Zoloft  Depression -Low-dose Zoloft due to overdose of trazodone and risk of QTC prolongation     DVT ppx: SQ Lovenox Code Status: Full  code Family Communication: Yes, patient's girlfriend at bed side Disposition Plan:  Anticipate discharge back to previous environment Consults called:neurology, Dr. Amada Jupiter and neurosurgeon, Dr. Adriana Simas Admission status and Level of care: Med-Surg:   SDU/inpation          Status is: Inpatient  Remains inpatient appropriate because:Inpatient level of care appropriate due to severity of illness  Dispo: The patient is from: Home              Anticipated d/c is to: Home              Patient currently is not medically stable to d/c.   Difficult to place patient No          Date of Service 02/15/2021    Lorretta Harp [facility]   If 7PM-7AM, please contact night-coverage [email or web address] 02/15/2021, 7:10 AM

## 2021-02-15 DIAGNOSIS — Q283 Other malformations of cerebral vessels: Secondary | ICD-10-CM

## 2021-02-15 DIAGNOSIS — R569 Unspecified convulsions: Secondary | ICD-10-CM | POA: Diagnosis not present

## 2021-02-15 DIAGNOSIS — E1010 Type 1 diabetes mellitus with ketoacidosis without coma: Secondary | ICD-10-CM | POA: Diagnosis not present

## 2021-02-15 LAB — BASIC METABOLIC PANEL
Anion gap: 5 (ref 5–15)
BUN: 14 mg/dL (ref 6–20)
CO2: 24 mmol/L (ref 22–32)
Calcium: 8.4 mg/dL — ABNORMAL LOW (ref 8.9–10.3)
Chloride: 108 mmol/L (ref 98–111)
Creatinine, Ser: 0.56 mg/dL — ABNORMAL LOW (ref 0.61–1.24)
GFR, Estimated: >60 mL/min (ref >60)
Glucose, Bld: 168 mg/dL — ABNORMAL HIGH (ref 70–99)
Potassium: 3.4 mmol/L — ABNORMAL LOW (ref 3.5–5.1)
Sodium: 137 mmol/L (ref 135–145)

## 2021-02-15 LAB — HEMOGLOBIN A1C
Hgb A1c MFr Bld: 10.4 % — ABNORMAL HIGH (ref 4.8–5.6)
Mean Plasma Glucose: 251.78 mg/dL

## 2021-02-15 LAB — GLUCOSE, CAPILLARY
Glucose-Capillary: 207 mg/dL — ABNORMAL HIGH (ref 70–99)
Glucose-Capillary: 248 mg/dL — ABNORMAL HIGH (ref 70–99)

## 2021-02-15 MED ORDER — LEVETIRACETAM 500 MG PO TABS: 500.0000 mg | ORAL_TABLET | Freq: Two times a day (BID) | ORAL | 0 refills | Status: DC

## 2021-02-15 MED ORDER — INSULIN ASPART 100 UNIT/ML IJ SOLN
0.0000 [IU] | Freq: Three times a day (TID) | INTRAMUSCULAR | Status: DC
  Administered 2021-02-15: 5 [IU] via SUBCUTANEOUS
  Filled 2021-02-15: qty 1

## 2021-02-15 MED ORDER — POTASSIUM CHLORIDE CRYS ER 20 MEQ PO TBCR
40.0000 meq | EXTENDED_RELEASE_TABLET | Freq: Once | ORAL | Status: AC
  Administered 2021-02-15: 40 meq via ORAL
  Filled 2021-02-15: qty 2

## 2021-02-15 NOTE — Progress Notes (Signed)
Eeg done 

## 2021-02-15 NOTE — Discharge Summary (Signed)
Physician Discharge Summary  Jesus Ewing ZOX:[id number] DOB: 05/19/2001 DOA: 02/14/2021  PCP: [facility]  Admit date: 02/14/2021 Discharge date: 02/15/2021  Admitted From: Home Disposition: Home  Recommendations for Outpatient Follow-up:  Follow up with PCP in 1-2 weeks Follow-up with neurology Follow-up neurosurgery  Home Health: No Equipment/Devices: None  Discharge Condition: Stable CODE STATUS: Full Diet recommendation: Carb modified  Brief/Interim Summary: 20 y.o. male with medical history significant of type 1 diabetes, cocaine abuse, celiac disease, depression, who presents with seizure-like activity.   Per his girlfriend at bedside, patient was found to be "lock up" at about 3:00 AM, with whole body shaking, not talking, eyes staring backward, which lasted for about 15 minutes.  After that, patient was confused.  Patient does not have unilateral numbness or tingling symptoms in extremities, no facial droop or slurred speech.  Patient does not have history of seizure in past. Patient took 4 piils of 50 mg of trazodone to try to help him sleep.  Girlfriend states that patient currently is not drinking alcohol, but per nurse report, patient told nurse that he drank alcohol in weekend party.  He also told the nurse that he used cocaine.  When I saw pt in ED, he is sleeping, but arousable.  When aroused, patient is oriented x3.  Patient moves all extremities normally  Seen in consultation by neurology.  EEG ordered and completed.  EEG negative for epileptiform activity.  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  No surgical intervention warranted.  We will follow-up with neurosurgery in clinic 2 to 3 months.  Counseled on importance of substance abuse cessation and adherence to his insulin regimen.  Diabetes coronary consult appreciated.  Stable for discharge.   Discharge Diagnoses:  Principal Problem:   DKA (diabetic  ketoacidosis) (HCC) Active Problems:   Seizure (HCC)   AKI (acute kidney injury) (HCC)   Diabetes mellitus without complication (HCC)   Overdose of trazodone   Depression  Diabetic ketoacidosis Type 1 diabetes mellitus, poor control DKA resolved with insulin gtt.  Converted to subcutaneous regimen with good result.  Seen in consultation by diabetes coordinator.  Appreciate education.  Discharged home.  No changes made home medication regimen.  New onset seizure Incidentally discovered cavernoma Unclear etiology.  No history of seizure.  Possible DKA versus cavernoma as trigger.  Seen in consultation by neurology.  EEG negative.  Keppra 500 twice daily at time of discharge.  Discharged home with follow-up outpatient neurology.  Counseled on driving cessation for 6 months.   Discharge Instructions  Discharge Instructions     Diet - low sodium heart healthy   Complete by: As directed    Increase activity slowly   Complete by: As directed       Allergies as of 02/15/2021   No Known Allergies      Medication List     STOP taking these medications    HYDROcodone-acetaminophen 5-325 MG tablet Commonly known as: NORCO/VICODIN   insulin aspart 100 UNIT/ML injection Commonly known as: novoLOG   insulin aspart protamine- aspart (70-30) 100 UNIT/ML injection Commonly known as: NOVOLOG MIX 70/30       TAKE these medications    cyclobenzaprine 5 MG tablet Commonly known as: FLEXERIL Take by mouth. Notes to patient: Not given in hospital   insulin lispro 100 UNIT/ML KwikPen Commonly known as: HUMALOG Inject 0-50 Units into the skin as directed.   Lantus SoloStar 100 UNIT/ML Solostar Pen Generic drug: insulin  glargine Inject 50 Units into the skin at bedtime.   levETIRAcetam 500 MG tablet Commonly known as: KEPPRA Take 1 tablet (500 mg total) by mouth 2 (two) times daily.   Quintabs Tabs Take 1 tablet by mouth daily.   sertraline 25 MG tablet Commonly known as:  ZOLOFT Take 25 mg by mouth daily. Notes to patient: Not given in hospital        Follow-up Information     Morene Crocker, MD. Go on 02/22/2021.   Specialty: Neurology Why: Nilda Calamity, PA-C;  Appt @ 1:30 pm Contact information: [address] [facility] [address] [facility] [place] [address] [phone]         Lucy Chris, MD. Go on 04/26/2021.   Specialty: Neurosurgery Why: Follow up in 2-3 months; Appt @ 2:00 pm Contact information: [address] [place] [address] [phone]         [facility]. Schedule an appointment as soon as possible for a visit in 1 week(s).   Contact information: [address] [facility] [address] [facility] [address] [place] [address] [phone]                No Known Allergies  Consultations: Neurology Neurosurgery   Procedures/Studies: CT HEAD WO CONTRAST  Result Date: 02/14/2021 CLINICAL DATA:  Seizure, abnormal neuro exam. Neck trauma, focal neuro deficit or paresthesia (age 100-64y). EXAM: CT HEAD WITHOUT CONTRAST CT CERVICAL SPINE WITHOUT CONTRAST TECHNIQUE: Multidetector CT imaging of the head and cervical spine was performed following the standard protocol without intravenous contrast. Multiplanar CT image reconstructions of the cervical spine were also generated. COMPARISON:  Cervical spine radiographs 02/14/2019. FINDINGS: CT HEAD FINDINGS Brain: Cerebral volume is normal. 1.8 x 1.4 x 1.4 cm hyperdense and partially calcified cortically based mass/lesion within the posterior left frontal lobe. There is no acute intracranial hemorrhage. No demarcated cortical infarct. No extra-axial fluid collection. No midline shift. Vascular: No hyperdense vessel. Skull: Normal. Negative for fracture or focal lesion. Sinuses/Orbits: Visualized orbits show no acute finding. No significant paranasal sinus disease at the imaged levels. CT CERVICAL SPINE FINDINGS Alignment: Cervical dextrocurvature. Straightening of  the expected cervical lordosis. No significant spondylolisthesis. Skull base and vertebrae: The basion-dental and atlanto-dental intervals are maintained.No evidence of acute fracture to the cervical spine. Soft tissues and spinal canal: No prevertebral fluid or swelling. No visible canal hematoma. Disc levels: No significant bony spinal canal or neural foraminal narrowing. Upper chest: No consolidation within the imaged lung apices. No visible pneumothorax. CT head results and recommendations called by telephone at the time of interpretation on 02/14/2021 at 10:52 am to provider [facility] , who verbally acknowledged these results. IMPRESSION: CT head: 1. 1.8 x 1.4 cm hyperdense and partially calcified cortically-based mass/lesion within the left frontal lobe. A contrast-enhanced brain MRI is recommended for further characterization. 2. Otherwise unremarkable non-contrast CT appearance of the brain. CT cervical spine: 1. No evidence of acute fracture to the cervical spine. 2. Nonspecific straightening of the expected cervical lordosis. 3. Cervical dextrocurvature. Electronically Signed   By: Jackey Loge DO   On: 02/14/2021 11:05   CT Cervical Spine Wo Contrast  Result Date: 02/14/2021 CLINICAL DATA:  Seizure, abnormal neuro exam. Neck trauma, focal neuro deficit or paresthesia (age 77-64y). EXAM: CT HEAD WITHOUT CONTRAST CT CERVICAL SPINE WITHOUT CONTRAST TECHNIQUE: Multidetector CT imaging of the head and cervical spine was performed following the standard protocol without intravenous contrast. Multiplanar CT image reconstructions of the cervical spine were also generated. COMPARISON:  Cervical spine radiographs 02/14/2019. FINDINGS: CT HEAD FINDINGS Brain: Cerebral volume is normal. 1.8 x 1.4 x 1.4 cm hyperdense and partially calcified cortically based mass/lesion within the posterior left frontal lobe. There is no acute intracranial hemorrhage. No demarcated cortical infarct. No extra-axial fluid  collection. No midline shift. Vascular: No hyperdense vessel. Skull: Normal. Negative for fracture or focal lesion. Sinuses/Orbits: Visualized orbits show no acute finding. No significant paranasal sinus disease at the imaged levels. CT CERVICAL SPINE FINDINGS Alignment: Cervical dextrocurvature. Straightening of the expected cervical lordosis. No significant spondylolisthesis. Skull base and vertebrae: The basion-dental and atlanto-dental intervals are maintained.No evidence of acute fracture to the cervical spine. Soft tissues and spinal canal: No prevertebral fluid or swelling. No visible canal hematoma. Disc levels: No significant bony spinal canal or neural foraminal narrowing. Upper chest: No consolidation within the imaged lung apices. No visible pneumothorax. CT head results and recommendations called by telephone at the time of interpretation on 02/14/2021 at 10:52 am to provider [facility] , who verbally acknowledged these results. IMPRESSION: CT head: 1. 1.8 x 1.4 cm hyperdense and partially calcified cortically-based mass/lesion within the left frontal lobe. A contrast-enhanced brain MRI is recommended for further characterization. 2. Otherwise unremarkable non-contrast CT appearance of the brain. CT cervical spine: 1. No evidence of acute fracture to the cervical spine. 2. Nonspecific straightening of the expected cervical lordosis. 3. Cervical dextrocurvature. Electronically Signed   By: Jackey Loge DO   On: 02/14/2021 11:05   MR BRAIN W WO CONTRAST  Result Date: 02/14/2021 CLINICAL DATA:  Brain mass or lesion.  Seizure-like activity. EXAM: MRI HEAD WITHOUT AND WITH CONTRAST TECHNIQUE: Multiplanar, multiecho pulse sequences of the brain and surrounding structures were obtained without and with intravenous contrast. CONTRAST:  7mL GADAVIST GADOBUTROL 1 MMOL/ML IV SOLN COMPARISON:  None. FINDINGS: Brain: No acute infarct, mass effect or extra-axial collection. There is a hyperintense T2-weighted  signal lesion at the left frontoparietal junction with associated magnetic susceptibility effects, measuring 1.6 x 1.2 cm. There is no surrounding edema. There is minimal associated enhancement. Normal white matter signal, parenchymal volume and CSF spaces. The midline structures are normal. Vascular: Major flow voids are preserved. Skull and upper cervical spine: Normal calvarium and skull base. Visualized upper cervical spine and soft tissues are normal. Sinuses/Orbits:No paranasal sinus fluid levels or advanced mucosal thickening. No mastoid or middle ear effusion. Normal orbits. 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 No surrounding edema. Electronically Signed   By: Deatra Robinson M.D.   On: 02/14/2021 23:37   EEG adult  Result Date: 02/15/2021 Charlsie Quest, MD     02/15/2021 12:39 PM Patient Name: Sender Rueb MRN: [id number] Epilepsy Attending: Charlsie Quest Referring Physician/Provider: Dr Lorretta Harp Date: 02/15/2021 Duration: 25.39 mins Patient history: 20yo m with first time seizure. EEG to evaluate for seizure Level of alertness: Asleep AEDs during EEG study: LEV Technical aspects: This EEG study was done with scalp electrodes positioned according to the 10-20 International system of electrode placement. Electrical activity was acquired at a sampling rate of  and reviewed with a high frequency filter of  and a low frequency filter of . EEG data were recorded continuously and digitally stored. Description: Sleep was characterized by vertex waves, sleep spindles (12 to 14 Hz), maximal frontocentral region.  Hyperventilation and photic stimulation were not performed.   IMPRESSION: This study during sleep only is within normal limits. No seizures or epileptiform discharges were seen  throughout the recording. Priyanka Annabelle Harman   (Echo, Carotid, EGD, Colonoscopy, ERCP)    Subjective: Seen and examined on the day of  discharge.  Stable in no distress.  Stable for discharge home.  Discharge Exam: Vitals:   02/15/21 0423 02/15/21 0831  BP: (!) 99/55 109/65  Pulse: 64 76  Resp: 15 17  Temp: 97.7 F (36.5 C) 97.7 F (36.5 C)  SpO2: 100% 100%   Vitals:   02/14/21 2205 02/15/21 0023 02/15/21 0423 02/15/21 0831  BP: (!) 110/57 (!) 102/55 (!) 99/55 109/65  Pulse: 88 75 64 76  Resp: 16 15 15 17   Temp: 97.8 F (36.6 C) 97.6 F (36.4 C) 97.7 F (36.5 C) 97.7 F (36.5 C)  TempSrc:    Oral  SpO2: 99% 98% 100% 100%  Weight: 67.2 kg     Height: 5\' 10"  (1.778 m)       General: Pt is alert, awake, not in acute distress Cardiovascular: RRR, S1/S2 +, no rubs, no gallops Respiratory: CTA bilaterally, no wheezing, no rhonchi Abdominal: Soft, NT, ND, bowel sounds + Extremities: no edema, no cyanosis    The results of significant diagnostics from this hospitalization (including imaging, microbiology, ancillary and laboratory) are listed below for reference.     Microbiology: Recent Results (from the past 240 hour(s))  Resp Panel by RT-PCR (Flu A&B, Covid) Nasopharyngeal Swab     Status: None   Collection Time: 02/14/21  6:48 AM   Specimen: Nasopharyngeal Swab; Nasopharyngeal(NP) swabs in vial transport medium  Result Value Ref Range Status   SARS Coronavirus 2 by RT PCR NEGATIVE NEGATIVE Final    Comment: (NOTE) SARS-CoV-2 target nucleic acids are NOT DETECTED.  The SARS-CoV-2 RNA is generally detectable in upper respiratory specimens during the acute phase of infection. The lowest concentration of SARS-CoV-2 viral copies this assay can detect is 138 copies/mL. A negative result does not preclude SARS-Cov-2 infection and should not be used as the sole basis for treatment or other patient management decisions. A negative result may occur with  improper specimen collection/handling, submission of specimen other than nasopharyngeal swab, presence of viral mutation(s) within the areas targeted by  this assay, and inadequate number of viral copies(<138 copies/mL). A negative result must be combined with clinical observations, patient history, and epidemiological information. The expected result is Negative.  Fact Sheet for Patients:   Fact Sheet for Healthcare Providers:  04/16/21  This test is no t yet approved or cleared by the [employer] FDA and  has been authorized for detection and/or diagnosis of SARS-CoV-2 by FDA under an Emergency Use Authorization (EUA). This EUA will remain  in effect (meaning this test can be used) for the duration of the COVID-19 declaration under Section 564(b)(1) of the Act, 21 U.S.C.section 360bbb-3(b)(1), unless the authorization is terminated  or revoked sooner.       Influenza A by PCR NEGATIVE NEGATIVE Final   Influenza B by PCR NEGATIVE NEGATIVE Final    Comment: (NOTE) The Xpert Xpress SARS-CoV-2/FLU/RSV plus assay is intended as an aid in the diagnosis of influenza from Nasopharyngeal swab specimens and should not be used as a sole basis for treatment. Nasal washings and aspirates are unacceptable for Xpert Xpress SARS-CoV-2/FLU/RSV testing.  Fact Sheet for Patients: SeriousBroker.it  Fact Sheet for Healthcare Providers: [place]  This test is not yet approved or cleared by the [employer] FDA and has been authorized for detection and/or diagnosis of SARS-CoV-2 by FDA under an Emergency Use Authorization (  EUA). This EUA will remain in effect (meaning this test can be used) for the duration of the COVID-19 declaration under Section 564(b)(1) of the Act, 21 U.S.C. section 360bbb-3(b)(1), unless the authorization is terminated or revoked.  Performed at [facility], [address] [place], [place] [address]      Labs: BNP (last 3 results) No results for input(s): BNP in the last  8760 hours. Basic Metabolic Panel: Recent Labs  Lab 02/14/21 0938 02/14/21 1126 02/14/21 1334 02/14/21 1813 02/15/21 0405  NA 134* 134* 134* 137 137  K 4.1 3.9 3.7 3.7 3.4*  CL 105 106 106 108 108  CO2 16* 15* 18* 21* 24  GLUCOSE 166* 160* 140* 137* 168*  BUN 16 15 15 13 14   CREATININE 0.92 0.79 0.84 0.60* 0.56*  CALCIUM 8.0* 8.0* 8.0* 8.6* 8.4*   Liver Function Tests: No results for input(s): AST, ALT, ALKPHOS, BILITOT, PROT, ALBUMIN in the last 168 hours. No results for input(s): LIPASE, AMYLASE in the last 168 hours. No results for input(s): AMMONIA in the last 168 hours. CBC: Recent Labs  Lab 02/14/21 0518  WBC 9.4  HGB 16.9  HCT 49.1  MCV 98.6  PLT 307   Cardiac Enzymes: No results for input(s): CKTOTAL, CKMB, CKMBINDEX, TROPONINI in the last 168 hours. BNP: Invalid input(s): POCBNP CBG: Recent Labs  Lab 02/14/21 1643 02/14/21 1742 02/14/21 1908 02/14/21 2209 02/15/21 0810  GLUCAP 164* 142* 119* 279* 207*   D-Dimer No results for input(s): DDIMER in the last 72 hours. Hgb A1c Recent Labs    02/15/21 0406  HGBA1C 10.4*   Lipid Profile No results for input(s): CHOL, HDL, LDLCALC, TRIG, CHOLHDL, LDLDIRECT in the last 72 hours. Thyroid function studies No results for input(s): TSH, T4TOTAL, T3FREE, THYROIDAB in the last 72 hours.  Invalid input(s): FREET3 Anemia work up No results for input(s): VITAMINB12, FOLATE, FERRITIN, TIBC, IRON, RETICCTPCT in the last 72 hours. Urinalysis    Component Value Date/Time   COLORURINE STRAW (A) 02/14/2021 0518   APPEARANCEUR CLEAR (A) 02/14/2021 0518   LABSPEC 1.024 02/14/2021 0518   PHURINE 5.0 02/14/2021 0518   GLUCOSEU >=500 (A) 02/14/2021 0518   HGBUR NEGATIVE 02/14/2021 0518   BILIRUBINUR NEGATIVE 02/14/2021 0518   KETONESUR 80 (A) 02/14/2021 0518   PROTEINUR NEGATIVE 02/14/2021 0518   NITRITE NEGATIVE 02/14/2021 0518   LEUKOCYTESUR NEGATIVE 02/14/2021 0518   Sepsis Labs Invalid input(s):  PROCALCITONIN,  WBC,  LACTICIDVEN Microbiology Recent Results (from the past 240 hour(s))  Resp Panel by RT-PCR (Flu A&B, Covid) Nasopharyngeal Swab     Status: None   Collection Time: 02/14/21  6:48 AM   Specimen: Nasopharyngeal Swab; Nasopharyngeal(NP) swabs in vial transport medium  Result Value Ref Range Status   SARS Coronavirus 2 by RT PCR NEGATIVE NEGATIVE Final    Comment: (NOTE) SARS-CoV-2 target nucleic acids are NOT DETECTED.  The SARS-CoV-2 RNA is generally detectable in upper respiratory specimens during the acute phase of infection. The lowest concentration of SARS-CoV-2 viral copies this assay can detect is 138 copies/mL. A negative result does not preclude SARS-Cov-2 infection and should not be used as the sole basis for treatment or other patient management decisions. A negative result may occur with  improper specimen collection/handling, submission of specimen other than nasopharyngeal swab, presence of viral mutation(s) within the areas targeted by this assay, and inadequate number of viral copies(<138 copies/mL). A negative result must be combined with clinical observations, patient history, and epidemiological information. The expected result  is Negative.  Fact Sheet for Patients:  BloggerCourse.com  Fact Sheet for Healthcare Providers:  SeriousBroker.it  This test is no t yet approved or cleared by the [place] FDA and  has been authorized for detection and/or diagnosis of SARS-CoV-2 by FDA under an Emergency Use Authorization (EUA). This EUA will remain  in effect (meaning this test can be used) for the duration of the COVID-19 declaration under Section 564(b)(1) of the Act, 21 U.S.C.section 360bbb-3(b)(1), unless the authorization is terminated  or revoked sooner.       Influenza A by PCR NEGATIVE NEGATIVE Final   Influenza B by PCR NEGATIVE NEGATIVE Final    Comment: (NOTE) The Xpert Xpress  SARS-CoV-2/FLU/RSV plus assay is intended as an aid in the diagnosis of influenza from Nasopharyngeal swab specimens and should not be used as a sole basis for treatment. Nasal washings and aspirates are unacceptable for Xpert Xpress SARS-CoV-2/FLU/RSV testing.  Fact Sheet for Patients: BloggerCourse.com  Fact Sheet for Healthcare Providers: SeriousBroker.it  This test is not yet approved or cleared by the [place] FDA and has been authorized for detection and/or diagnosis of SARS-CoV-2 by FDA under an Emergency Use Authorization (EUA). This EUA will remain in effect (meaning this test can be used) for the duration of the COVID-19 declaration under Section 564(b)(1) of the Act, 21 U.S.C. section 360bbb-3(b)(1), unless the authorization is terminated or revoked.  Performed at [facility], [address] [place], [place] [address]      Time coordinating discharge: Over 30 minutes  SIGNED:   Tresa Moore, MD  [facility] 02/15/2021, 1:05 PM Pager   If 7PM-7AM, please contact night-coverage

## 2021-02-15 NOTE — Progress Notes (Addendum)
NEUROLOGY PROGRESS NOTE   SUBJECTIVE (INTERVAL HISTORY) No family is at the bedside.  Mom on the phone. Overall his condition is completely resolved. No seizure overnight. EEG normal. MRI showed left frontal cavernoma. NSG on board, recommend outpt follow up. Continue keppra.    OBJECTIVE Temp:  [97.6 F (36.4 C)-98.1 F (36.7 C)] 97.7 F (36.5 C) (08/02 0831) Pulse Rate:  [64-88] 76 (08/02 0831) Cardiac Rhythm: Normal sinus rhythm (08/02 0732) Resp:  [14-19] 17 (08/02 0831) BP: (98-111)/(52-86) 109/65 (08/02 0831) SpO2:  [96 %-100 %] 100 % (08/02 0831) Weight:  [67.2 kg] 67.2 kg (08/01 2205)  Recent Labs  Lab 02/14/21 1643 02/14/21 1742 02/14/21 1908 02/14/21 2209 02/15/21 0810  GLUCAP 164* 142* 119* 279* 207*   Recent Labs  Lab 02/14/21 0938 02/14/21 1126 02/14/21 1334 02/14/21 1813 02/15/21 0405  NA 134* 134* 134* 137 137  K 4.1 3.9 3.7 3.7 3.4*  CL 105 106 106 108 108  CO2 16* 15* 18* 21* 24  GLUCOSE 166* 160* 140* 137* 168*  BUN 16 15 15 13 14   CREATININE 0.92 0.79 0.84 0.60* 0.56*  CALCIUM 8.0* 8.0* 8.0* 8.6* 8.4*   No results for input(s): AST, ALT, ALKPHOS, BILITOT, PROT, ALBUMIN in the last 168 hours. Recent Labs  Lab 02/14/21 0518  WBC 9.4  HGB 16.9  HCT 49.1  MCV 98.6  PLT 307   No results for input(s): CKTOTAL, CKMB, CKMBINDEX, TROPONINI in the last 168 hours. No results for input(s): LABPROT, INR in the last 72 hours. Recent Labs    02/14/21 0518  COLORURINE STRAW*  LABSPEC 1.024  PHURINE 5.0  GLUCOSEU >=500*  HGBUR NEGATIVE  BILIRUBINUR NEGATIVE  KETONESUR 80*  PROTEINUR NEGATIVE  NITRITE NEGATIVE  LEUKOCYTESUR NEGATIVE    No results found for: CHOL, TRIG, HDL, CHOLHDL, VLDL, LDLCALC Lab Results  Component Value Date   HGBA1C 10.4 (H) 02/15/2021      Component Value Date/Time   LABOPIA NONE DETECTED 02/14/2021 1522   COCAINSCRNUR POSITIVE (A) 02/14/2021 1522   LABBENZ NONE DETECTED 02/14/2021 1522   AMPHETMU POSITIVE (A)  02/14/2021 1522   THCU NONE DETECTED 02/14/2021 1522   LABBARB NONE DETECTED 02/14/2021 1522    No results for input(s): ETH in the last 168 hours.  I have personally reviewed the radiological images below and agree with the radiology interpretations.  CT HEAD WO CONTRAST  Result Date: 02/14/2021 CLINICAL DATA:  Seizure, abnormal neuro exam. Neck trauma, focal neuro deficit or paresthesia (age 22-64y). EXAM: CT HEAD WITHOUT CONTRAST CT CERVICAL SPINE WITHOUT CONTRAST TECHNIQUE: Multidetector CT imaging of the head and cervical spine was performed following the standard protocol without intravenous contrast. Multiplanar CT image reconstructions of the cervical spine were also generated. COMPARISON:  Cervical spine radiographs 02/14/2019. FINDINGS: CT HEAD FINDINGS Brain: Cerebral volume is normal. 1.8 x 1.4 x 1.4 cm hyperdense and partially calcified cortically based mass/lesion within the posterior left frontal lobe. There is no acute intracranial hemorrhage. No demarcated cortical infarct. No extra-axial fluid collection. No midline shift. Vascular: No hyperdense vessel. Skull: Normal. Negative for fracture or focal lesion. Sinuses/Orbits: Visualized orbits show no acute finding. No significant paranasal sinus disease at the imaged levels. CT CERVICAL SPINE FINDINGS Alignment: Cervical dextrocurvature. Straightening of the expected cervical lordosis. No significant spondylolisthesis. Skull base and vertebrae: The basion-dental and atlanto-dental intervals are maintained.No evidence of acute fracture to the cervical spine. Soft tissues and spinal canal: No prevertebral fluid or swelling. No visible canal hematoma. Disc levels: No  significant bony spinal canal or neural foraminal narrowing. Upper chest: No consolidation within the imaged lung apices. No visible pneumothorax. CT head results and recommendations called by telephone at the time of interpretation on 02/14/2021 at 10:52 am to provider [facility] , who verbally acknowledged these results. IMPRESSION: CT head: 1. 1.8 x 1.4 cm hyperdense and partially calcified cortically-based mass/lesion within the left frontal lobe. A contrast-enhanced brain MRI is recommended for further characterization. 2. Otherwise unremarkable non-contrast CT appearance of the brain. CT cervical spine: 1. No evidence of acute fracture to the cervical spine. 2. Nonspecific straightening of the expected cervical lordosis. 3. Cervical dextrocurvature. Electronically Signed   By: Jackey Loge DO   On: 02/14/2021 11:05   CT Cervical Spine Wo Contrast  Result Date: 02/14/2021 CLINICAL DATA:  Seizure, abnormal neuro exam. Neck trauma, focal neuro deficit or paresthesia (age 22-64y). EXAM: CT HEAD WITHOUT CONTRAST CT CERVICAL SPINE WITHOUT CONTRAST TECHNIQUE: Multidetector CT imaging of the head and cervical spine was performed following the standard protocol without intravenous contrast. Multiplanar CT image reconstructions of the cervical spine were also generated. COMPARISON:  Cervical spine radiographs 02/14/2019. FINDINGS: CT HEAD FINDINGS Brain: Cerebral volume is normal. 1.8 x 1.4 x 1.4 cm hyperdense and partially calcified cortically based mass/lesion within the posterior left frontal lobe. There is no acute intracranial hemorrhage. No demarcated cortical infarct. No extra-axial fluid collection. No midline shift. Vascular: No hyperdense vessel. Skull: Normal. Negative for fracture or focal lesion. Sinuses/Orbits: Visualized orbits show no acute finding. No significant paranasal sinus disease at the imaged levels. CT CERVICAL SPINE FINDINGS Alignment: Cervical dextrocurvature. Straightening of the expected cervical lordosis. No significant spondylolisthesis. Skull base and vertebrae: The basion-dental and atlanto-dental intervals are maintained.No evidence of acute fracture to the cervical spine. Soft tissues and spinal canal: No prevertebral fluid or swelling. No visible  canal hematoma. Disc levels: No significant bony spinal canal or neural foraminal narrowing. Upper chest: No consolidation within the imaged lung apices. No visible pneumothorax. CT head results and recommendations called by telephone at the time of interpretation on 02/14/2021 at 10:52 am to provider [facility] , who verbally acknowledged these results. IMPRESSION: CT head: 1. 1.8 x 1.4 cm hyperdense and partially calcified cortically-based mass/lesion within the left frontal lobe. A contrast-enhanced brain MRI is recommended for further characterization. 2. Otherwise unremarkable non-contrast CT appearance of the brain. CT cervical spine: 1. No evidence of acute fracture to the cervical spine. 2. Nonspecific straightening of the expected cervical lordosis. 3. Cervical dextrocurvature. Electronically Signed   By: Jackey Loge DO   On: 02/14/2021 11:05   MR BRAIN W WO CONTRAST  Result Date: 02/14/2021 CLINICAL DATA:  Brain mass or lesion.  Seizure-like activity. EXAM: MRI HEAD WITHOUT AND WITH CONTRAST TECHNIQUE: Multiplanar, multiecho pulse sequences of the brain and surrounding structures were obtained without and with intravenous contrast. CONTRAST:  12mL GADAVIST GADOBUTROL 1 MMOL/ML IV SOLN COMPARISON:  None. FINDINGS: Brain: No acute infarct, mass effect or extra-axial collection. There is a hyperintense T2-weighted signal lesion at the left frontoparietal junction with associated magnetic susceptibility effects, measuring 1.6 x 1.2 cm. There is no surrounding edema. There is minimal associated enhancement. Normal white matter signal, parenchymal volume and CSF spaces. The midline structures are normal. Vascular: Major flow voids are preserved. Skull and upper cervical spine: Normal calvarium and skull base. Visualized upper cervical spine and soft tissues are normal. Sinuses/Orbits:No paranasal sinus fluid levels or advanced mucosal thickening. No mastoid or middle ear effusion.  Normal orbits. 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 No surrounding edema. Electronically Signed   By: Deatra Robinson M.D.   On: 02/14/2021 23:37   EEG adult  Result Date: 02/15/2021 Charlsie Quest, MD     02/15/2021 12:39 PM Patient Name: Alecxander Mainwaring MRN: [id number] Epilepsy Attending: Charlsie Quest Referring Physician/Provider: Dr Lorretta Harp Date: 02/15/2021 Duration: 25.39 mins Patient history: 20yo m with first time seizure. EEG to evaluate for seizure Level of alertness: Asleep AEDs during EEG study: LEV Technical aspects: This EEG study was done with scalp electrodes positioned according to the 10-20 International system of electrode placement. Electrical activity was acquired at a sampling rate of 500Hz  and reviewed with a high frequency filter of 70Hz  and a low frequency filter of 1Hz . EEG data were recorded continuously and digitally stored. Description: Sleep was characterized by vertex waves, sleep spindles (12 to 14 Hz), maximal frontocentral region.  Hyperventilation and photic stimulation were not performed.   IMPRESSION: This study during sleep only is within normal limits. No seizures or epileptiform discharges were seen throughout the recording. Priyanka      PHYSICAL EXAM  Temp:  [97.6 F (36.4 C)-98.1 F (36.7 C)] 97.7 F (36.5 C) (08/02 0831) Pulse Rate:  [64-88] 76 (08/02 0831) Resp:  [14-19] 17 (08/02 0831) BP: (98-111)/(52-86) 109/65 (08/02 0831) SpO2:  [96 %-100 %] 100 % (08/02 0831) Weight:  [67.2 kg] 67.2 kg (08/01 2205)  General - Well nourished, well developed, in no apparent distress.  Ophthalmologic - fundi not visualized due to noncooperation.  Cardiovascular - Regular rhythm and rate.  Mental Status -  Level of arousal and orientation to time, place, and person were intact. Language including expression, naming, repetition, comprehension was assessed and found intact. Attention span and concentration  were normal. Recent and remote memory were intact. Fund of Knowledge was assessed and was intact.  Cranial Nerves II - XII - II - Visual field intact OU. III, IV, VI - Extraocular movements intact. V - Facial sensation intact bilaterally. VII - Facial movement intact bilaterally. VIII - Hearing & vestibular intact bilaterally. X - Palate elevates symmetrically. XI - Chin turning & shoulder shrug intact bilaterally. XII - Tongue protrusion intact.  Motor Strength - The patient's strength was normal in all extremities and pronator drift was absent.  Bulk was normal and fasciculations were absent.   Motor Tone - Muscle tone was assessed at the neck and appendages and was normal.  Reflexes - The patient's reflexes were symmetrical in all extremities and he had no pathological reflexes.  Sensory - Light touch, temperature/pinprick were assessed and were symmetrical.    Coordination - The patient had normal movements in the hands and feet with no ataxia or dysmetria.  Tremor was absent.  Gait and Station - deferred.   ASSESSMENT Mr. Abdulraheem Pineo is a 20 y.o. male with a history of diabetes with seizure in the setting of recent cocaine use and DKA. UDS positive for cocaine and meth.  also found to have left frontal calcified lesion on CT. MRI done showed left frontal cavernoma. EEG normal. NSG consulted will follow up as outpt. His seizure could be due to left frontal cavernoma in the setting of cocaine use and DKA which could lower the seizure threshold. Pt was educated on DM management and avoid cocaine/meth or other illicit drugs. Will continue keppra on discharge. Follow up with Dr. [id number] at [place] [facility].  Recommendations: 1) continue Keppra 500 mg twice daily on discharge 2) [place] [facility] [place] no driving until seizure free for 6 months and under neurology care. I discussed this with the patient at the bedside and his mom over the phone who expressed  understanding. 3) Seizure precaution. 4) DM control 5) avoid illicit drugs 6) follow up with NSG Dr. Adriana Simasook as outpt 7) neurology Dr Phylis BougiePottery will follow as outpt.   Hospital day # 1  Neurology will sign off. Please call with questions. Pt [facility]21 Thanks for the consult.   Marvel PlanJindong Brennen Camper, MD PhD Stroke Neurology 02/15/2021 1:04 PM    To contact Stroke Continuity provider, please refer to [email or web address]. After hours, contact General Neurology

## 2021-02-15 NOTE — Procedures (Signed)
Patient Name: Jesus Ewing  MRN: [id number]  Epilepsy Attending: Charlsie Quest  Referring Physician/Provider: Dr Lorretta Harp Date: 02/15/2021 Duration: 25.39 mins  Patient history: 20yo m with first time seizure. EEG to evaluate for seizure  Level of alertness: Asleep  AEDs during EEG study: LEV  Technical aspects: This EEG study was done with scalp electrodes positioned according to the 10-20 International system of electrode placement. Electrical activity was acquired at a sampling rate of 500Hz  and reviewed with a high frequency filter of 70Hz  and a low frequency filter of 1Hz . EEG data were recorded continuously and digitally stored.   Description: Sleep was characterized by vertex waves, sleep spindles (12 to 14 Hz), maximal frontocentral region.  Hyperventilation and photic stimulation were not performed.     IMPRESSION: This study during sleep only is within normal limits. No seizures or epileptiform discharges were seen throughout the recording.  Willey Due 

## 2021-02-15 NOTE — Progress Notes (Addendum)
Inpatient Diabetes Program Recommendations  AACE/ADA: New Consensus Statement on Inpatient Glycemic Control (2015)  Target Ranges:  Prepandial:   less than 140 mg/dL      Peak postprandial:   less than 180 mg/dL (1-2 hours)      Critically ill patients:  140 - 180 mg/dL   Lab Results  Component Value Date   GLUCAP 207 (H) 02/15/2021   HGBA1C 10.1 (H) 05/26/2019    Review of Glycemic Control  Diabetes history: DM1(does not make insulin.  Needs correction, basal and meal coverage)  Outpatient Diabetes medications: Novolog 1-8 units TID with meals (1 unit for every 25 mg/dl >762 mg/dl + 1 unit for every 8 grams Carbohydrates) Lantus 45 units QHS Dexcom CGM Current orders for Inpatient glycemic control: Semglee 35 units QHS, Novolog 0-15 units TID and 0-5 QHS  Inpatient Diabetes Program Recommendations:    Please consider Novolog 4 units TID with meals as he has type 1 DM.  Addendum@1011 : Spoke with patient at bedside.  He confirms he is current with [facility].  He has appointments every 4 mos.  He states his last A1C was around 9%.  He admits to sometimes skipping insulin.  He denies difficulties obtaining insulins.  He wears a Dexcom and is not interested in a pump.  Encouraged him to work on getting his A1C less than 7% to avoid long and short term complications.  We discussed these complications.  He drinks water, Gatorade zero and Propel.  When he drinks alcohol he covers the CHO's.  Empathized with patient as T1DM is a difficult disease.  Encouraged him to administer insulins as prescribed and keep follow up appointment with endocrinology.    Will continue to follow while inpatient.  Thank you, Dulce Sellar, RN, BSN Diabetes Coordinator Inpatient Diabetes Program [phone] (team pager from 8a-5p)

## 2021-02-15 NOTE — Consult Note (Signed)
Neurosurgery-New Consultation Evaluation 02/15/2021 Jakolby Sedivy [id number]  Identifying Statement: Nazaiah Navarrete is a 20 y.o. male from [place] [address] with seizure  Physician Requesting Consultation: Dr Clyde Lundborg  History of Present Illness: Mr. Olesen is here for evaluation of a first-time seizure.  He states he does have a history of diabetes and there was concern for ketoacidosis on admission.  He states he has never had a seizure before.  He does not recall prior imaging of his head.  He denies any other symptoms and pacifically has had no weakness, numbness, speech problems, or vision changes.  He has no other significant medical history.  As part of the work-up, he did get a CT of the head which revealed a calcified lesion in the left frontoparietal area.  This was followed with an MRI.  We are consulted to go over the imaging and discuss treatment options.  Past Medical History:  Past Medical History:  Diagnosis Date   Celiac disease    Diabetes mellitus without complication (HCC)     Social History: Social History   Socioeconomic History   Marital status: Single    Spouse name: Not on file   Number of children: Not on file   Years of education: Not on file   Highest education level: Not on file  Occupational History   Not on file  Tobacco Use   Smoking status: Never   Smokeless tobacco: Never  Vaping Use   Vaping Use: Every day  Substance and Sexual Activity   Alcohol use: Yes   Drug use: Yes    Types: "Crack" cocaine   Sexual activity: Not on file  Other Topics Concern   Not on file  Social History Narrative   Not on file   Social Determinants of Health   Financial Resource Strain: Not on file  Food Insecurity: Not on file  Transportation Needs: Not on file  Physical Activity: Not on file  Stress: Not on file  Social Connections: Not on file  Intimate Partner Violence: Not on file     Family History: Family History  Family history  unknown: Yes    Review of Systems:  Review of Systems - General ROS: Negative Psychological ROS: Negative Ophthalmic ROS: Negative ENT ROS: Negative Hematological and Lymphatic ROS: Negative  Endocrine ROS: Negative Respiratory ROS: Negative Cardiovascular ROS: Negative Gastrointestinal ROS: Negative Genito-Urinary ROS: Negative Musculoskeletal ROS: Negative Neurological ROS: Positive for seizure Dermatological ROS: Negative  Physical Exam: BP 109/65 (BP Location: Left Arm)   Pulse 76   Temp 97.7 F (36.5 C) (Oral)   Resp 17   Ht 5\' 10"  (1.778 m)   Wt 67.2 kg   SpO2 100%   BMI 21.26 kg/m  Body mass index is 21.26 kg/m. Body surface area is 1.82 meters squared. General appearance: Alert, cooperative, in no acute distress Head: Normocephalic, atraumatic Eyes: Normal, EOM intact Oropharynx: Moist without lesions Ext: No edema in LE bilaterally  Neurologic exam:  Mental status: alertness: alert, orientation: person, place, time, affect: normal Speech: fluent and clear, naming and repetition are intact Cranial nerves:  II: Visual fields are full by confrontation, no ptosis III/IV/VI: extra-ocular motions intact bilaterally V/VII:no evidence of facial droop or weakness VIII: hearing normal XI: trapezius strength symmetric,  sternocleidomastoid strength symmetric XII: tongue strength symmetric  Motor:strength symmetric 5/5, normal muscle mass and tone in all extremities  Sensory: intact to light touch in all extremities Gait: Not tested  Laboratory: Results for orders placed or performed  during the hospital encounter of 02/14/21  Resp Panel by RT-PCR (Flu A&B, Covid) Nasopharyngeal Swab   Specimen: Nasopharyngeal Swab; Nasopharyngeal(NP) swabs in vial transport medium  Result Value Ref Range   SARS Coronavirus 2 by RT PCR NEGATIVE NEGATIVE   Influenza A by PCR NEGATIVE NEGATIVE   Influenza B by PCR NEGATIVE NEGATIVE  Basic metabolic panel - if new onset seizures   Result Value Ref Range   Sodium 135 135 - 145 mmol/L   Potassium 5.0 3.5 - 5.1 mmol/L   Chloride 99 98 - 111 mmol/L   CO2 9 (L) 22 - 32 mmol/L   Glucose, Bld 433 (H) 70 - 99 mg/dL   BUN 22 (H) 6 - 20 mg/dL   Creatinine, Ser 1.61 (H) 0.61 - 1.24 mg/dL   Calcium 8.9 8.9 - 09.6 mg/dL   GFR, Estimated >04 >54 mL/min   Anion gap 27 (H) 5 - 15  CBC  Result Value Ref Range   WBC 9.4 4.0 - 10.5 K/uL   RBC 4.98 4.22 - 5.81 MIL/uL   Hemoglobin 16.9 13.0 - 17.0 g/dL   HCT 09.8 11.9 - 14.7 %   MCV 98.6 80.0 - 100.0 fL   MCH 33.9 26.0 - 34.0 pg   MCHC 34.4 30.0 - 36.0 g/dL   RDW 82.9 (L) 56.2 - 13.0 %   Platelets 307 150 - 400 K/uL   nRBC 0.0 0.0 - 0.2 %  Urinalysis, Complete w Microscopic  Result Value Ref Range   Color, Urine STRAW (A) YELLOW   APPearance CLEAR (A) CLEAR   Specific Gravity, Urine 1.024 1.005 - 1.030   pH 5.0 5.0 - 8.0   Glucose, UA >=500 (A) NEGATIVE mg/dL   Hgb urine dipstick NEGATIVE NEGATIVE   Bilirubin Urine NEGATIVE NEGATIVE   Ketones, ur 80 (A) NEGATIVE mg/dL   Protein, ur NEGATIVE NEGATIVE mg/dL   Nitrite NEGATIVE NEGATIVE   Leukocytes,Ua NEGATIVE NEGATIVE   RBC / HPF 0-5 0 - 5 RBC/hpf   WBC, UA 0-5 0 - 5 WBC/hpf   Bacteria, UA NONE SEEN NONE SEEN   Squamous Epithelial / LPF NONE SEEN 0 - 5   Mucus PRESENT   Blood gas, venous  Result Value Ref Range   pH, Ven 7.11 (LL) 7.250 - 7.430   pCO2, Ven 23 (L) 44.0 - 60.0 mmHg   pO2, Ven PENDING 32.0 - 45.0 mmHg   Bicarbonate 7.3 (L) 20.0 - 28.0 mmol/L   Acid-base deficit 20.5 (H) 0.0 - 2.0 mmol/L   O2 Saturation 36.5 %   Patient temperature 37.0    Collection site VENOUS    Sample type VENOUS    Mechanical Rate VENOUS   Beta-hydroxybutyric acid  Result Value Ref Range   Beta-Hydroxybutyric Acid >8.00 (H) 0.05 - 0.27 mmol/L  Basic metabolic panel  Result Value Ref Range   Sodium 134 (L) 135 - 145 mmol/L   Potassium 4.1 3.5 - 5.1 mmol/L   Chloride 105 98 - 111 mmol/L   CO2 16 (L) 22 - 32 mmol/L    Glucose, Bld 166 (H) 70 - 99 mg/dL   BUN 16 6 - 20 mg/dL   Creatinine, Ser 8.65 0.61 - 1.24 mg/dL   Calcium 8.0 (L) 8.9 - 10.3 mg/dL   GFR, Estimated >78 >46 mL/min   Anion gap 13 5 - 15  Urine Drug Screen, Qualitative ([facility] only)  Result Value Ref Range   Tricyclic, Ur Screen NONE DETECTED NONE DETECTED   Amphetamines, Ur Screen  POSITIVE (A) NONE DETECTED   MDMA (Ecstasy)Ur Screen NONE DETECTED NONE DETECTED   Cocaine Metabolite,Ur [place] POSITIVE (A) NONE DETECTED   Opiate, Ur Screen NONE DETECTED NONE DETECTED   Phencyclidine (PCP) Ur S NONE DETECTED NONE DETECTED   Cannabinoid 50 Ng, Ur [place] NONE DETECTED NONE DETECTED   Barbiturates, Ur Screen NONE DETECTED NONE DETECTED   Benzodiazepine, Ur Scrn NONE DETECTED NONE DETECTED   Methadone Scn, Ur NONE DETECTED NONE DETECTED  HIV Antibody (routine testing w rflx)  Result Value Ref Range   HIV Screen 4th Generation wRfx Non Reactive Non Reactive  Basic metabolic panel  Result Value Ref Range   Sodium 134 (L) 135 - 145 mmol/L   Potassium 3.9 3.5 - 5.1 mmol/L   Chloride 106 98 - 111 mmol/L   CO2 15 (L) 22 - 32 mmol/L   Glucose, Bld 160 (H) 70 - 99 mg/dL   BUN 15 6 - 20 mg/dL   Creatinine, Ser 0.08 0.61 - 1.24 mg/dL   Calcium 8.0 (L) 8.9 - 10.3 mg/dL   GFR, Estimated >67 >61 mL/min   Anion gap 13 5 - 15  Basic metabolic panel  Result Value Ref Range   Sodium 134 (L) 135 - 145 mmol/L   Potassium 3.7 3.5 - 5.1 mmol/L   Chloride 106 98 - 111 mmol/L   CO2 18 (L) 22 - 32 mmol/L   Glucose, Bld 140 (H) 70 - 99 mg/dL   BUN 15 6 - 20 mg/dL   Creatinine, Ser 9.50 0.61 - 1.24 mg/dL   Calcium 8.0 (L) 8.9 - 10.3 mg/dL   GFR, Estimated >93 >26 mL/min   Anion gap 10 5 - 15  Basic metabolic panel  Result Value Ref Range   Sodium 137 135 - 145 mmol/L   Potassium 3.7 3.5 - 5.1 mmol/L   Chloride 108 98 - 111 mmol/L   CO2 21 (L) 22 - 32 mmol/L   Glucose, Bld 137 (H) 70 - 99 mg/dL   BUN 13 6 - 20 mg/dL   Creatinine, Ser 7.12 (L) 0.61 - 1.24  mg/dL   Calcium 8.6 (L) 8.9 - 10.3 mg/dL   GFR, Estimated >45 >80 mL/min   Anion gap 8 5 - 15  Basic metabolic panel  Result Value Ref Range   Sodium 137 135 - 145 mmol/L   Potassium 3.4 (L) 3.5 - 5.1 mmol/L   Chloride 108 98 - 111 mmol/L   CO2 24 22 - 32 mmol/L   Glucose, Bld 168 (H) 70 - 99 mg/dL   BUN 14 6 - 20 mg/dL   Creatinine, Ser 9.98 (L) 0.61 - 1.24 mg/dL   Calcium 8.4 (L) 8.9 - 10.3 mg/dL   GFR, Estimated >33 >82 mL/min   Anion gap 5 5 - 15  Glucose, capillary  Result Value Ref Range   Glucose-Capillary 279 (H) 70 - 99 mg/dL   Comment 1 Notify RN   Glucose, capillary  Result Value Ref Range   Glucose-Capillary 207 (H) 70 - 99 mg/dL  CBG monitoring, ED  Result Value Ref Range   Glucose-Capillary 427 (H) 70 - 99 mg/dL  CBG monitoring, ED  Result Value Ref Range   Glucose-Capillary 377 (H) 70 - 99 mg/dL  CBG monitoring, ED  Result Value Ref Range   Glucose-Capillary 221 (H) 70 - 99 mg/dL  CBG monitoring, ED  Result Value Ref Range   Glucose-Capillary 162 (H) 70 - 99 mg/dL  CBG monitoring, ED  Result Value Ref  Range   Glucose-Capillary 150 (H) 70 - 99 mg/dL  CBG monitoring, ED  Result Value Ref Range   Glucose-Capillary 157 (H) 70 - 99 mg/dL   Comment 1 Notify RN    Comment 2 Document in Chart   CBG monitoring, ED  Result Value Ref Range   Glucose-Capillary 140 (H) 70 - 99 mg/dL  CBG monitoring, ED  Result Value Ref Range   Glucose-Capillary 136 (H) 70 - 99 mg/dL  CBG monitoring, ED  Result Value Ref Range   Glucose-Capillary 164 (H) 70 - 99 mg/dL  CBG monitoring, ED  Result Value Ref Range   Glucose-Capillary 142 (H) 70 - 99 mg/dL  CBG monitoring, ED  Result Value Ref Range   Glucose-Capillary 119 (H) 70 - 99 mg/dL   I personally reviewed labs  Imaging: Results for orders placed during the hospital encounter of 02/14/21  MR BRAIN W WO CONTRAST  Narrative CLINICAL DATA:  Brain mass or lesion.  Seizure-like activity.  EXAM: MRI HEAD WITHOUT  AND WITH CONTRAST  TECHNIQUE: Multiplanar, multiecho pulse sequences of the brain and surrounding structures were obtained without and with intravenous contrast.  CONTRAST:  7mL GADAVIST GADOBUTROL 1 MMOL/ML IV SOLN  COMPARISON:  None.  FINDINGS: Brain: No acute infarct, mass effect or extra-axial collection. There is a hyperintense T2-weighted signal lesion at the left frontoparietal junction with associated magnetic susceptibility effects, measuring 1.6 x 1.2 cm. There is no surrounding edema. There is minimal associated enhancement. Normal white matter signal, parenchymal volume and CSF spaces. The midline structures are normal.  Vascular: Major flow voids are preserved.  Skull and upper cervical spine: Normal calvarium and skull base. Visualized upper cervical spine and soft tissues are normal.  Sinuses/Orbits:No paranasal sinus fluid levels or advanced mucosal thickening. No mastoid or middle ear effusion. Normal orbits.  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 No surrounding edema.   Electronically Signed By: Deatra RobinsonKevin  Herman M.D. On: 02/14/2021 23:37  I personally reviewed radiology studies to include:   Impression/Plan:  Mr. Eliott NineDunham is here for what appears to be a first-time seizure.  We did discuss that the MRI does show a small less than 2 cm likely cavernoma.  We did go over the natural history of this and this is a benign lesion.  This can be an etiology for seizures but we also did talk about the fact that the diabetic ketoacidosis could also lead to this.  Given the seizure, I do think neurology follow-up is needed and he may need a medication to help prevent further seizures.  We did go over that surgery for the cavernoma at this time given no recent hemorrhage concerns likely carries more risk than benefit.  We did talk about that surgery for removal of this can be done to help with seizure control  but given this has been a one-time seizure, I would not pursue that at this time.  I did discuss the need for further surveillance and I would like to see him in clinic for follow-up.   1.  Diagnosis: Cerebral cavernoma  2.  Plan -Follow-up in clinic in 2 to 3 months

## 2021-02-15 NOTE — Progress Notes (Signed)
Patient A & O x4 and able to make needs known. AVS reviewed with patient who verbalized understanding via teach back re medications, follow up appointments, signs and symptoms to notify MD as well as limitations and restrictions. Levetiracetam was called into [id number] and patient aware to pick up. Patient will transport home via private vehicle.

## 2021-04-25 LAB — BLOOD GAS, VENOUS
Acid-base deficit: 20.5 mmol/L — ABNORMAL HIGH (ref 0.0–2.0)
Bicarbonate: 7.3 mmol/L — ABNORMAL LOW (ref 20.0–28.0)
O2 Saturation: 36.5 %
Patient temperature: 37
pCO2, Ven: 23 mmHg — ABNORMAL LOW (ref 44.0–60.0)
pH, Ven: 7.11 — CL (ref 7.250–7.430)

## 2021-07-03 ENCOUNTER — Other Ambulatory Visit: Payer: Self-pay

## 2021-07-03 ENCOUNTER — Emergency Department: Payer: 59

## 2021-07-03 ENCOUNTER — Inpatient Hospital Stay
Admission: EM | Admit: 2021-07-03 | Discharge: 2021-07-05 | DRG: 638 | Disposition: A | Discharge status: Home / Self Care | Payer: 59 | Attending: Internal Medicine | Admitting: Internal Medicine

## 2021-07-03 DIAGNOSIS — F1910 Other psychoactive substance abuse, uncomplicated: Secondary | ICD-10-CM | POA: Diagnosis present

## 2021-07-03 DIAGNOSIS — E1010 Type 1 diabetes mellitus with ketoacidosis without coma: Principal | ICD-10-CM | POA: Diagnosis present

## 2021-07-03 DIAGNOSIS — F17290 Nicotine dependence, other tobacco product, uncomplicated: Secondary | ICD-10-CM | POA: Diagnosis present

## 2021-07-03 DIAGNOSIS — R7989 Other specified abnormal findings of blood chemistry: Secondary | ICD-10-CM | POA: Diagnosis present

## 2021-07-03 DIAGNOSIS — Z794 Long term (current) use of insulin: Secondary | ICD-10-CM | POA: Diagnosis not present

## 2021-07-03 DIAGNOSIS — Z9114 Patient's other noncompliance with medication regimen: Secondary | ICD-10-CM

## 2021-07-03 DIAGNOSIS — E871 Hypo-osmolality and hyponatremia: Secondary | ICD-10-CM | POA: Diagnosis present

## 2021-07-03 DIAGNOSIS — Z79899 Other long term (current) drug therapy: Secondary | ICD-10-CM | POA: Diagnosis not present

## 2021-07-03 DIAGNOSIS — K900 Celiac disease: Secondary | ICD-10-CM | POA: Diagnosis present

## 2021-07-03 DIAGNOSIS — G40909 Epilepsy, unspecified, not intractable, without status epilepticus: Secondary | ICD-10-CM | POA: Diagnosis present

## 2021-07-03 DIAGNOSIS — F1010 Alcohol abuse, uncomplicated: Secondary | ICD-10-CM | POA: Diagnosis present

## 2021-07-03 DIAGNOSIS — F1510 Other stimulant abuse, uncomplicated: Secondary | ICD-10-CM | POA: Diagnosis present

## 2021-07-03 DIAGNOSIS — F1410 Cocaine abuse, uncomplicated: Secondary | ICD-10-CM | POA: Diagnosis present

## 2021-07-03 DIAGNOSIS — N179 Acute kidney failure, unspecified: Secondary | ICD-10-CM

## 2021-07-03 DIAGNOSIS — E1065 Type 1 diabetes mellitus with hyperglycemia: Secondary | ICD-10-CM | POA: Diagnosis present

## 2021-07-03 DIAGNOSIS — Z20822 Contact with and (suspected) exposure to covid-19: Secondary | ICD-10-CM | POA: Diagnosis present

## 2021-07-03 DIAGNOSIS — E875 Hyperkalemia: Secondary | ICD-10-CM | POA: Diagnosis present

## 2021-07-03 DIAGNOSIS — E1165 Type 2 diabetes mellitus with hyperglycemia: Secondary | ICD-10-CM | POA: Diagnosis not present

## 2021-07-03 DIAGNOSIS — R059 Cough, unspecified: Secondary | ICD-10-CM

## 2021-07-03 LAB — BASIC METABOLIC PANEL
Anion gap: 18 — ABNORMAL HIGH (ref 5–15)
Anion gap: 15 (ref 5–15)
BUN: 22 mg/dL — ABNORMAL HIGH (ref 6–20)
BUN: 16 mg/dL (ref 6–20)
BUN: 14 mg/dL (ref 6–20)
CO2: <7 mmol/L — ABNORMAL LOW (ref 22–32)
CO2: 8 mmol/L — ABNORMAL LOW (ref 22–32)
CO2: 12 mmol/L — ABNORMAL LOW (ref 22–32)
Calcium: 9 mg/dL (ref 8.9–10.3)
Calcium: 8.2 mg/dL — ABNORMAL LOW (ref 8.9–10.3)
Calcium: 8.2 mg/dL — ABNORMAL LOW (ref 8.9–10.3)
Chloride: 107 mmol/L (ref 98–111)
Chloride: 110 mmol/L (ref 98–111)
Chloride: 109 mmol/L (ref 98–111)
Creatinine, Ser: 1.1 mg/dL (ref 0.61–1.24)
Creatinine, Ser: 0.84 mg/dL (ref 0.61–1.24)
Creatinine, Ser: 0.76 mg/dL (ref 0.61–1.24)
GFR, Estimated: >60 mL/min (ref >60)
GFR, Estimated: >60 mL/min (ref >60)
GFR, Estimated: >60 mL/min (ref >60)
Glucose, Bld: 364 mg/dL — ABNORMAL HIGH (ref 70–99)
Glucose, Bld: 179 mg/dL — ABNORMAL HIGH (ref 70–99)
Glucose, Bld: 142 mg/dL — ABNORMAL HIGH (ref 70–99)
Potassium: 5 mmol/L (ref 3.5–5.1)
Potassium: 4.3 mmol/L (ref 3.5–5.1)
Potassium: 4 mmol/L (ref 3.5–5.1)
Sodium: 139 mmol/L (ref 135–145)
Sodium: 136 mmol/L (ref 135–145)
Sodium: 136 mmol/L (ref 135–145)

## 2021-07-03 LAB — COMPREHENSIVE METABOLIC PANEL
ALT: 81 U/L — ABNORMAL HIGH (ref 0–44)
AST: 76 U/L — ABNORMAL HIGH (ref 15–41)
Albumin: 5 g/dL (ref 3.5–5.0)
Alkaline Phosphatase: 175 U/L — ABNORMAL HIGH (ref 38–126)
BUN: 27 mg/dL — ABNORMAL HIGH (ref 6–20)
CO2: <7 mmol/L — ABNORMAL LOW (ref 22–32)
Calcium: 9.5 mg/dL (ref 8.9–10.3)
Chloride: 94 mmol/L — ABNORMAL LOW (ref 98–111)
Creatinine, Ser: 1.4 mg/dL — ABNORMAL HIGH (ref 0.61–1.24)
GFR, Estimated: >60 mL/min (ref >60)
Glucose, Bld: 662 mg/dL (ref 70–99)
Potassium: 5.9 mmol/L — ABNORMAL HIGH (ref 3.5–5.1)
Sodium: 131 mmol/L — ABNORMAL LOW (ref 135–145)
Total Bilirubin: 3.1 mg/dL — ABNORMAL HIGH (ref 0.3–1.2)
Total Protein: 9.3 g/dL — ABNORMAL HIGH (ref 6.5–8.1)

## 2021-07-03 LAB — URINE DRUG SCREEN, QUALITATIVE (ARMC ONLY)
Amphetamines, Ur Screen: NOT DETECTED
Barbiturates, Ur Screen: NOT DETECTED
Benzodiazepine, Ur Scrn: NOT DETECTED
Cannabinoid 50 Ng, Ur ~~LOC~~: NOT DETECTED
Cocaine Metabolite,Ur ~~LOC~~: NOT DETECTED
MDMA (Ecstasy)Ur Screen: NOT DETECTED
Methadone Scn, Ur: NOT DETECTED
Opiate, Ur Screen: NOT DETECTED
Phencyclidine (PCP) Ur S: NOT DETECTED
Tricyclic, Ur Screen: NOT DETECTED

## 2021-07-03 LAB — GLUCOSE, CAPILLARY
Glucose-Capillary: 468 mg/dL — ABNORMAL HIGH (ref 70–99)
Glucose-Capillary: 298 mg/dL — ABNORMAL HIGH (ref 70–99)
Glucose-Capillary: 176 mg/dL — ABNORMAL HIGH (ref 70–99)
Glucose-Capillary: 232 mg/dL — ABNORMAL HIGH (ref 70–99)
Glucose-Capillary: 181 mg/dL — ABNORMAL HIGH (ref 70–99)
Glucose-Capillary: 129 mg/dL — ABNORMAL HIGH (ref 70–99)
Glucose-Capillary: 156 mg/dL — ABNORMAL HIGH (ref 70–99)
Glucose-Capillary: 142 mg/dL — ABNORMAL HIGH (ref 70–99)
Glucose-Capillary: 146 mg/dL — ABNORMAL HIGH (ref 70–99)
Glucose-Capillary: 140 mg/dL — ABNORMAL HIGH (ref 70–99)
Glucose-Capillary: 141 mg/dL — ABNORMAL HIGH (ref 70–99)
Glucose-Capillary: 129 mg/dL — ABNORMAL HIGH (ref 70–99)
Glucose-Capillary: 88 mg/dL (ref 70–99)
Glucose-Capillary: 241 mg/dL — ABNORMAL HIGH (ref 70–99)

## 2021-07-03 LAB — URINALYSIS, COMPLETE (UACMP) WITH MICROSCOPIC
Bacteria, UA: NONE SEEN
Bilirubin Urine: NEGATIVE
Glucose, UA: >=500 mg/dL — AB
Ketones, ur: 80 mg/dL — AB
Leukocytes,Ua: NEGATIVE
Nitrite: NEGATIVE
Protein, ur: 30 mg/dL — AB
Specific Gravity, Urine: 1.021 (ref 1.005–1.030)
pH: 5 (ref 5.0–8.0)

## 2021-07-03 LAB — CBC WITH DIFFERENTIAL/PLATELET
Abs Immature Granulocytes: 0.58 10*3/uL — ABNORMAL HIGH (ref 0.00–0.07)
Basophils Absolute: 0.2 10*3/uL — ABNORMAL HIGH (ref 0.0–0.1)
Basophils Relative: 1 %
Eosinophils Absolute: 0.1 10*3/uL (ref 0.0–0.5)
Eosinophils Relative: 0 %
HCT: 52.5 % — ABNORMAL HIGH (ref 39.0–52.0)
Hemoglobin: 17.8 g/dL — ABNORMAL HIGH (ref 13.0–17.0)
Immature Granulocytes: 4 %
Lymphocytes Relative: 14 %
Lymphs Abs: 2.4 10*3/uL (ref 0.7–4.0)
MCH: 34.8 pg — ABNORMAL HIGH (ref 26.0–34.0)
MCHC: 33.9 g/dL (ref 30.0–36.0)
MCV: 102.7 fL — ABNORMAL HIGH (ref 80.0–100.0)
Monocytes Absolute: 0.6 10*3/uL (ref 0.1–1.0)
Monocytes Relative: 4 %
Neutro Abs: 12.8 10*3/uL — ABNORMAL HIGH (ref 1.7–7.7)
Neutrophils Relative %: 77 %
Platelets: 451 10*3/uL — ABNORMAL HIGH (ref 150–400)
RBC: 5.11 MIL/uL (ref 4.22–5.81)
RDW: 11.8 % (ref 11.5–15.5)
WBC: 16.6 10*3/uL — ABNORMAL HIGH (ref 4.0–10.5)
nRBC: 0 % (ref 0.0–0.2)

## 2021-07-03 LAB — PROCALCITONIN: Procalcitonin: 0.69 ng/mL

## 2021-07-03 LAB — CBC
HCT: 46.7 % (ref 39.0–52.0)
Hemoglobin: 16.1 g/dL (ref 13.0–17.0)
MCH: 34.8 pg — ABNORMAL HIGH (ref 26.0–34.0)
MCHC: 34.5 g/dL (ref 30.0–36.0)
MCV: 100.9 fL — ABNORMAL HIGH (ref 80.0–100.0)
Platelets: 338 10*3/uL (ref 150–400)
RBC: 4.63 MIL/uL (ref 4.22–5.81)
RDW: 11.8 % (ref 11.5–15.5)
WBC: 16.9 10*3/uL — ABNORMAL HIGH (ref 4.0–10.5)
nRBC: 0 % (ref 0.0–0.2)

## 2021-07-03 LAB — PHOSPHORUS: Phosphorus: 5.6 mg/dL — ABNORMAL HIGH (ref 2.5–4.6)

## 2021-07-03 LAB — RESP PANEL BY RT-PCR (FLU A&B, COVID) ARPGX2
Influenza A by PCR: NEGATIVE
Influenza B by PCR: NEGATIVE
SARS Coronavirus 2 by RT PCR: NEGATIVE

## 2021-07-03 LAB — LACTIC ACID, PLASMA
Lactic Acid, Venous: 2.9 mmol/L (ref 0.5–1.9)
Lactic Acid, Venous: 3.1 mmol/L (ref 0.5–1.9)
Lactic Acid, Venous: 1.3 mmol/L (ref 0.5–1.9)

## 2021-07-03 LAB — BLOOD GAS, VENOUS
Acid-base deficit: 25.9 mmol/L — ABNORMAL HIGH (ref 0.0–2.0)
Bicarbonate: 6.2 mmol/L — ABNORMAL LOW (ref 20.0–28.0)
O2 Saturation: 32.1 %
Patient temperature: 37
pCO2, Ven: 30 mmHg — ABNORMAL LOW (ref 44.0–60.0)
pH, Ven: 6.92 — CL (ref 7.250–7.430)
pO2, Ven: 36 mmHg (ref 32.0–45.0)

## 2021-07-03 LAB — BETA-HYDROXYBUTYRIC ACID
Beta-Hydroxybutyric Acid: >8 mmol/L — ABNORMAL HIGH (ref 0.05–0.27)
Beta-Hydroxybutyric Acid: >8 mmol/L — ABNORMAL HIGH (ref 0.05–0.27)
Beta-Hydroxybutyric Acid: 5.33 mmol/L — ABNORMAL HIGH (ref 0.05–0.27)

## 2021-07-03 LAB — CBG MONITORING, ED
Glucose-Capillary: >600 mg/dL (ref 70–99)
Glucose-Capillary: 501 mg/dL (ref 70–99)

## 2021-07-03 LAB — MRSA NEXT GEN BY PCR, NASAL: MRSA by PCR Next Gen: NOT DETECTED

## 2021-07-03 LAB — LIPASE, BLOOD: Lipase: 76 U/L — ABNORMAL HIGH (ref 11–51)

## 2021-07-03 LAB — MAGNESIUM: Magnesium: 2.4 mg/dL (ref 1.7–2.4)

## 2021-07-03 IMAGING — DX DG CHEST 1V
1 series · 1 of 1 positions shown · non-contrast
Comparison: [DATE]

CLINICAL DATA: Hyperglycemia

EXAM:
CHEST  1 VIEW

[chest ap]
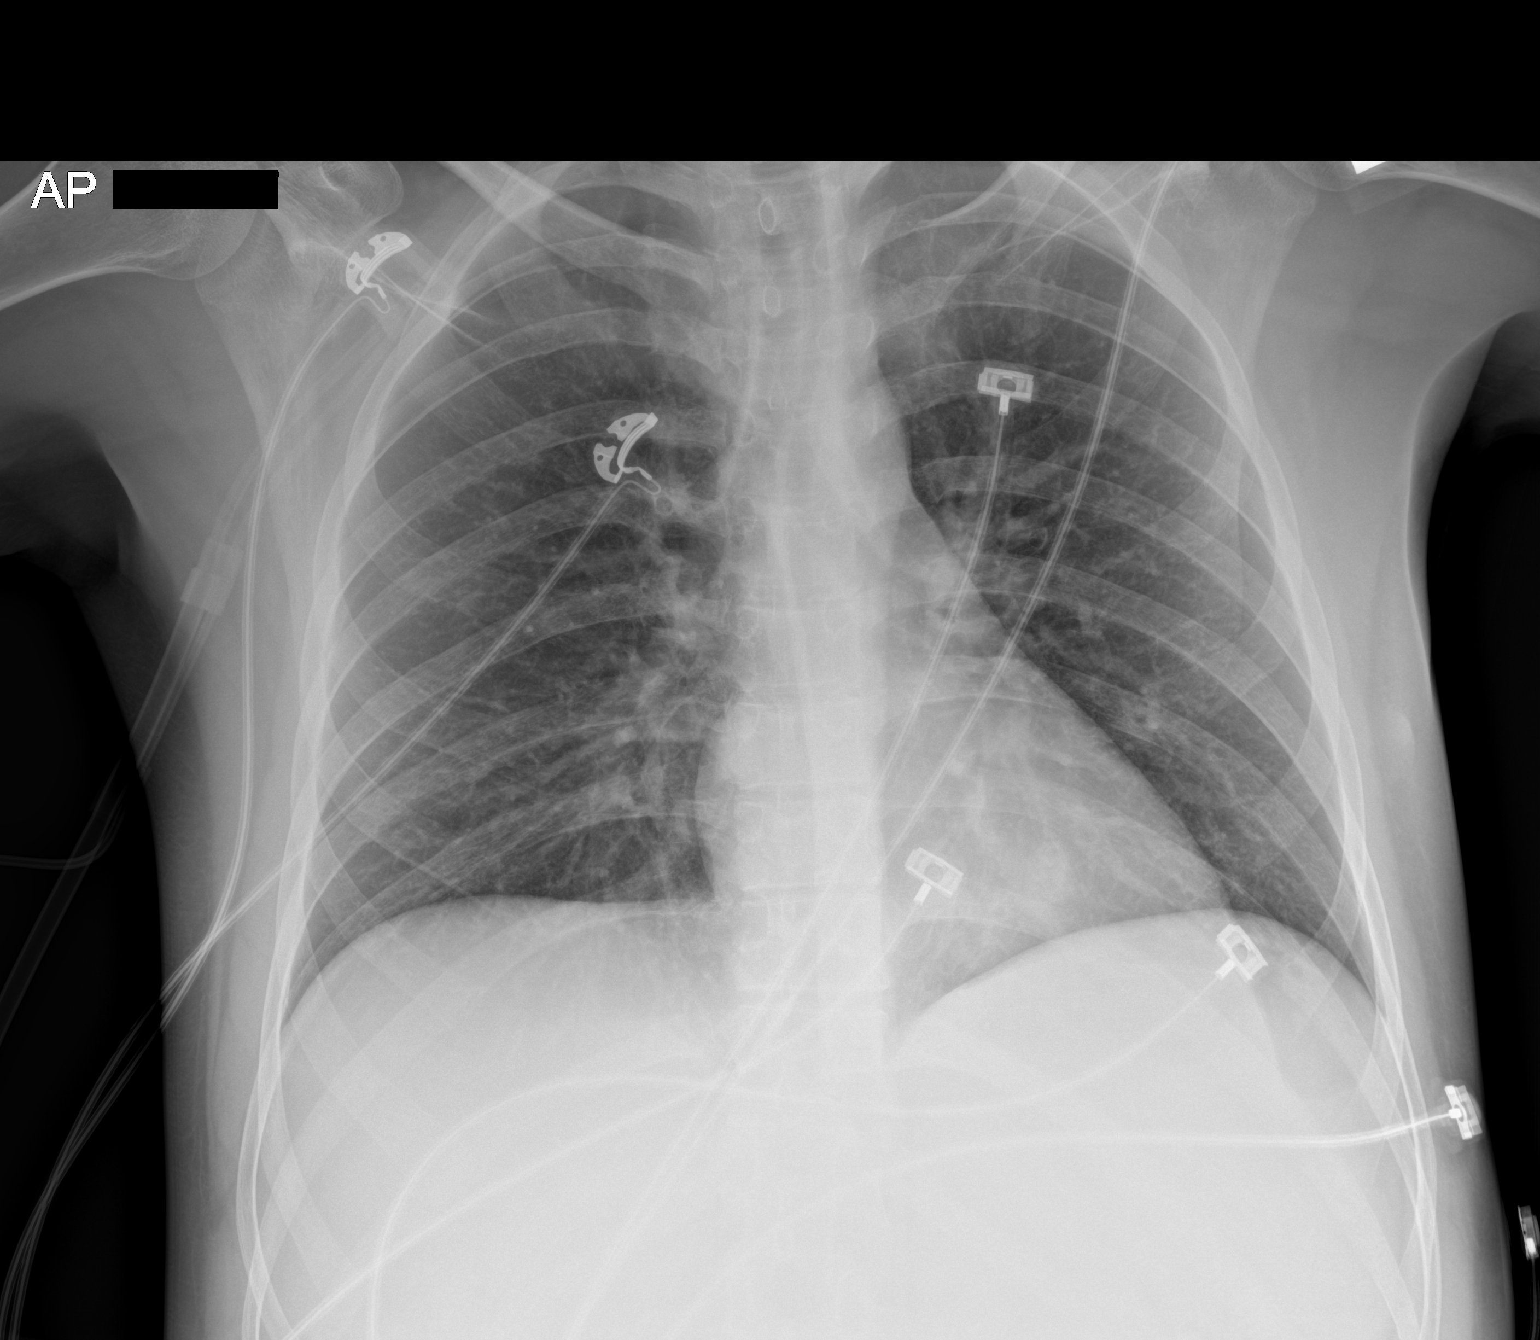

[1 of 1 positions shown; findings below may reference images not displayed]

FINDINGS: Normal heart size and mediastinal contours. No acute infiltrate or
edema. No effusion or pneumothorax. No acute osseous findings.
Artifact from EKG leads
IMPRESSION: No evidence of active disease.

## 2021-07-03 MED ORDER — LACTATED RINGERS IV BOLUS
1000.0000 mL | Freq: Once | INTRAVENOUS | Status: AC
  Administered 2021-07-03: 05:00:00 1000 mL via INTRAVENOUS

## 2021-07-03 MED ORDER — DEXTROSE IN LACTATED RINGERS 5 % IV SOLN: INTRAVENOUS | Status: DC

## 2021-07-03 MED ORDER — INSULIN REGULAR(HUMAN) IN NACL 100-0.9 UT/100ML-% IV SOLN
INTRAVENOUS | Status: DC
  Administered 2021-07-03: 05:00:00 9.5 [IU]/h via INTRAVENOUS
  Filled 2021-07-03: qty 100

## 2021-07-03 MED ORDER — LEVETIRACETAM IN NACL 500 MG/100ML IV SOLN
500.0000 mg | Freq: Two times a day (BID) | INTRAVENOUS | Status: DC
  Administered 2021-07-03 – 2021-07-04 (×3): 500 mg via INTRAVENOUS
  Filled 2021-07-03 (×5): qty 100

## 2021-07-03 MED ORDER — LORAZEPAM 2 MG/ML IJ SOLN
1.0000 mg | Freq: Once | INTRAMUSCULAR | Status: AC
  Administered 2021-07-03: 09:00:00 1 mg via INTRAVENOUS
  Filled 2021-07-03: qty 1

## 2021-07-03 MED ORDER — INSULIN GLARGINE-YFGN 100 UNIT/ML ~~LOC~~ SOLN
40.0000 [IU] | Freq: Every day | SUBCUTANEOUS | Status: DC
  Administered 2021-07-03 – 2021-07-05 (×3): 40 [IU] via SUBCUTANEOUS
  Filled 2021-07-03 (×4): qty 0.4

## 2021-07-03 MED ORDER — LACTATED RINGERS IV SOLN: INTRAVENOUS | Status: DC

## 2021-07-03 MED ORDER — GUAIFENESIN 100 MG/5ML PO LIQD
5.0000 mL | ORAL | Status: DC | PRN
  Administered 2021-07-03 – 2021-07-04 (×2): 5 mL via ORAL
  Filled 2021-07-03 (×3): qty 5

## 2021-07-03 MED ORDER — LACTATED RINGERS IV BOLUS
20.0000 mL/kg | Freq: Once | INTRAVENOUS | Status: AC
  Administered 2021-07-03: 05:00:00 1000 mL via INTRAVENOUS

## 2021-07-03 MED ORDER — INSULIN ASPART 100 UNIT/ML IJ SOLN
0.0000 [IU] | INTRAMUSCULAR | Status: DC
Start: 2021-07-03 — End: 2021-07-04
  Administered 2021-07-04: 04:00:00 1 [IU] via SUBCUTANEOUS
  Administered 2021-07-04: 3 [IU] via SUBCUTANEOUS
  Administered 2021-07-04: 08:00:00 2 [IU] via SUBCUTANEOUS
  Filled 2021-07-03 (×3): qty 1

## 2021-07-03 MED ORDER — SODIUM BICARBONATE 8.4 % IV SOLN
50.0000 meq | Freq: Once | INTRAVENOUS | Status: AC
  Administered 2021-07-03: 04:00:00 50 meq via INTRAVENOUS

## 2021-07-03 MED ORDER — DOCUSATE SODIUM 100 MG PO CAPS: 100.0000 mg | ORAL_CAPSULE | Freq: Two times a day (BID) | ORAL | Status: DC | PRN

## 2021-07-03 MED ORDER — POLYETHYLENE GLYCOL 3350 17 G PO PACK: 17.0000 g | PACK | Freq: Every day | ORAL | Status: DC | PRN

## 2021-07-03 MED ORDER — LACTATED RINGERS IV BOLUS
1000.0000 mL | Freq: Once | INTRAVENOUS | Status: AC
  Administered 2021-07-03: 08:00:00 1000 mL via INTRAVENOUS

## 2021-07-03 MED ORDER — POTASSIUM CHLORIDE 10 MEQ/100ML IV SOLN
10.0000 meq | INTRAVENOUS | Status: DC
  Administered 2021-07-03 (×2): 10 meq via INTRAVENOUS
  Filled 2021-07-03 (×2): qty 100

## 2021-07-03 MED ORDER — ONDANSETRON HCL 4 MG/2ML IJ SOLN
4.0000 mg | Freq: Once | INTRAMUSCULAR | Status: AC
  Administered 2021-07-03: 04:00:00 4 mg via INTRAVENOUS
  Filled 2021-07-03: qty 2

## 2021-07-03 MED ORDER — CHLORHEXIDINE GLUCONATE CLOTH 2 % EX PADS: 6.0000 | MEDICATED_PAD | Freq: Every day | CUTANEOUS | Status: DC

## 2021-07-03 MED ORDER — DEXTROSE 50 % IV SOLN: 0.0000 mL | INTRAVENOUS | Status: DC | PRN

## 2021-07-03 MED ORDER — LACTATED RINGERS IV BOLUS
2000.0000 mL | Freq: Once | INTRAVENOUS | Status: AC
  Administered 2021-07-03: 04:00:00 2000 mL via INTRAVENOUS

## 2021-07-03 NOTE — ED Triage Notes (Signed)
Pt presents to ER via ems from home with hyperglycemia. Pt has hx T1 diabetes and is non-compliant with his meds.  Pt had CBG of 322 with ems.  Pt tachypneic on arrival, with tachycardia.  Pt A&O x4, and asking for ice chips.

## 2021-07-03 NOTE — Progress Notes (Addendum)
0800 Patient very rude, hateful, and demanding. Explained plan of care to patient. Patient refused to be quiet and listen. Ask for charge nurse. Charge nurse re explained plan of care, DKA protocol, and why he was here. Patient requested different nurse. Charge nurse refused.Patient upset when questioned about living arrangements, Insulin use etc. Patient also very hyperactive and demanding. Dr. Karna Christmas in to see patient.  He re- explained DKA protocol and why DKA is dangerous to patients body. Given small dose of Ativan for hyperactivity. Ativan given and patient slept all morning and afternoon until mother arrived at 1500.Ovious friction between mom and patient. 1600. Patient ask mom to leave when girlfriend arrived.1630 Mom left. Discussed with patient his relationship issues with mother. Patient has valid issues but he is also very immature for his age. He still lives with mom but expects her to tolerate his non compliant behavior. He states he doesn't care if he dies early due to his diabetes.His father has never been in his life per his girlfriend and he dislikes his step father. He converses with step brother who also appears to dislike his mother. Patient also states he is an ex  drug addict but clings to his book bag.Will ask Dr Karna Christmas for a psych eval.

## 2021-07-03 NOTE — Progress Notes (Signed)
Inpatient Diabetes Program Recommendations  AACE/ADA: New Consensus Statement on Inpatient Glycemic Control (2015)  Target Ranges:  Prepandial:   less than 140 mg/dL      Peak postprandial:   less than 180 mg/dL (1-2 hours)      Critically ill patients:  140 - 180 mg/dL   Lab Results  Component Value Date   GLUCAP 140 (H) 07/03/2021   HGBA1C 10.4 (H) 02/15/2021    Review of Glycemic Control  Latest Reference Range & Units 07/03/21 13:13  Sodium 135 - 145 mmol/L 136  Potassium 3.5 - 5.1 mmol/L 4.0  Chloride 98 - 111 mmol/L 109  CO2 22 - 32 mmol/L 12 (L)  Glucose 70 - 99 mg/dL 852 (H)  BUN 6 - 20 mg/dL 14  Creatinine 7.78 - 2.42 mg/dL 3.53  Calcium 8.9 - 61.4 mg/dL 8.2 (L)  Anion gap 5 - 15  15    Latest Reference Range & Units 07/03/21 11:29 07/03/21 12:34 07/03/21 13:26 07/03/21 14:28  Glucose-Capillary 70 - 99 mg/dL 431 (H) 540 (H) 086 (H) 140 (H)  Diabetes history: DM 1 Outpatient Diabetes medications:  Lantus 50 units q HS, Humalog 0-50 units as directed Current orders for Inpatient glycemic control:  IV insulin/ Semglee 40 units daily  Inpatient Diabetes Program Recommendations:    Note patient received Lantus this afternoon, however it appears CO2 still =12.  May need to continue insulin drip until CO2 > 18.    Thanks,  Beryl Meager, RN, BC-ADM Inpatient Diabetes Coordinator Pager [phone]  (8a-5p)

## 2021-07-03 NOTE — Consult Note (Signed)
PHARMACY CONSULT NOTE - FOLLOW UP  Pharmacy Consult for Electrolyte Monitoring and Replacement   Recent Labs: Potassium (mmol/L)  Date Value  07/03/2021 5.0   Magnesium (mg/dL)  Date Value  26/94/8546 2.4   Calcium (mg/dL)  Date Value  27/09/5007 9.0   Albumin (g/dL)  Date Value  38/18/2993 5.0   Phosphorus (mg/dL)  Date Value  71/69/6789 5.6 (H)   Sodium (mmol/L)  Date Value  07/03/2021 139     Assessment: 20 y.o male  with significant PMH of type 1 diabetes mellitus, celiac disease, seizure disorder, polysubstance abuse (EtOH abuse, cocaine and amphetamine) who presented to the ED with hyperglycemia and concerns for DKA.  On LR @ 125 ml/hr and insulin gtt.   Goal of Therapy:  WNL  Plan:  No replacement at this time. Monitor potassium while on potassium.  F/u with AM labs.   Ronnald Ramp ,PharmD Clinical Pharmacist 07/03/2021 8:22 AM

## 2021-07-03 NOTE — Progress Notes (Signed)
eLink Physician-Brief Progress Note Patient Name: Edmon Magid DOB: [birth year] MRN: [id number]   Date of Service  07/03/2021  HPI/Events of Note  5M admitted w/ DKA  eICU Interventions  In incu - on insulin gtt - awake/alert     Intervention Category Evaluation Type: New Patient Evaluation  Jacinta Shoe 07/03/2021, 5:40 AM

## 2021-07-03 NOTE — Plan of Care (Signed)
Discussed with patient plan of care for the for the evening, pain management and signs of low blood sugar with some teach back displayed.   Problem: Education: Goal: Knowledge of General Education information will improve Description: Including pain rating scale, medication(s)/side effects and non-pharmacologic comfort measures Outcome: Progressing

## 2021-07-03 NOTE — ED Notes (Signed)
Critical blood gas called from RT, dr. Don Perking notified of values.

## 2021-07-03 NOTE — Consult Note (Signed)
PHARMACY CONSULT NOTE - FOLLOW UP  Pharmacy Consult for Electrolyte Monitoring and Replacement   Recent Labs: Potassium (mmol/L)  Date Value  07/03/2021 4.3   Magnesium (mg/dL)  Date Value  59/45/8592 2.4   Calcium (mg/dL)  Date Value  92/44/6286 8.2 (L)   Albumin (g/dL)  Date Value  38/17/7116 5.0   Phosphorus (mg/dL)  Date Value  57/90/3833 5.6 (H)   Sodium (mmol/L)  Date Value  07/03/2021 136     Assessment: 20 y.o male  with significant PMH of type 1 diabetes mellitus, celiac disease, seizure disorder, polysubstance abuse (EtOH abuse, cocaine and amphetamine) who presented to the ED with hyperglycemia and concerns for DKA.  On LR @ 125 ml/hr and insulin gtt.   Goal of Therapy:  WNL  Plan:  K+ 5 > 4.3 will give Kcl 10 mEq IV x 2.  Monitor potassium while on potassium.  F/u with AM labs.   Ronnald Ramp ,PharmD Clinical Pharmacist 07/03/2021 11:09 AM

## 2021-07-03 NOTE — ED Provider Notes (Signed)
[facility] Emergency Department Provider Note  ____________________________________________  Time seen: Approximately 4:07 AM  I have reviewed the triage vital signs and the nursing notes.   HISTORY  Chief Complaint Hyperglycemia   HPI Jesus Ewing is a 20 y.o. male with a history of type 1 diabetes, celiac disease, seizures, alcohol and cocaine abuse who presents for hyperglycemia and concerns of DKA.  Patient reports that he has been sick with cough, congestion, body aches, chills and fever for the last 2 days.  Today started to become confused and family was concerned for DKA.  Patient denies any cocaine since his last admission to the hospital in August.  Still drinking alcohol.  Reports 1 drink earlier today.  No chest pain, no abdominal pain.  Has had nausea and several episodes of nonbloody nonbilious emesis.  Patient reports that he has been compliant with his Lantus but occasionally misses some of his Humalog doses.  Past Medical History:  Diagnosis Date   Celiac disease    Diabetes mellitus without complication [facility])     Patient Active Problem List   Diagnosis Date Noted   Severe hyperglycemia due to diabetes mellitus (HCC) 07/03/2021   DKA (diabetic ketoacidosis) (HCC) 02/14/2021   Seizure (HCC) 02/14/2021   AKI (acute kidney injury) (HCC) 02/14/2021   Diabetes mellitus without complication (HCC) 02/14/2021   Overdose of trazodone 02/14/2021   Depression 02/14/2021   DKA (diabetic ketoacidoses) 05/26/2019   DKA, type 1 (HCC) 06/19/2016   Celiac disease 06/19/2016    Past Surgical History:  Procedure Laterality Date   NO PAST SURGERIES      Prior to Admission medications   Medication Sig Start Date End Date Taking? Authorizing Provider  insulin lispro (HUMALOG) 100 UNIT/ML KwikPen Inject 0-50 Units into the skin as directed. 07/24/18  Yes [provider]  LANTUS SOLOSTAR 100 UNIT/ML Solostar Pen Inject 50 Units into  the skin at bedtime. 05/04/19  Yes [provider]  levETIRAcetam (KEPPRA) 500 MG tablet Take 1 tablet (500 mg total) by mouth 2 (two) times daily. 02/15/21 07/03/21 Yes Tresa Moore, MD    Allergies Patient has no known allergies.  Family History  Family history unknown: Yes    Social History Social History   Tobacco Use   Smoking status: Never   Smokeless tobacco: Never  Vaping Use   Vaping Use: Every day  Substance Use Topics   Alcohol use: Yes   Drug use: Yes    Types: "Crack" cocaine    Review of Systems  Constitutional: + fever, confusion, chills Eyes: Negative for visual changes. ENT: Negative for sore throat. Neck: No neck pain  Cardiovascular: Negative for chest pain. Respiratory: Negative for shortness of breath. + cough Gastrointestinal: Negative for abdominal pain, or diarrhea. + N/V Genitourinary: Negative for dysuria. Musculoskeletal: Negative for back pain. Skin: Negative for rash. Neurological: Negative for headaches, weakness or numbness. Psych: No SI or HI  ____________________________________________   PHYSICAL EXAM:  VITAL SIGNS: ED Triage Vitals  Enc Vitals Group     BP 07/03/21 0354 (!) 142/87     Pulse Rate 07/03/21 0354 (!) 125     Resp 07/03/21 0354 (!) 32     Temp --      Temp src --      SpO2 07/03/21 0354 99 %     Weight --      Height --      Head Circumference --      Peak  Flow --      Pain Score 07/03/21 0357 7     Pain Loc --      Pain Edu? --      Excl. in GC? --     Constitutional: Alert and oriented to self and place, slightly confused, ill-appearing HEENT:      Head: Normocephalic and atraumatic.         Eyes: Conjunctivae are normal. Sclera is non-icteric.       Mouth/Throat: Mucous membranes are dry.       Neck: Supple with no signs of meningismus. Cardiovascular: Tachycardic with regular rhythm Respiratory: Tachypneic with Kussmaul respiration  gastrointestinal: Soft, non tender, and non  distended with positive bowel sounds. No rebound or guarding. Genitourinary: No CVA tenderness. Musculoskeletal:  No edema, cyanosis, or erythema of extremities. Neurologic: Normal speech and language. Face is symmetric. Moving all extremities. No gross focal neurologic deficits are appreciated. Skin: Skin is warm, dry and intact. No rash noted. Psychiatric: Mood and affect are normal. Speech and behavior are normal.  ____________________________________________   LABS (all labs ordered are listed, but only abnormal results are displayed)  Labs Reviewed  BLOOD GAS, VENOUS - Abnormal; Notable for the following components:      Result Value   pH, Ven 6.92 (*)    pCO2, Ven 30 (*)    Bicarbonate 6.2 (*)    Acid-base deficit 25.9 (*)    All other components within normal limits  COMPREHENSIVE METABOLIC PANEL - Abnormal; Notable for the following components:   Sodium 131 (*)    Potassium 5.9 (*)    Chloride 94 (*)    CO2 <7 (*)    Glucose, Bld 662 (*)    BUN 27 (*)    Creatinine, Ser 1.40 (*)    Total Protein 9.3 (*)    AST 76 (*)    ALT 81 (*)    Alkaline Phosphatase 175 (*)    Total Bilirubin 3.1 (*)    All other components within normal limits  CBC WITH DIFFERENTIAL/PLATELET - Abnormal; Notable for the following components:   WBC 16.6 (*)    Hemoglobin 17.8 (*)    HCT 52.5 (*)    MCV 102.7 (*)    MCH 34.8 (*)    Platelets 451 (*)    Neutro Abs 12.8 (*)    Basophils Absolute 0.2 (*)    Abs Immature Granulocytes 0.58 (*)    All other components within normal limits  LACTIC ACID, PLASMA - Abnormal; Notable for the following components:   Lactic Acid, Venous 2.9 (*)    All other components within normal limits  URINALYSIS, COMPLETE (UACMP) WITH MICROSCOPIC - Abnormal; Notable for the following components:   Color, Urine STRAW (*)    APPearance CLEAR (*)    Glucose, UA >=500 (*)    Hgb urine dipstick SMALL (*)    Ketones, ur 80 (*)    Protein, ur 30 (*)    All other  components within normal limits  CBG MONITORING, ED - Abnormal; Notable for the following components:   Glucose-Capillary >600 (*)    All other components within normal limits  CBG MONITORING, ED - Abnormal; Notable for the following components:   Glucose-Capillary 501 (*)    All other components within normal limits  RESP PANEL BY RT-PCR (FLU A&B, COVID) ARPGX2  CULTURE, BLOOD (ROUTINE X 2)  CULTURE, BLOOD (ROUTINE X 2)  URINE CULTURE  LACTIC ACID, PLASMA  BETA-HYDROXYBUTYRIC ACID  URINE DRUG SCREEN,  QUALITATIVE ([facility] ONLY)  LACTIC ACID, PLASMA  LACTIC ACID, PLASMA  PROCALCITONIN  LIPASE, BLOOD  CBC  MAGNESIUM  BLOOD GAS, ARTERIAL  PHOSPHORUS  BETA-HYDROXYBUTYRIC ACID  BETA-HYDROXYBUTYRIC ACID  BETA-HYDROXYBUTYRIC ACID  HEMOGLOBIN A1C  BASIC METABOLIC PANEL  BASIC METABOLIC PANEL  BASIC METABOLIC PANEL  BASIC METABOLIC PANEL  BASIC METABOLIC PANEL   ____________________________________________  EKG  ED ECG REPORT I, Nita Sickle, the attending physician, personally viewed and interpreted this ECG.  Sinus tachycardia the rate of 127, no ST elevations, normal intervals. ____________________________________________  RADIOLOGY  I have personally reviewed the images performed during this visit and I agree with the Radiologist's read.   Interpretation by Radiologist:  DG Chest 1 View  Result Date: 07/03/2021 CLINICAL DATA:  Hyperglycemia EXAM: CHEST  1 VIEW COMPARISON:  05/26/2019 FINDINGS: Normal heart size and mediastinal contours. No acute infiltrate or edema. No effusion or pneumothorax. No acute osseous findings. Artifact from EKG leads IMPRESSION: No evidence of active disease. Electronically Signed   By: Tiburcio Pea M.D.   On: 07/03/2021 04:19     ____________________________________________   PROCEDURES  Procedure(s) performed:yes .1-3 Lead EKG Interpretation Performed by: Nita Sickle, MD Authorized by: Nita Sickle, MD      Interpretation: non-specific     ECG rate assessment: tachycardic     Rhythm: sinus tachycardia     Ectopy: none     Conduction: normal     Critical Care performed: yes  CRITICAL CARE Performed by: Nita Sickle  ?  Total critical care time: 45 min  Critical care time was exclusive of separately billable procedures and treating other patients.  Critical care was necessary to treat or prevent imminent or life-threatening deterioration.  Critical care was time spent personally by me on the following activities: development of treatment plan with patient and/or surrogate as well as nursing, discussions with consultants, evaluation of patient's response to treatment, examination of patient, obtaining history from patient or surrogate, ordering and performing treatments and interventions, ordering and review of laboratory studies, ordering and review of radiographic studies, pulse oximetry and re-evaluation of patient's condition.  ____________________________________________   INITIAL IMPRESSION / ASSESSMENT AND PLAN / ED COURSE  20 y.o. male with a history of type 1 diabetes, celiac disease, seizures, alcohol and cocaine abuse who presents for hyperglycemia and concerns of DKA.  Patient arrives ill-appearing, confused, tachypneic with clue small respirations, tachycardic, end-tidal CO2 of 8, extremely dry on exam concerning for severe DKA.  Patient was started on 2 L bolus of LR.  VBG showing pH of 6.92 with a bicarb of 6.2 and a PCO2 of 30.  Anion gap of 30, blood glucose of 662.  AKI with a creatinine of 1.4 (patient baseline is 0.5).  Elevated white count.  Lactic of 2.9.  COVID and flu negative.  Chest x-ray with no signs of pneumonia.  Beta hydroxybutyric acid is pending.  UA negative for UTI.  Patient is in severe DKA possibly from medication noncompliance, alcohol use, and a viral syndrome.  Will initiate insulin drip and 1/3 L bolus.  K of 5.9 therefore no K supplementation needed  at this time.  I did discuss with ICU NP for admission.  Will monitor closely for any changes or worsening of mental state.  At this time patient is maintaining his airway and does not need to be intubated.  He was placed on telemetry for close monitoring of cardiorespiratory status.  Old medical records reviewed including recent admission for months ago  _____________________________________________ Please note:  Patient was evaluated in Emergency Department today for the symptoms described in the history of present illness. Patient was evaluated in the context of the global COVID-19 pandemic, which necessitated consideration that the patient might be at risk for infection with the SARS-CoV-2 virus that causes COVID-19. Institutional protocols and algorithms that pertain to the evaluation of patients at risk for COVID-19 are in a state of rapid change based on information released by regulatory bodies including the CDC and federal and state organizations. These policies and algorithms were followed during the patient's care in the ED.  Some ED evaluations and interventions may be delayed as a result of limited staffing during the pandemic.   [place] Controlled Substance Database was reviewed by me. ____________________________________________   FINAL CLINICAL IMPRESSION(S) / ED DIAGNOSES   Final diagnoses:  Cough  Diabetic ketoacidosis without coma associated with type 1 diabetes mellitus (HCC)  AKI (acute kidney injury) (HCC)      NEW MEDICATIONS STARTED DURING THIS VISIT:  ED Discharge Orders     None        Note:  This document was prepared using Dragon voice recognition software and may include unintentional dictation errors.    Don Perking, [place], MD 07/03/21 0500

## 2021-07-03 NOTE — H&P (Addendum)
NAME:  Jesus Ewing, MRN:  [id number], DOB:  Nov 23, 2000, LOS: 0 ADMISSION DATE:  07/03/2021, CONSULTATION DATE:  07/03/21 REFERRING MD:  Nita Sickle MD CHIEF COMPLAINT: Hyperglycemia    HPI  20 y.o male  with significant PMH of type 1 diabetes mellitus, celiac disease, seizure disorder, polysubstance abuse (EtOH abuse, cocaine and amphetamine) who presented to the ED with hyperglycemia and concerns for DKA.  Patient reports that he has been sick with cough, congestion, body aches, chills and fevers for the past 2 days.  Per ED reports, patient's family noted that he was acting unusual from baseline and were concerned for possible DKA.  Patient apparently denied illicit drug use since last admission to the hospital in August.  He however endorses drinking alcohol and stated that he drank 1 earlier today.  He reports nausea vomiting none bloody nonbilious emesis.  Denies other associated symptoms of chest pain shortness of breath or abdominal pain.  ED Course: On arrival to the ED, he was afebrile with blood pressure 142/87 mm Hg and pulse rate  125 beats/min., RR 32, Sats 99%. There were no focal neurological deficits; he was alert and oriented to self and place and slightly confused.  Patient appeared uncomfortable noted to be tachycardic, tachypneic with Kussmaul respirations. Pertinent Labs in Red/Diagnostics Findings: Na+/ K+: 131/5.9 Glucose: >662 BUN/Cr.: 27/1.40 Calcium: 9.5 CO2:<7 Anion Gap :Not calculated AST/ALT:76/81   WBC/ TMAX: 16.6/ afebrile Hgb/Hct:17.8/52.5 Plts: 451 PCT: pending Lactic acid: 2.9 COVID PCR: Negative UDS: Negative VBG result:  pO2 36; pCO2 30; pH 6.92;  HCO3 6.2, %O2 Sat 32.1.   EKG: unchanged from previous tracings, sinus tachycardia. CXR: showed no evidence of active cardiopulmonary process. Patient was found to be in severe DKA possibly from medication noncompliance, alcohol use and a viral infection.  Patient was resuscitated with  IV fluids boluses and insulin drip per DKA protocol.  PCCM consulted for further management.  Past Medical History    DKA (diabetic ketoacidosis) (HCC) 02/14/2021   Seizure (HCC) 02/14/2021   AKI (acute kidney injury) (HCC) 02/14/2021   Diabetes mellitus without complication (HCC) 02/14/2021   Overdose of trazodone 02/14/2021   Depression 02/14/2021   DKA (diabetic ketoacidoses) 05/26/2019   DKA, type 1 (HCC) 06/19/2016   Celiac disease 06/19/2016    Significant Hospital Events   12/18: Admitted to the ICU with severe DKA  Consults:  PCCM Diabetes Co-ordinator  Procedures:  None  Significant Diagnostic Tests:  12/18: Chest Xray>No active cardiopulmonary process  Micro Data:  12/18: SARS-CoV-2 PCR> negative 12/18: Influenza PCR> negative 12/18: Blood culture x2> 12/18: Urine Culture> 12/18: MRSA PCR>>   Antimicrobials:  None   OBJECTIVE  Blood pressure 96/86, pulse (!) 124, resp. rate (!) 21, SpO2 100 %.        Intake/Output Summary (Last 24 hours) at 07/03/2021 0451 Last data filed at 07/03/2021 0444 Gross per 24 hour  Intake 2000 ml  Output --  Net 2000 ml   There were no vitals filed for this visit.   Physical Examination  GENERAL:  20 year-old critically ill patient lying in the bed in mild respiratory distress.  EYES: Pupils equal, round, reactive to light and accommodation. No scleral icterus. Extraocular muscles intact.  HEENT: Head atraumatic, normocephalic. Oropharynx and nasopharynx clear.  NECK:  Supple, no jugular venous distention. No thyroid enlargement, no tenderness.  LUNGS: Normal breath sounds bilaterally, no wheezing, rales,rhonchi or crepitation. No use of accessory muscles of respiration.  Kussmaul breathing noted CARDIOVASCULAR: S1,  S2 normal. No murmurs, rubs, or gallops.  ABDOMEN: Soft, nontender, nondistended. Bowel sounds present. No organomegaly or mass.  EXTREMITIES: No pedal edema, cyanosis, or clubbing.  NEUROLOGIC: Cranial  nerves II through XII are intact.  Muscle strength 5/5 in all extremities. Sensation intact. Gait not checked.  PSYCHIATRIC: The patient is alert and oriented x 3.  SKIN: No obvious rash, lesion, or ulcer.   Labs/imaging that I havepersonally reviewed  (right click and "Reselect all SmartList Selections" daily)     Labs   CBC: Recent Labs  Lab 07/03/21 0357  WBC 16.6*  NEUTROABS 12.8*  HGB 17.8*  HCT 52.5*  MCV 102.7*  PLT 451*    Basic Metabolic Panel: Recent Labs  Lab 07/03/21 0357  NA 131*  K 5.9*  CL 94*  CO2 <7*  GLUCOSE 662*  BUN 27*  CREATININE 1.40*  CALCIUM 9.5   GFR: CrCl cannot be calculated (Unknown ideal weight.). Recent Labs  Lab 07/03/21 0357  WBC 16.6*  LATICACIDVEN 2.9*    Liver Function Tests: Recent Labs  Lab 07/03/21 0357  AST 76*  ALT 81*  ALKPHOS 175*  BILITOT 3.1*  PROT 9.3*  ALBUMIN 5.0   No results for input(s): LIPASE, AMYLASE in the last 168 hours. No results for input(s): AMMONIA in the last 168 hours.  ABG    Component Value Date/Time   HCO3 6.2 (L) 07/03/2021 0401   ACIDBASEDEF 25.9 (H) 07/03/2021 0401   O2SAT 32.1 07/03/2021 0401     Coagulation Profile: No results for input(s): INR, PROTIME in the last 168 hours.  Cardiac Enzymes: No results for input(s): CKTOTAL, CKMB, CKMBINDEX, TROPONINI in the last 168 hours.  HbA1C: Hgb A1c MFr Bld  Date/Time Value Ref Range Status  02/15/2021 04:06 AM 10.4 (H) 4.8 - 5.6 % Final    Comment:    (NOTE) Pre diabetes:          5.7%-6.4%  Diabetes:              >6.4%  Glycemic control for   <7.0% adults with diabetes   05/26/2019 03:33 PM 10.1 (H) 4.8 - 5.6 % Final    Comment:    (NOTE) Pre diabetes:          5.7%-6.4% Diabetes:              >6.4% Glycemic control for   <7.0% adults with diabetes     CBG: Recent Labs  Lab 07/03/21 0357  GLUCAP >600*    Review of Systems:   Review of Systems  Constitutional:  Positive for chills, fever and  malaise/fatigue.  HENT:  Positive for congestion, sinus pain and sore throat.   Eyes: Negative.   Respiratory:  Positive for cough and shortness of breath.   Cardiovascular: Negative.   Gastrointestinal:  Positive for nausea and vomiting.  Musculoskeletal:  Positive for myalgias.  Skin: Negative.   Neurological:  Positive for seizures. Negative for dizziness, tingling, tremors, sensory change, speech change, loss of consciousness and headaches.  Endo/Heme/Allergies:  Positive for polydipsia.  Psychiatric/Behavioral: Negative.     Past Medical History  He,  has a past medical history of Celiac disease and Diabetes mellitus without complication (HCC).   Surgical History    Past Surgical History:  Procedure Laterality Date   NO PAST SURGERIES       Social History   reports that he has never smoked. He has never used smokeless tobacco. He reports current alcohol use. He reports current drug use.  Drug: "Crack" cocaine.   Family History   His Family history is unknown by patient.   Allergies No Known Allergies   Home Medications  Prior to Admission medications   Medication Sig Start Date End Date Taking? Authorizing Provider  insulin lispro (HUMALOG) 100 UNIT/ML KwikPen Inject 0-50 Units into the skin as directed. 07/24/18  Yes [provider]  LANTUS SOLOSTAR 100 UNIT/ML Solostar Pen Inject 50 Units into the skin at bedtime. 05/04/19  Yes [provider]  levETIRAcetam (KEPPRA) 500 MG tablet Take 1 tablet (500 mg total) by mouth 2 (two) times daily. 02/15/21 07/03/21 Yes Tresa Moore, MD    Scheduled Meds: Continuous Infusions:  dextrose 5% lactated ringers     dextrose 5% lactated ringers     insulin 9.5 Units/hr (07/03/21 0456)   lactated ringers     lactated ringers 125 mL/hr at 07/03/21 0457   lactated ringers     PRN Meds:.dextrose, docusate sodium, polyethylene glycol   Assessment & Plan:  Diabetic Ketoacidosis likely due to  noncompliance Severe Anion Gap Metabolic Acidosis PMHx: DM type 1  -Trigger Assessment (CXR, UA negative, Blood  and urine Cultures for infection pending) -Will check Lipase for pancreatitis -Received Insulin (regular) 0.1u/kg (~10 units) IV x1. Continue Insulin drip, DKA protocol -Keep NPO -Glucose: q1h to titrate insulin -Lab monitoring: q2-4h BMP+Phosphorus+pH (ABG/VBG) to assess severity of acidemia -Aggressive  volume repletion  -Goal to normalize anion gap  -When normalize anion gap and low stable insulin requirement for 4 hours will start  long acting insulin (NPH) then stop drip 2-3 hours AFTER to bridge effect and start carb-controlled diet. 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 Evaluated by Neurosurgery and Neurology -Was discharged on Keppra during last admission -Continue Keppra 500 mg BID -Seizure precaution -Lorazepam 2mg  IV PRN seizures  Polysubstance Abuse Hx of  Etoh Abuse, Cocaine and Amphetamine use -High risk for Etoh withdrawal -Check Volatile Screen (EtOH, Osmol Gap), UTox -Check CMP, INR, Daily BMP+Mg -Daily Thiamine, Folate, MVI once tolerating PO -SW consult for cessation resources -PT/OT evaluation for mobility -CIWA +/- Standing Protocol   Elevated LFTs Likely in the setting of Alcohol abuse -Lipid profile, CK and TSH -Will continue to follow  Best practice:   Diet:  NPO Pain/Anxiety/Delirium protocol (if indicated): No VAP protocol (if indicated): Not indicated DVT prophylaxis: SCD GI prophylaxis: PPI Glucose control:  Insulin gtt Central venous access:  N/A Arterial line:  N/A Foley:  Yes, and it is still needed Mobility:  bed rest  PT consulted: N/A Last date of multidisciplinary goals of care discussion [12/18] Code Status:  full code Disposition: ICU   = Goals of Care = Code Status Order: FULL  Primary Emergency Contact: Flanary,Kimberly, Home Phone: [phone] Wishes to pursue full aggressive treatment and intervention options, including CPR and intubation, but goals of care will be addressed on going with family if that should become necessary.  Critical care time: 45 minutes     [phone], DNP, CCRN, FNP-C,  AGACNP-BC Acute Care Nurse Practitioner  [facility] Pager: [phone] [facility]  .

## 2021-07-03 NOTE — Progress Notes (Signed)
Patient had episode of aggitation and reports previous hx of cocaine abuse and ADHD.  He reports substance abuse history.  He is agreeable for Ativan to help with aggitation while acutely ill in DKA.    Vida Rigger, M.D.  Pulmonary & Critical Care Medicine  [facility]

## 2021-07-04 LAB — GLUCOSE, CAPILLARY
Glucose-Capillary: 148 mg/dL — ABNORMAL HIGH (ref 70–99)
Glucose-Capillary: 161 mg/dL — ABNORMAL HIGH (ref 70–99)
Glucose-Capillary: 221 mg/dL — ABNORMAL HIGH (ref 70–99)
Glucose-Capillary: 286 mg/dL — ABNORMAL HIGH (ref 70–99)
Glucose-Capillary: 312 mg/dL — ABNORMAL HIGH (ref 70–99)
Glucose-Capillary: 331 mg/dL — ABNORMAL HIGH (ref 70–99)
Glucose-Capillary: 405 mg/dL — ABNORMAL HIGH (ref 70–99)
Glucose-Capillary: 467 mg/dL — ABNORMAL HIGH (ref 70–99)

## 2021-07-04 LAB — BASIC METABOLIC PANEL
Anion gap: 9 (ref 5–15)
BUN: 12 mg/dL (ref 6–20)
CO2: 20 mmol/L — ABNORMAL LOW (ref 22–32)
Calcium: 8.6 mg/dL — ABNORMAL LOW (ref 8.9–10.3)
Chloride: 109 mmol/L (ref 98–111)
Creatinine, Ser: 0.56 mg/dL — ABNORMAL LOW (ref 0.61–1.24)
GFR, Estimated: >60 mL/min (ref >60)
Glucose, Bld: 151 mg/dL — ABNORMAL HIGH (ref 70–99)
Potassium: 3.3 mmol/L — ABNORMAL LOW (ref 3.5–5.1)
Sodium: 138 mmol/L (ref 135–145)

## 2021-07-04 LAB — URINE CULTURE: Culture: NO GROWTH

## 2021-07-04 LAB — MAGNESIUM: Magnesium: 2 mg/dL (ref 1.7–2.4)

## 2021-07-04 LAB — HEMOGLOBIN A1C
Hgb A1c MFr Bld: 10.3 % — ABNORMAL HIGH (ref 4.8–5.6)
Hgb A1c MFr Bld: 10 % — ABNORMAL HIGH (ref 4.8–5.6)
Mean Plasma Glucose: 249 mg/dL
Mean Plasma Glucose: 240 mg/dL

## 2021-07-04 LAB — PHOSPHORUS: Phosphorus: 2.1 mg/dL — ABNORMAL LOW (ref 2.5–4.6)

## 2021-07-04 MED ORDER — K PHOS MONO-SOD PHOS DI & MONO 155-852-130 MG PO TABS
500.0000 mg | ORAL_TABLET | ORAL | Status: AC
  Administered 2021-07-04 (×2): 500 mg via ORAL
  Filled 2021-07-04 (×2): qty 2

## 2021-07-04 MED ORDER — LEVETIRACETAM 500 MG PO TABS
500.0000 mg | ORAL_TABLET | Freq: Two times a day (BID) | ORAL | Status: DC
  Administered 2021-07-04 – 2021-07-05 (×2): 500 mg via ORAL
  Filled 2021-07-04 (×3): qty 1

## 2021-07-04 MED ORDER — INSULIN ASPART 100 UNIT/ML IJ SOLN
0.0000 [IU] | Freq: Three times a day (TID) | INTRAMUSCULAR | Status: DC
  Administered 2021-07-04: 17:00:00 3 [IU] via SUBCUTANEOUS
  Administered 2021-07-04: 11:00:00 7 [IU] via SUBCUTANEOUS
  Administered 2021-07-05: 09:00:00 3 [IU] via SUBCUTANEOUS
  Administered 2021-07-05: 12:00:00 6 [IU] via SUBCUTANEOUS
  Filled 2021-07-04 (×4): qty 1

## 2021-07-04 MED ORDER — POTASSIUM CHLORIDE CRYS ER 20 MEQ PO TBCR
20.0000 meq | EXTENDED_RELEASE_TABLET | Freq: Once | ORAL | Status: AC
  Administered 2021-07-04: 17:00:00 20 meq via ORAL
  Filled 2021-07-04: qty 1

## 2021-07-04 MED ORDER — INSULIN ASPART 100 UNIT/ML IJ SOLN
10.0000 [IU] | Freq: Once | INTRAMUSCULAR | Status: AC
  Administered 2021-07-04: 23:00:00 10 [IU] via SUBCUTANEOUS
  Filled 2021-07-04: qty 1

## 2021-07-04 MED ORDER — INSULIN ASPART 100 UNIT/ML IJ SOLN: 0.0000 [IU] | Freq: Every day | INTRAMUSCULAR | Status: DC

## 2021-07-04 MED ORDER — INSULIN ASPART 100 UNIT/ML IJ SOLN
4.0000 [IU] | Freq: Three times a day (TID) | INTRAMUSCULAR | Status: DC
  Administered 2021-07-04 – 2021-07-05 (×3): 4 [IU] via SUBCUTANEOUS
  Filled 2021-07-04 (×2): qty 1

## 2021-07-04 NOTE — Progress Notes (Signed)
CRITICAL CARE PROGRESS NOTE    Name: Jesus Ewing MRN: [id number] DOB: 12-19-2000     LOS: 1   SUBJECTIVE FINDINGS & SIGNIFICANT EVENTS    Patient description:  20 y.o male  with significant PMH of type 1 diabetes mellitus, celiac disease, seizure disorder, polysubstance abuse (EtOH abuse, cocaine and amphetamine) who presented to the ED with hyperglycemia and concerns for DKA.  07/04/21- DKA resolved patient transferred to [facility]  Lines/tubes :   Microbiology/Sepsis markers: Results for orders placed or performed during the hospital encounter of 07/03/21  Resp Panel by RT-PCR (Flu A&B, Covid) Nasopharyngeal Swab     Status: None   Collection Time: 07/03/21  3:57 AM   Specimen: Nasopharyngeal Swab; Nasopharyngeal(NP) swabs in vial transport medium  Result Value Ref Range Status   SARS Coronavirus 2 by RT PCR NEGATIVE NEGATIVE Final    Comment: (NOTE) SARS-CoV-2 target nucleic acids are NOT DETECTED.  The SARS-CoV-2 RNA is generally detectable in upper respiratory specimens during the acute phase of infection. The lowest concentration of SARS-CoV-2 viral copies this assay can detect is 138 copies/mL. A negative result does not preclude SARS-Cov-2 infection and should not be used as the sole basis for treatment or other patient management decisions. A negative result may occur with  improper specimen collection/handling, submission of specimen other than nasopharyngeal swab, presence of viral mutation(s) within the areas targeted by this assay, and inadequate number of viral copies(<138 copies/mL). A negative result must be combined with clinical observations, patient history, and epidemiological information. The expected result is Negative.  Fact Sheet for Patients:   BloggerCourse.com  Fact Sheet for Healthcare Providers:  SeriousBroker.it  This test is no t yet approved or cleared by the [place] FDA and  has been authorized for detection and/or diagnosis of SARS-CoV-2 by FDA under an Emergency Use Authorization (EUA). This EUA will remain  in effect (meaning this test can be used) for the duration of the COVID-19 declaration under Section 564(b)(1) of the Act, 21 U.S.C.section 360bbb-3(b)(1), unless the authorization is terminated  or revoked sooner.       Influenza A by PCR NEGATIVE NEGATIVE Final   Influenza B by PCR NEGATIVE NEGATIVE Final    Comment: (NOTE) The Xpert Xpress SARS-CoV-2/FLU/RSV plus assay is intended as an aid in the diagnosis of influenza from Nasopharyngeal swab specimens and should not be used as a sole basis for treatment. Nasal washings and aspirates are unacceptable for Xpert Xpress SARS-CoV-2/FLU/RSV testing.  Fact Sheet for Patients: BloggerCourse.com  Fact Sheet for Healthcare Providers: SeriousBroker.it  This test is not yet approved or cleared by the [place] FDA and has been authorized for detection and/or diagnosis of SARS-CoV-2 by FDA under an Emergency Use Authorization (EUA). This EUA will remain in effect (meaning this test can be used) for the duration of the COVID-19 declaration under Section 564(b)(1) of the Act, 21 U.S.C. section 360bbb-3(b)(1), unless the authorization is terminated or revoked.  Performed at [facility], [address] [place], [place] [address]   Culture, blood (routine x 2)     Status: None (Preliminary result)   Collection Time: 07/03/21  6:33 AM   Specimen: BLOOD  Result Value Ref Range Status   Specimen Description BLOOD BLOOD RIGHT HAND  Final   Special Requests   Final    BOTTLES DRAWN AEROBIC AND ANAEROBIC Blood Culture results may not be optimal  due to an inadequate volume of blood received in culture bottles   Culture  Final    NO GROWTH < 24 HOURS Performed at [facility], [address] [place], [place] [address]    Report Status PENDING  Incomplete  Culture, blood (routine x 2)     Status: None (Preliminary result)   Collection Time: 07/03/21  6:33 AM   Specimen: BLOOD  Result Value Ref Range Status   Specimen Description BLOOD RIGHT ANTECUBITAL  Final   Special Requests   Final    BOTTLES DRAWN AEROBIC AND ANAEROBIC Blood Culture results may not be optimal due to an inadequate volume of blood received in culture bottles   Culture   Final    NO GROWTH < 24 HOURS Performed at [facility], [address] [place], [place] [address]    Report Status PENDING  Incomplete  MRSA Next Gen by PCR, Nasal     Status: None   Collection Time: 07/03/21  9:21 PM   Specimen: Nasal Mucosa; Nasal Swab  Result Value Ref Range Status   MRSA by PCR Next Gen NOT DETECTED NOT DETECTED Final    Comment: (NOTE) The GeneXpert MRSA Assay (FDA approved for NASAL specimens only), is one component of a comprehensive MRSA colonization surveillance program. It is not intended to diagnose MRSA infection nor to guide or monitor treatment for MRSA infections. Test performance is not FDA approved in patients less than 58 years old. Performed at [facility], [address] [place], [place] [address]     Anti-infectives:  Anti-infectives (From admission, onward)    None        PAST MEDICAL HISTORY   Past Medical History:  Diagnosis Date   Celiac disease    Diabetes mellitus without complication (HCC)      SURGICAL HISTORY   Past Surgical History:  Procedure Laterality Date   NO PAST SURGERIES       FAMILY HISTORY   Family History  Family history unknown: Yes     SOCIAL HISTORY   Social History   Tobacco Use   Smoking status: Never   Smokeless tobacco: Never  Vaping Use   Vaping  Use: Every day  Substance Use Topics   Alcohol use: Yes   Drug use: Yes    Types: "Crack" cocaine     MEDICATIONS   Current Medication:  Current Facility-Administered Medications:    Chlorhexidine Gluconate Cloth 2 % PADS 6 each, 6 each, Topical, Q0600, Aliyanna Wassmer, MD   dextrose 50 % solution 0-50 mL, 0-50 mL, Intravenous, PRN, Don Perking, [place], MD   docusate sodium (COLACE) capsule 100 mg, 100 mg, Oral, BID PRN, Jimmye Norman, NP   guaiFENesin (ROBITUSSIN) 100 MG/5ML liquid 5 mL, 5 mL, Oral, Q4H PRN, Karna Christmas, Nilani Hugill, MD, 5 mL at 07/03/21 1700   insulin aspart (novoLOG) injection 0-5 Units, 0-5 Units, Subcutaneous, QHS, Harlon Ditty D, NP   insulin aspart (novoLOG) injection 0-9 Units, 0-9 Units, Subcutaneous, TID WC, Harlon Ditty D, NP   insulin aspart (novoLOG) injection 4 Units, 4 Units, Subcutaneous, TID WC, Harlon Ditty D, NP   insulin glargine-yfgn (SEMGLEE) injection 40 Units, 40 Units, Subcutaneous, Daily, Karna Christmas, Yaritzy Huser, MD, 40 Units at 07/03/21 1455   levETIRAcetam (KEPPRA) IVPB 500 mg/100 mL premix, 500 mg, Intravenous, Q12H, [place], Hubbard Hartshorn, NP, Last Rate: 400 mL/hr at 07/04/21 0800, Infusion Verify at 07/04/21 0800   polyethylene glycol (MIRALAX / GLYCOLAX) packet 17 g, 17 g, Oral, Daily PRN, Jimmye Norman, NP    ALLERGIES   Patient has no known allergies.  REVIEW OF SYSTEMS     10 point ROS + left abd pain and depression   PHYSICAL EXAMINATION   Vital Signs: Temp:  [98.1 F (36.7 C)-99.4 F (37.4 C)] 98.4 F (36.9 C) (12/19 0800) Pulse Rate:  [90-117] 94 (12/19 0800) Resp:  [14-26] 14 (12/19 0800) BP: (104-133)/(46-79) 104/62 (12/19 0800) SpO2:  [96 %-100 %] 97 % (12/19 0800) Weight:  [73.4 kg] 73.4 kg (12/19 0406)  GENERAL:Age appropriate HEAD: Normocephalic, atraumatic.  EYES: Pupils equal, round, reactive to light.  No scleral icterus.  MOUTH: Moist mucosal membrane. NECK: Supple. No thyromegaly. No  nodules. No JVD.  PULMONARY: mild rhonchi b/l CARDIOVASCULAR: S1 and S2. Regular rate and rhythm. No murmurs, rubs, or gallops.  GASTROINTESTINAL: Soft, nontender, non-distended. No masses. Positive bowel sounds. No hepatosplenomegaly.  MUSCULOSKELETAL: No swelling, clubbing, or edema.  NEUROLOGIC: Mild distress due to acute illness SKIN:intact,warm,dry   PERTINENT DATA     Infusions:  levETIRAcetam 400 mL/hr at 07/04/21 0800   Scheduled Medications:  Chlorhexidine Gluconate Cloth  6 each Topical Q0600   insulin aspart  0-5 Units Subcutaneous QHS   insulin aspart  0-9 Units Subcutaneous TID WC   insulin aspart  4 Units Subcutaneous TID WC   insulin glargine-yfgn  40 Units Subcutaneous Daily   PRN Medications: dextrose, docusate sodium, guaiFENesin, polyethylene glycol Hemodynamic parameters:   Intake/Output: 12/18 0701 - 12/19 0700 In: 598.6 [P.O.:480; I.V.:18.6; IV Piggyback:100] Out: 2900 [Urine:2900]  Ventilator  Settings:     LAB RESULTS:  Basic Metabolic Panel: Recent Labs  Lab 07/03/21 0357 07/03/21 0633 07/03/21 1002 07/03/21 1313 07/04/21 0335  NA 131* 139 136 136 138  K 5.9* 5.0 4.3 4.0 3.3*  CL 94* 107 110 109 109  CO2 <7* <7* 8* 12* 20*  GLUCOSE 662* 364* 179* 142* 151*  BUN 27* 22* 16 14 12   CREATININE 1.40* 1.10 0.84 0.76 0.56*  CALCIUM 9.5 9.0 8.2* 8.2* 8.6*  MG  --  2.4  --   --  2.0  PHOS  --  5.6*  --   --  2.1*   Liver Function Tests: Recent Labs  Lab 07/03/21 0357  AST 76*  ALT 81*  ALKPHOS 175*  BILITOT 3.1*  PROT 9.3*  ALBUMIN 5.0   Recent Labs  Lab 07/03/21 0633  LIPASE 76*   No results for input(s): AMMONIA in the last 168 hours. CBC: Recent Labs  Lab 07/03/21 0357 07/03/21 0633  WBC 16.6* 16.9*  NEUTROABS 12.8*  --   HGB 17.8* 16.1  HCT 52.5* 46.7  MCV 102.7* 100.9*  PLT 451* 338   Cardiac Enzymes: No results for input(s): CKTOTAL, CKMB, CKMBINDEX, TROPONINI in the last 168 hours. BNP: Invalid input(s):  POCBNP CBG: Recent Labs  Lab 07/03/21 1638 07/03/21 1928 07/03/21 2320 07/04/21 0348 07/04/21 0733  GLUCAP 129* 88 241* 148* 161*       IMAGING RESULTS:  Imaging: DG Chest 1 View  Result Date: 07/03/2021 CLINICAL DATA:  Hyperglycemia EXAM: CHEST  1 VIEW COMPARISON:  05/26/2019 FINDINGS: Normal heart size and mediastinal contours. No acute infiltrate or edema. No effusion or pneumothorax. No acute osseous findings. Artifact from EKG leads IMPRESSION: No evidence of active disease. Electronically Signed   By: 13/03/2019 M.D.   On: 07/03/2021 04:19   @PROBHOSP @ No results found.   ASSESSMENT AND PLAN    -Multidisciplinary rounds held today Diabetic Ketoacidosis likely due to noncompliance Severe Anion Gap Metabolic Acidosis PMHx: DM type 1  -Trigger Assessment (CXR,  UA negative, Blood  and urine Cultures for infection pending) -Will check Lipase for pancreatitis -Received Insulin (regular) 0.1u/kg (~10 units) IV x1. Continue Insulin drip, DKA protocol -Keep NPO -Glucose: q1h to titrate insulin -Lab monitoring: q2-4h BMP+Phosphorus+pH (ABG/VBG) to assess severity of acidemia -Aggressive  volume repletion  -Goal to normalize anion gap  -When normalize anion gap and low stable insulin requirement for 4 hours will start  long acting insulin (NPH) then stop drip 2-3 hours AFTER to bridge effect and start carb-controlled diet. 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 Evaluated by Neurosurgery and Neurology -Was discharged on Keppra during last admission -Continue Keppra 500 mg BID -Seizure precaution -Lorazepam  IV PRN seizures   Polysubstance Abuse Hx of  Etoh Abuse, Cocaine and Amphetamine use -High risk for Etoh withdrawal -Check Volatile Screen (EtOH, Osmol Gap), UTox -Check CMP, INR, Daily BMP+Mg -Daily Thiamine, Folate, MVI once tolerating PO -SW consult for cessation resources -PT/OT evaluation for mobility -CIWA +/- Standing Protocol   Elevated LFTs Likely in the setting of Alcohol abuse -Lipid profile, CK and TSH -Will continue to follow   Best practice:  Diet:  NPO Pain/Anxiety/Delirium protocol (if indicated): No VAP protocol (if indicated): Not indicated DVT prophylaxis: SCD GI prophylaxis: PPI Glucose control:  Insulin gtt Central venous access:  N/A Arterial line:  N/A Foley:  Yes, and it is still needed Mobility:  bed rest  PT consulted: N/A Last date of multidisciplinary goals of care discussion [12/18] Code Status:  full code Disposition: ICUGI/Nutrition GI PROPHYLAXIS as indicated DIET-->TF's as tolerated Constipation protocol as indicated  ENDO - ICU hypoglycemic\Hyperglycemia protocol -check FSBS per protocol   ELECTROLYTES -follow labs as needed -replace as needed -pharmacy consultation   DVT/GI PRX ordered -SCDs  TRANSFUSIONS AS NEEDED MONITOR FSBS ASSESS the need for LABS as needed   Critical care provider statement:   Critical care provider statement:   Total critical care time: 33 minutes   Performed by: Karna Christmas MD   Critical care time was exclusive of separately billable procedures and treating other patients.   Critical care was necessary to treat or prevent imminent or life-threatening deterioration.   Critical care was time spent personally by me on the following activities:  development of treatment plan with patient and/or surrogate as well as nursing, discussions with consultants, evaluation of patient's response to treatment, examination of patient, obtaining history from patient or surrogate, ordering and performing treatments and interventions, ordering and review of laboratory studies, ordering and review of radiographic studies, pulse oximetry and re-evaluation of patient's condition.    Vida Rigger, M.D.  Pulmonary & Critical Care Medicine

## 2021-07-04 NOTE — Progress Notes (Addendum)
Inpatient Diabetes Program Recommendations  AACE/ADA: New Consensus Statement on Inpatient Glycemic Control   Target Ranges:  Prepandial:   less than 140 mg/dL      Peak postprandial:   less than 180 mg/dL (1-2 hours)      Critically ill patients:  140 - 180 mg/dL    Latest Reference Range & Units 07/03/21 03:57  CO2 22 - 32 mmol/L <7 (L)  Glucose 70 - 99 mg/dL 845 (HH)  Anion gap 5 - 15  NOT CALCULATED   Glycemic Control  Diabetes history: DM1 Outpatient Diabetes medications: Lantus 50 units QHS, Humalog (1 unit for 8 grams of carbs, 1 unit drops glucose 25 mg/dl) Current orders for Inpatient glycemic control: Semglee 40 units daily, Novolog 0-9 units Q4H  Inpatient Diabetes Program Recommendations:    Insulin: Please consider changing CBGs to AC&HS, Novolog 0-9 units to AC&HS, and ordering Novolog 4 units TID with meals for meal coverage if patient eats at least 50% of meals.  Discharge needs: At time of discharge, please provide Rx for Humalog Kwikpens [phone]), Dexcom G6 sensors ([phone]), Dexcom G6 transmitters [phone]).  NOTE: Per chart, patient has Type 1 DM admitted with DKA. Per H&P, patient has been sick with cough, congestion, body aches, chills, and fever for 2 days. Initial glucose 662 mg/dl and DKA was treated with IV insulin which was transitioned to SQ insulin on 07/03/21 (received Semglee 40 units at 14:55.   Noted patient is followed by [facility] for DM management and last appointment was 09/29/20. Patient was last inpatient at [facility] 02/14/21-02/15/21 and seen by inpatient diabetes coordinator on 02/15/21. No follow up noted in the chart with Endocrinology since March 2022. Would encourage patient to follow up with Endocrinology and consistently follow up every 3-4 months.  Will plan to follow up with patient today.  Addendum 07/04/21@11 :10-Spoke with patient about diabetes and home regimen for diabetes control. Patient reports he was dx with DM1 in 2014 and is followed  by [facility]. Patient states that he started feeling bad (cough, aching, fever) this past Thursday and that he has not been able to really eat or drink much since then. He states he was nauseous and vomiting prior to admission as well. Patient states that he takes Lantus 50 units daily and has Humalog for carb coverage (1 unit for every 8 grams of carbs). Patient states that he is good about making sure he takes his Lantus every day but he admits that he does not consistently take the Humalog. Patient states that he was told by a provider in the past that if he consistently takes his Lantus, it would keep him from developing DKA.  Discussed Lantus and Humalog and explained that he really needs to be taking basal, meal coverage insulin, and correction to keep glucose well controlled. Also discussed DKA and explained that in most cases when DKA occurs it causes dehydration and SQ insulin may not be absorbed very well.  Explained that since he has Type 1 diabetes he really needs to be taking basal, carb coverage insulin and correction consistently. Patient states that he usually uses a Dexcom CGM but he is currently out of Dexcom supplies and has requested refill through [facility] patient portal system but has not gotten any response. Patient states he has all supplies for finger sticks to use until he gets Dexcom refilled. Patient also notes that he is out of Humalog (insulin pens) insulin as well and has done the same requesting refill online on  the [facility] patient portal system but no response. Patient reports that he has plenty of Lantus and insulin pen needles.  Patient's last visit with [facility] was in March 2022 and patient states that he missed his last appointment that was scheduled because he had just started a new job.  Patient states that when he had his Dexcom CGM sensor on, his average glucose is around 250 mg/dl. Discussed A1C results (10% on 07/03/21) and explained that current A1C indicates  an average glucose of 240 mg/dl over the past 2-3 months. Discussed glucose and A1C goals. Discussed importance of checking CBGs and maintaining good CBG control to prevent long-term and short-term complications. Explained how hyperglycemia leads to damage within blood vessels which lead to the common complications seen with uncontrolled diabetes. Stressed to the patient the importance of improving glycemic control to prevent further complications from uncontrolled diabetes especially given he is 20 years old. Patient states that he is considering using an insulin pump and plans to talk with his Endocrinologist about insulin pumps at next visit.  Encouraged patient to make follow up appointment with [facility] for follow up. Patient also noted that he was a drug user and he stopped using drugs 5 months ago. Commended patient from sustaining from drugs for 5 months and encouraged him to continue to refrain from drug use.   Patient verbalized understanding of information discussed and reports no further questions at this time related to diabetes.  Thanks, Orlando Penner, RN, MSN, CDE Diabetes Coordinator Inpatient Diabetes Program [phone] (Team Pager from 8am to 5pm)

## 2021-07-04 NOTE — Progress Notes (Signed)
PHARMACIST - PHYSICIAN COMMUNICATION  DR:   Karna Christmas   CONCERNING: IV to Oral Route Change Policy  RECOMMENDATION: This patient is receiving levofloxacin by the intravenous route.  Based on criteria approved by the Pharmacy and Therapeutics Committee, the intravenous medication(s) is/are being converted to the equivalent oral dose form(s).   DESCRIPTION: These criteria include: The patient is eating (either orally or via tube) and/or has been taking other orally administered medications for a least 24 hours The patient has no evidence of active gastrointestinal bleeding or impaired GI absorption (gastrectomy, short bowel, patient on TNA or NPO).  If you have questions about this conversion, please contact the Pharmacy Department  []   [phone] )  ( [phone] [x]   [phone] )  [facility] []   [phone] )  [facility] []   [phone] )  [facility] []   [phone] )    ( [phone], PharmD Pharmacy Resident  07/04/2021 2:32 PM

## 2021-07-04 NOTE — Plan of Care (Signed)
°  Problem: Coping: Goal: Ability to adjust to condition or change in health will improve Outcome: Progressing   Problem: Fluid Volume: Goal: Ability to maintain a balanced intake and output will improve Outcome: Progressing   Problem: Health Behavior/Discharge Planning: Goal: Ability to identify and utilize available resources and services will improve Outcome: Progressing Goal: Ability to manage health-related needs will improve Outcome: Progressing   Problem: Education: Goal: Knowledge of General Education information will improve Description: Including pain rating scale, medication(s)/side effects and non-pharmacologic comfort measures Outcome: Progressing   Problem: Activity: Goal: Risk for activity intolerance will decrease Outcome: Progressing   Problem: Pain Managment: Goal: General experience of comfort will improve Outcome: Progressing   Problem: Safety: Goal: Ability to remain free from injury will improve Outcome: Progressing   Problem: Skin Integrity: Goal: Risk for impaired skin integrity will decrease Outcome: Progressing

## 2021-07-04 NOTE — Consult Note (Signed)
PHARMACY CONSULT NOTE - FOLLOW UP  Pharmacy Consult for Electrolyte Monitoring and Replacement   Recent Labs: Potassium (mmol/L)  Date Value  07/04/2021 3.3 (L)   Magnesium (mg/dL)  Date Value  85/63/1497 2.0   Calcium (mg/dL)  Date Value  02/63/7858 8.6 (L)   Albumin (g/dL)  Date Value  85/08/7739 5.0   Phosphorus (mg/dL)  Date Value  28/78/6767 2.1 (L)   Sodium (mmol/L)  Date Value  07/04/2021 138     Assessment: 20 y.o male  with significant PMH of type 1 diabetes mellitus, celiac disease, seizure disorder, polysubstance abuse (EtOH abuse, cocaine and amphetamine) who presented to the ED with hyperglycemia and concerns for DKA.  Labs Na 136>138 K 4.0>3.3 Phos 2.1 Mg 2.0  Goal of Therapy:  WNL  Plan:  --Orders placed for Kcl PO x1 and 500mg  K phos neutral q4h x2 doses  --No other replacement is currently indicated --Continue to monitor electrolytes and replace as clinically indicated   , PharmD Pharmacy Resident  07/04/2021 1:59 PM

## 2021-07-05 DIAGNOSIS — E1165 Type 2 diabetes mellitus with hyperglycemia: Secondary | ICD-10-CM

## 2021-07-05 LAB — CBC
HCT: 37 % — ABNORMAL LOW (ref 39.0–52.0)
Hemoglobin: 13.6 g/dL (ref 13.0–17.0)
MCH: 35.1 pg — ABNORMAL HIGH (ref 26.0–34.0)
MCHC: 36.8 g/dL — ABNORMAL HIGH (ref 30.0–36.0)
MCV: 95.4 fL (ref 80.0–100.0)
Platelets: 214 10*3/uL (ref 150–400)
RBC: 3.88 MIL/uL — ABNORMAL LOW (ref 4.22–5.81)
RDW: 11.3 % — ABNORMAL LOW (ref 11.5–15.5)
WBC: 4 10*3/uL (ref 4.0–10.5)
nRBC: 0 % (ref 0.0–0.2)

## 2021-07-05 LAB — BASIC METABOLIC PANEL
Anion gap: 8 (ref 5–15)
BUN: 14 mg/dL (ref 6–20)
CO2: 26 mmol/L (ref 22–32)
Calcium: 8.5 mg/dL — ABNORMAL LOW (ref 8.9–10.3)
Chloride: 106 mmol/L (ref 98–111)
Creatinine, Ser: 0.34 mg/dL — ABNORMAL LOW (ref 0.61–1.24)
GFR, Estimated: >60 mL/min (ref >60)
Glucose, Bld: 209 mg/dL — ABNORMAL HIGH (ref 70–99)
Potassium: 3.4 mmol/L — ABNORMAL LOW (ref 3.5–5.1)
Sodium: 140 mmol/L (ref 135–145)

## 2021-07-05 LAB — GLUCOSE, CAPILLARY
Glucose-Capillary: 206 mg/dL — ABNORMAL HIGH (ref 70–99)
Glucose-Capillary: 211 mg/dL — ABNORMAL HIGH (ref 70–99)
Glucose-Capillary: 234 mg/dL — ABNORMAL HIGH (ref 70–99)

## 2021-07-05 LAB — PHOSPHORUS: Phosphorus: 3.8 mg/dL (ref 2.5–4.6)

## 2021-07-05 LAB — MAGNESIUM: Magnesium: 2.1 mg/dL (ref 1.7–2.4)

## 2021-07-05 MED ORDER — LEVETIRACETAM 500 MG PO TABS: 500.0000 mg | ORAL_TABLET | Freq: Two times a day (BID) | ORAL | 0 refills | Status: AC

## 2021-07-05 MED ORDER — POTASSIUM CHLORIDE CRYS ER 20 MEQ PO TBCR
40.0000 meq | EXTENDED_RELEASE_TABLET | Freq: Two times a day (BID) | ORAL | Status: DC
  Administered 2021-07-05: 11:00:00 40 meq via ORAL
  Filled 2021-07-05: qty 2

## 2021-07-05 MED ORDER — INSULIN ASPART 100 UNIT/ML IJ SOLN
6.0000 [IU] | Freq: Three times a day (TID) | INTRAMUSCULAR | Status: DC
  Administered 2021-07-05: 12:00:00 6 [IU] via SUBCUTANEOUS
  Filled 2021-07-05: qty 1

## 2021-07-05 NOTE — TOC Initial Note (Signed)
Transition of Care [facility]) - Initial/Assessment Note    Patient Details  Name: Jesus Ewing MRN: [id number] Date of Birth: 28-Sep-2000  Transition of Care [facility]) CM/SW Contact:    Caryn Section, RN Phone Number: 07/05/2021, 12:15 PM  Clinical Narrative:     Patient lives at home alone, he states that he feels safe and comfortable returning home at discharge.  Declines needs at this time.  Substance use resources given to patient.              Expected Discharge Plan: Home/Self Care     Patient Goals and CMS Choice     Choice offered to / list presented to : NA  Expected Discharge Plan and Services Expected Discharge Plan: Home/Self Care   Discharge Planning Services: CM Consult Post Acute Care Choice: NA Living arrangements for the past 2 months: Single Family Home Expected Discharge Date: 07/05/21                                    Prior Living Arrangements/Services Living arrangements for the past 2 months: Single Family Home Lives with:: Self Patient language and need for interpreter reviewed:: Yes (No interpreter required) Do you feel safe going back to the place where you live?: Yes      Need for Family Participation in Patient Care: Yes (Comment) Care giver support system in place?: Yes (comment)   Criminal Activity/Legal Involvement Pertinent to Current Situation/Hospitalization: No - Comment as needed  Activities of Daily Living Home Assistive Devices/Equipment: CBG Meter ADL Screening (condition at time of admission) Patient's cognitive ability adequate to safely complete daily activities?: Yes Is the patient deaf or have difficulty hearing?: No Does the patient have difficulty seeing, even when wearing glasses/contacts?: No Does the patient have difficulty concentrating, remembering, or making decisions?: No Patient able to express need for assistance with ADLs?: No Does the patient have difficulty dressing or bathing?: No Independently  performs ADLs?: Yes (appropriate for developmental age) Does the patient have difficulty walking or climbing stairs?: No Weakness of Legs: None Weakness of Arms/Hands: None  Permission Sought/Granted Permission sought to share information with : Case Manager Permission granted to share information with : Yes, Verbal Permission Granted              Emotional Assessment Appearance:: Appears stated age Attitude/Demeanor/Rapport: Lethargic Affect (typically observed): Stable Orientation: : Oriented to Self, Oriented to Place, Oriented to  Time, Oriented to Situation Alcohol / Substance Use: Not Applicable Psych Involvement: No (comment)  Admission diagnosis:  Cough [R05.9] AKI (acute kidney injury) (HCC) [N17.9] Diabetic ketoacidosis without coma associated with type 1 diabetes mellitus (HCC) [E10.10] Severe hyperglycemia due to diabetes mellitus (HCC) [E11.65] Patient Active Problem List   Diagnosis Date Noted   Severe hyperglycemia due to diabetes mellitus (HCC) 07/03/2021   DKA (diabetic ketoacidosis) (HCC) 02/14/2021   Seizure (HCC) 02/14/2021   AKI (acute kidney injury) (HCC) 02/14/2021   Diabetes mellitus without complication (HCC) 02/14/2021   Overdose of trazodone 02/14/2021   Depression 02/14/2021   DKA (diabetic ketoacidoses) 05/26/2019   DKA, type 1 (HCC) 06/19/2016   Celiac disease 06/19/2016   PCP:  Center, [facility]:   [phone] [place], [place] - [address] [place] [address] Phone: [phone] Fax: [phone]  [facility] [address] [place] - [address] [place]  [address] Phone: [phone] Fax: [phone]     Social Determinants of Health (SDOH) Interventions    Readmission Risk Interventions No flowsheet data found.

## 2021-07-05 NOTE — Progress Notes (Addendum)
Inpatient Diabetes Program Recommendations  AACE/ADA: New Consensus Statement on Inpatient Glycemic Control   Target Ranges:  Prepandial:   less than 140 mg/dL      Peak postprandial:   less than 180 mg/dL (1-2 hours)      Critically ill patients:  140 - 180 mg/dL    Latest Reference Range & Units 07/04/21 07:33 07/04/21 11:18 07/04/21 15:17 07/04/21 19:51 07/04/21 22:08 07/04/21 23:50 07/05/21 04:51  Glucose-Capillary 70 - 99 mg/dL 580 (H) 998 (H) 338 (H) 286 (H) 405 (H) 331 (H) 206 (H)   Review of Glycemic Control  Diabetes history: DM1 Outpatient Diabetes medications: Lantus 50 units QHS, Humalog (1 unit for 8 grams of carbs, 1 unit drops glucose 25 mg/dl) Current orders for Inpatient glycemic control: Semglee 40 units daily, Novolog 0-9 units TID with meals, Novolog 0-5 units QHS, Novolog 4 units TID with meals   Inpatient Diabetes Program Recommendations:    Insulin: Please consider increasing Semglee to 44 units daily and meal coverage to Novolog 8 units TID with meals.  Addendum 07/05/21@10 :55-Dr. Nelson Chimes asked that I provide FreeStyle Libre2 sensor samples to patient for him to use until he is able to get Dexcom supplies refilled. Patient states he has never used the FreeStyle Libre2 CGM. Explained how the [employer] works, discussed it has to be applied to back of upper arms only, discussed using phone app to read sensor. Patient downloaded FreeStyle [place] app on his phone. Assisted patient to apply FreeStyle Libre2 sensor to back of upper right arm. Patient is aware that there is a one hour warm up period before it will provide a glucose value. Provided patient to 2 additional FreeStyle Libre2 sensors and explained that each sensor is good for 14 days.  Patient plans to reach out to his Endocrinologist about getting Dexcom sensors and transmitters refilled and to make follow up appointment. Patient verbalized understanding of information discussed and he states that he has no  questions at this time.  Thanks, Jesus Penner, RN, MSN, CDE Diabetes Coordinator Inpatient Diabetes Program [phone] (Team Pager from 8am to 5pm)

## 2021-07-05 NOTE — Discharge Summary (Signed)
Physician Discharge Summary  Jesus Ewing IAX:[id number] DOB: [birth year] DOA: 07/03/2021  PCP: [facility]  Admit date: 07/03/2021 Discharge date: 07/05/2021  Admitted From: Home Disposition: Home  Recommendations for Outpatient Follow-up:  Follow up with PCP in 1-2 weeks Follow-up with endocrinology Please obtain BMP/CBC in one week Please follow up on the following pending results: None  Home Health: No Equipment/Devices: None Discharge Condition: Stable CODE STATUS: Full Diet recommendation: Heart Healthy / Carb Modified   Brief/Interim Summary: 20 y.o male  with significant PMH of type 1 diabetes mellitus, celiac disease, seizure disorder, polysubstance abuse (EtOH abuse, cocaine and amphetamine) who presented to the ED with hyperglycemia and concerns for DKA.  He was experiencing some upper respiratory symptoms with cough and congestion for the past 2 days. Admitted for diabetic ketoacidosis, initially started on insulin infusion with Endo tool and then later transitioned to long-acting and basal. Patient improved to baseline.  He was discharged home on his current regimen with advised to follow-up with his endocrinologist.  We also provided him with continuous glucose monitor. Will he will get benefit from CGM and insulin pump. His endocrinologist can determine an order accordingly.  Discharge Diagnoses:  Principal Problem:   Severe hyperglycemia due to diabetes mellitus [facility])   Discharge Instructions  Discharge Instructions     Diet - low sodium heart healthy   Complete by: As directed    Discharge instructions   Complete by: As directed    It was pleasure taking care of you. Continue using your insulin as you are using it before. Please monitor your blood glucose level very carefully and inject Humalog accordingly. Please follow-up with your primary care doctor or endocrinologist very closely, see if you can get a continuous glucose  monitor and insulin pump that will help controlling your blood glucose level. Keep yourself well-hydrated and stay away from alcohol and other drugs.   Increase activity slowly   Complete by: As directed       Allergies as of 07/05/2021   No Known Allergies      Medication List     TAKE these medications    insulin lispro 100 UNIT/ML KwikPen Commonly known as: HUMALOG Inject 0-50 Units into the skin as directed.   Lantus SoloStar 100 UNIT/ML Solostar Pen Generic drug: insulin glargine Inject 50 Units into the skin at bedtime.   levETIRAcetam 500 MG tablet Commonly known as: KEPPRA Take 1 tablet (500 mg total) by mouth 2 (two) times daily.        Follow-up Information     Center, [facility]. Schedule an appointment as soon as possible for a visit in 1 week(s).   Contact information: [address] [facility] [address] [facility] [place] [address] [phone]                No Known Allergies  Consultations: PCCM  Procedures/Studies: DG Chest 1 View  Result Date: 07/03/2021 CLINICAL DATA:  Hyperglycemia EXAM: CHEST  1 VIEW COMPARISON:  05/26/2019 FINDINGS: Normal heart size and mediastinal contours. No acute infiltrate or edema. No effusion or pneumothorax. No acute osseous findings. Artifact from EKG leads IMPRESSION: No evidence of active disease. Electronically Signed   By: Tiburcio Pea M.D.   On: 07/03/2021 04:19    Subjective: Patient was seen and examined today.  No new complaints.  We discussed about getting a CGM and insulin pump as that will help to prevent further hospitalizations with DKA. Patient will discuss with his endocrinologist.  He used to have CGM before.  Discharge Exam: Vitals:   07/05/21 0448 07/05/21 0731  BP: 126/76 (!) 105/51  Pulse: 92 74  Resp: 18 18  Temp: 97.9 F (36.6 C) 97.7 F (36.5 C)  SpO2: 99% 100%   Vitals:   07/04/21 2346 07/05/21 0448 07/05/21 0500 07/05/21 0731  BP: 126/74 126/76  (!) 105/51   Pulse: 99 92  74  Resp: 20 18  18   Temp: 98.2 F (36.8 C) 97.9 F (36.6 C)  97.7 F (36.5 C)  TempSrc: Oral Oral    SpO2: 100% 99%  100%  Weight:   69.4 kg   Height:        General: Pt is alert, awake, not in acute distress Cardiovascular: RRR, S1/S2 +, no rubs, no gallops Respiratory: CTA bilaterally, no wheezing, no rhonchi Abdominal: Soft, NT, ND, bowel sounds + Extremities: no edema, no cyanosis   The results of significant diagnostics from this hospitalization (including imaging, microbiology, ancillary and laboratory) are listed below for reference.    Microbiology: Recent Results (from the past 240 hour(s))  Resp Panel by RT-PCR (Flu A&B, Covid) Nasopharyngeal Swab     Status: None   Collection Time: 07/03/21  3:57 AM   Specimen: Nasopharyngeal Swab; Nasopharyngeal(NP) swabs in vial transport medium  Result Value Ref Range Status   SARS Coronavirus 2 by RT PCR NEGATIVE NEGATIVE Final    Comment: (NOTE) SARS-CoV-2 target nucleic acids are NOT DETECTED.  The SARS-CoV-2 RNA is generally detectable in upper respiratory specimens during the acute phase of infection. The lowest concentration of SARS-CoV-2 viral copies this assay can detect is 138 copies/mL. A negative result does not preclude SARS-Cov-2 infection and should not be used as the sole basis for treatment or other patient management decisions. A negative result may occur with  improper specimen collection/handling, submission of specimen other than nasopharyngeal swab, presence of viral mutation(s) within the areas targeted by this assay, and inadequate number of viral copies(<138 copies/mL). A negative result must be combined with clinical observations, patient history, and epidemiological information. The expected result is Negative.  Fact Sheet for Patients:  07/05/21  Fact Sheet for Healthcare Providers:  BloggerCourse.com  This test is no t  yet approved or cleared by the [place] FDA and  has been authorized for detection and/or diagnosis of SARS-CoV-2 by FDA under an Emergency Use Authorization (EUA). This EUA will remain  in effect (meaning this test can be used) for the duration of the COVID-19 declaration under Section 564(b)(1) of the Act, 21 U.S.C.section 360bbb-3(b)(1), unless the authorization is terminated  or revoked sooner.       Influenza A by PCR NEGATIVE NEGATIVE Final   Influenza B by PCR NEGATIVE NEGATIVE Final    Comment: (NOTE) The Xpert Xpress SARS-CoV-2/FLU/RSV plus assay is intended as an aid in the diagnosis of influenza from Nasopharyngeal swab specimens and should not be used as a sole basis for treatment. Nasal washings and aspirates are unacceptable for Xpert Xpress SARS-CoV-2/FLU/RSV testing.  Fact Sheet for Patients: [place]  Fact Sheet for Healthcare Providers: BloggerCourse.com  This test is not yet approved or cleared by the [place] FDA and has been authorized for detection and/or diagnosis of SARS-CoV-2 by FDA under an Emergency Use Authorization (EUA). This EUA will remain in effect (meaning this test can be used) for the duration of the COVID-19 declaration under Section 564(b)(1) of the Act, 21 U.S.C. section 360bbb-3(b)(1), unless the authorization is terminated or revoked.  Performed  at [facility], [address] [place], [place] [address]   Urine Culture     Status: None   Collection Time: 07/03/21  4:44 AM   Specimen: Urine, Random  Result Value Ref Range Status   Specimen Description   Final    URINE, RANDOM Performed at [facility], [address], [place], [place] [address]    Special Requests   Final    NONE Performed at [facility], [address], [place], [place] [address]    Culture   Final    NO GROWTH Performed at [facility], [address] [place], [place] [address]    Report Status 07/04/2021 FINAL  Final  Culture, blood (routine x 2)     Status: None (Preliminary result)   Collection Time: 07/03/21  6:33 AM   Specimen: BLOOD  Result Value Ref Range Status   Specimen Description BLOOD BLOOD RIGHT HAND  Final   Special Requests   Final    BOTTLES DRAWN AEROBIC AND ANAEROBIC Blood Culture results may not be optimal due to an inadequate volume of blood received in culture bottles   Culture   Final    NO GROWTH 2 DAYS Performed at [facility], [address], [place], [place] [address]    Report Status PENDING  Incomplete  Culture, blood (routine x 2)     Status: None (Preliminary result)   Collection Time: 07/03/21  6:33 AM   Specimen: BLOOD  Result Value Ref Range Status   Specimen Description BLOOD RIGHT ANTECUBITAL  Final   Special Requests   Final    BOTTLES DRAWN AEROBIC AND ANAEROBIC Blood Culture results may not be optimal due to an inadequate volume of blood received in culture bottles   Culture   Final    NO GROWTH 2 DAYS Performed at [facility], [address] [place], [place] [address]    Report Status PENDING  Incomplete  MRSA Next Gen by PCR, Nasal     Status: None   Collection Time: 07/03/21  9:21 PM   Specimen: Nasal Mucosa; Nasal Swab  Result Value Ref Range Status   MRSA by PCR Next Gen NOT DETECTED NOT DETECTED Final    Comment: (NOTE) The GeneXpert MRSA Assay (FDA approved for NASAL specimens only), is one component of a comprehensive MRSA colonization surveillance program. It is not intended to diagnose MRSA infection nor to guide or monitor treatment for MRSA infections. Test performance is not FDA approved in patients less than 84 years old. Performed at [facility], [address] [place], [place] [address]      Labs: BNP (last 3 results) No results for input(s): BNP in the last 8760 hours. Basic Metabolic Panel: Recent Labs  Lab 07/03/21 0633  07/03/21 1002 07/03/21 1313 07/04/21 0335 07/05/21 0515  NA 139 136 136 138 140  K 5.0 4.3 4.0 3.3* 3.4*  CL 107 110 109 109 106  CO2 <7* 8* 12* 20* 26  GLUCOSE 364* 179* 142* 151* 209*  BUN 22* CREATININE 1.10 0.84 0.76 0.56* 0.34*  CALCIUM 9.0 8.2* 8.2* 8.6* 8.5*  MG 2.4  --   --  2.0 2.1  PHOS 5.6*  --   --  2.1* 3.8   Liver Function Tests: Recent Labs  Lab 07/03/21 0357  AST 76*  ALT 81*  ALKPHOS 175*  BILITOT 3.1*  PROT 9.3*  ALBUMIN 5.0   Recent Labs  Lab 07/03/21 0633  LIPASE 76*   No results for input(s): AMMONIA in the last 168 hours. CBC: Recent Labs  Lab 07/03/21 0357 07/03/21 0633 07/05/21 0515  WBC 16.6* 16.9* 4.0  NEUTROABS 12.8*  --   --   HGB 17.8* 16.1 13.6  HCT 52.5* 46.7 37.0*  MCV 102.7* 100.9* 95.4  PLT 451* 338 214   Cardiac Enzymes: No results for input(s): CKTOTAL, CKMB, CKMBINDEX, TROPONINI in the last 168 hours. BNP: Invalid input(s): POCBNP CBG: Recent Labs  Lab 07/04/21 1951 07/04/21 2208 07/04/21 2350 07/05/21 0451 07/05/21 0732  GLUCAP 286* 405* 331* 206* 211*   D-Dimer No results for input(s): DDIMER in the last 72 hours. Hgb A1c Recent Labs    07/03/21 0610 07/03/21 0633  HGBA1C 10.3* 10.0*   Lipid Profile No results for input(s): CHOL, HDL, LDLCALC, TRIG, CHOLHDL, LDLDIRECT in the last 72 hours. Thyroid function studies No results for input(s): TSH, T4TOTAL, T3FREE, THYROIDAB in the last 72 hours.  Invalid input(s): FREET3 Anemia work up No results for input(s): VITAMINB12, FOLATE, FERRITIN, TIBC, IRON, RETICCTPCT in the last 72 hours. Urinalysis    Component Value Date/Time   COLORURINE STRAW (A) 07/03/2021 0444   APPEARANCEUR CLEAR (A) 07/03/2021 0444   LABSPEC 1.021 07/03/2021 0444   PHURINE 5.0 07/03/2021 0444   GLUCOSEU >=500 (A) 07/03/2021 0444   HGBUR SMALL (A) 07/03/2021 0444   BILIRUBINUR NEGATIVE 07/03/2021 0444   KETONESUR 80 (A) 07/03/2021 0444   PROTEINUR 30 (A)  07/03/2021 0444   NITRITE NEGATIVE 07/03/2021 0444   LEUKOCYTESUR NEGATIVE 07/03/2021 0444   Sepsis Labs Invalid input(s): PROCALCITONIN,  WBC,  LACTICIDVEN Microbiology Recent Results (from the past 240 hour(s))  Resp Panel by RT-PCR (Flu A&B, Covid) Nasopharyngeal Swab     Status: None   Collection Time: 07/03/21  3:57 AM   Specimen: Nasopharyngeal Swab; Nasopharyngeal(NP) swabs in vial transport medium  Result Value Ref Range Status   SARS Coronavirus 2 by RT PCR NEGATIVE NEGATIVE Final    Comment: (NOTE) SARS-CoV-2 target nucleic acids are NOT DETECTED.  The SARS-CoV-2 RNA is generally detectable in upper respiratory specimens during the acute phase of infection. The lowest concentration of SARS-CoV-2 viral copies this assay can detect is 138 copies/mL. A negative result does not preclude SARS-Cov-2 infection and should not be used as the sole basis for treatment or other patient management decisions. A negative result may occur with  improper specimen collection/handling, submission of specimen other than nasopharyngeal swab, presence of viral mutation(s) within the areas targeted by this assay, and inadequate number of viral copies(<138 copies/mL). A negative result must be combined with clinical observations, patient history, and epidemiological information. The expected result is Negative.  Fact Sheet for Patients:  BloggerCourse.com  Fact Sheet for Healthcare Providers:  SeriousBroker.it  This test is no t yet approved or cleared by the [place] FDA and  has been authorized for detection and/or diagnosis of SARS-CoV-2 by FDA under an Emergency Use Authorization (EUA). This EUA will remain  in effect (meaning this test can be used) for the duration of the COVID-19 declaration under Section 564(b)(1) of the Act, 21 U.S.C.section 360bbb-3(b)(1), unless the authorization is terminated  or revoked sooner.        Influenza A by PCR NEGATIVE NEGATIVE Final   Influenza B by PCR NEGATIVE NEGATIVE Final    Comment: (NOTE) The Xpert Xpress SARS-CoV-2/FLU/RSV plus assay is intended as an aid in the diagnosis of influenza from Nasopharyngeal swab specimens and should  not be used as a sole basis for treatment. Nasal washings and aspirates are unacceptable for Xpert Xpress SARS-CoV-2/FLU/RSV testing.  Fact Sheet for Patients: BloggerCourse.com  Fact Sheet for Healthcare Providers: SeriousBroker.it  This test is not yet approved or cleared by the [place] FDA and has been authorized for detection and/or diagnosis of SARS-CoV-2 by FDA under an Emergency Use Authorization (EUA). This EUA will remain in effect (meaning this test can be used) for the duration of the COVID-19 declaration under Section 564(b)(1) of the Act, 21 U.S.C. section 360bbb-3(b)(1), unless the authorization is terminated or revoked.  Performed at [facility], [address] [place], [place] [address]   Urine Culture     Status: None   Collection Time: 07/03/21  4:44 AM   Specimen: Urine, Random  Result Value Ref Range Status   Specimen Description   Final    URINE, RANDOM Performed at [facility], [address], [place], [place] [address]    Special Requests   Final    NONE Performed at [facility], [address], [place], [place] [address]    Culture   Final    NO GROWTH Performed at [facility], [address] [place], [place] [address]    Report Status 07/04/2021 FINAL  Final  Culture, blood (routine x 2)     Status: None (Preliminary result)   Collection Time: 07/03/21  6:33 AM   Specimen: BLOOD  Result Value Ref Range Status   Specimen Description BLOOD BLOOD RIGHT HAND  Final   Special Requests   Final    BOTTLES DRAWN AEROBIC AND ANAEROBIC Blood Culture results may not be optimal due to an inadequate volume of  blood received in culture bottles   Culture   Final    NO GROWTH 2 DAYS Performed at [facility], [address] [place], [place] [address]    Report Status PENDING  Incomplete  Culture, blood (routine x 2)     Status: None (Preliminary result)   Collection Time: 07/03/21  6:33 AM   Specimen: BLOOD  Result Value Ref Range Status   Specimen Description BLOOD RIGHT ANTECUBITAL  Final   Special Requests   Final    BOTTLES DRAWN AEROBIC AND ANAEROBIC Blood Culture results may not be optimal due to an inadequate volume of blood received in culture bottles   Culture   Final    NO GROWTH 2 DAYS Performed at [facility], [address], [place], [place] [address]    Report Status PENDING  Incomplete  MRSA Next Gen by PCR, Nasal     Status: None   Collection Time: 07/03/21  9:21 PM   Specimen: Nasal Mucosa; Nasal Swab  Result Value Ref Range Status   MRSA by PCR Next Gen NOT DETECTED NOT DETECTED Final    Comment: (NOTE) The GeneXpert MRSA Assay (FDA approved for NASAL specimens only), is one component of a comprehensive MRSA colonization surveillance program. It is not intended to diagnose MRSA infection nor to guide or monitor treatment for MRSA infections. Test performance is not FDA approved in patients less than 4 years old. Performed at [facility], [address] [place], [place] [address]     Time coordinating discharge: Over 30 minutes  SIGNED:  Arnetha Courser, MD  [facility] 07/05/2021, 11:24 AM  If 7PM-7AM, please contact night-coverage [email or web address]  This record has been created using Conservation officer, historic buildings. Errors have been sought and corrected,but  may not always be located. Such creation errors do not reflect on the standard of care.

## 2021-07-05 NOTE — Consult Note (Signed)
PHARMACY CONSULT NOTE - FOLLOW UP  Pharmacy Consult for Electrolyte Monitoring and Replacement   Recent Labs: Potassium (mmol/L)  Date Value  07/05/2021 3.4 (L)   Magnesium (mg/dL)  Date Value  25/63/8937 2.1   Calcium (mg/dL)  Date Value  34/28/7681 8.5 (L)   Albumin (g/dL)  Date Value  15/72/6203 5.0   Phosphorus (mg/dL)  Date Value  55/97/4163 3.8   Sodium (mmol/L)  Date Value  07/05/2021 140     Assessment: 20 y.o male  with significant PMH of type 1 diabetes mellitus, celiac disease, seizure disorder, polysubstance abuse (EtOH abuse, cocaine and amphetamine) who presented to the ED with hyperglycemia and concerns for DKA. Off insulin transitioned to SQ insulin.    Goal of Therapy:  WNL  Plan:  Will give Kcl 40 mEq x 2 PO.  F/u with AM labs   Ronnald Ramp, PharmD 07/05/2021 8:39 AM

## 2021-07-08 LAB — CULTURE, BLOOD (ROUTINE X 2)
Culture: NO GROWTH
Culture: NO GROWTH

## 2021-10-31 ENCOUNTER — Emergency Department: Payer: 59

## 2021-10-31 ENCOUNTER — Encounter: Payer: Self-pay | Admitting: *Deleted

## 2021-10-31 ENCOUNTER — Other Ambulatory Visit: Payer: Self-pay

## 2021-10-31 ENCOUNTER — Inpatient Hospital Stay
Admission: EM | Admit: 2021-10-31 | Discharge: 2021-11-01 | DRG: 637 | Disposition: A | Discharge status: Home / Self Care | Payer: 59 | Attending: Internal Medicine | Admitting: Internal Medicine

## 2021-10-31 DIAGNOSIS — K8681 Exocrine pancreatic insufficiency: Secondary | ICD-10-CM | POA: Diagnosis present

## 2021-10-31 DIAGNOSIS — E876 Hypokalemia: Secondary | ICD-10-CM | POA: Diagnosis not present

## 2021-10-31 DIAGNOSIS — E1010 Type 1 diabetes mellitus with ketoacidosis without coma: Secondary | ICD-10-CM | POA: Diagnosis present

## 2021-10-31 DIAGNOSIS — Z794 Long term (current) use of insulin: Secondary | ICD-10-CM

## 2021-10-31 DIAGNOSIS — N179 Acute kidney failure, unspecified: Secondary | ICD-10-CM | POA: Diagnosis present

## 2021-10-31 DIAGNOSIS — E875 Hyperkalemia: Secondary | ICD-10-CM | POA: Diagnosis present

## 2021-10-31 DIAGNOSIS — G9341 Metabolic encephalopathy: Secondary | ICD-10-CM | POA: Diagnosis present

## 2021-10-31 DIAGNOSIS — K900 Celiac disease: Secondary | ICD-10-CM | POA: Diagnosis present

## 2021-10-31 DIAGNOSIS — Z79899 Other long term (current) drug therapy: Secondary | ICD-10-CM

## 2021-10-31 DIAGNOSIS — E1110 Type 2 diabetes mellitus with ketoacidosis without coma: Secondary | ICD-10-CM | POA: Diagnosis present

## 2021-10-31 DIAGNOSIS — G40909 Epilepsy, unspecified, not intractable, without status epilepticus: Secondary | ICD-10-CM

## 2021-10-31 DIAGNOSIS — R569 Unspecified convulsions: Secondary | ICD-10-CM | POA: Diagnosis present

## 2021-10-31 LAB — BLOOD GAS, VENOUS
Acid-base deficit: 24.1 mmol/L — ABNORMAL HIGH (ref 0.0–2.0)
Bicarbonate: 5.6 mmol/L — ABNORMAL LOW (ref 20.0–28.0)
O2 Saturation: 72.4 %
Patient temperature: 37
pCO2, Ven: 22 mmHg — ABNORMAL LOW (ref 44–60)
pH, Ven: 7.01 — CL (ref 7.25–7.43)
pO2, Ven: 50 mmHg — ABNORMAL HIGH (ref 32–45)

## 2021-10-31 LAB — BASIC METABOLIC PANEL
BUN: 17 mg/dL (ref 6–20)
CO2: <7 mmol/L — ABNORMAL LOW (ref 22–32)
Calcium: 8.6 mg/dL — ABNORMAL LOW (ref 8.9–10.3)
Chloride: 96 mmol/L — ABNORMAL LOW (ref 98–111)
Creatinine, Ser: 1.3 mg/dL — ABNORMAL HIGH (ref 0.61–1.24)
GFR, Estimated: >60 mL/min (ref >60)
Glucose, Bld: 550 mg/dL (ref 70–99)
Potassium: 6.1 mmol/L — ABNORMAL HIGH (ref 3.5–5.1)
Sodium: 131 mmol/L — ABNORMAL LOW (ref 135–145)

## 2021-10-31 LAB — CBC
HCT: 50.9 % (ref 39.0–52.0)
Hemoglobin: 16.9 g/dL (ref 13.0–17.0)
MCH: 33.6 pg (ref 26.0–34.0)
MCHC: 33.2 g/dL (ref 30.0–36.0)
MCV: 101.2 fL — ABNORMAL HIGH (ref 80.0–100.0)
Platelets: 563 10*3/uL — ABNORMAL HIGH (ref 150–400)
RBC: 5.03 MIL/uL (ref 4.22–5.81)
RDW: 12.1 % (ref 11.5–15.5)
WBC: 12.4 10*3/uL — ABNORMAL HIGH (ref 4.0–10.5)
nRBC: 0 % (ref 0.0–0.2)

## 2021-10-31 LAB — CBG MONITORING, ED
Glucose-Capillary: 572 mg/dL (ref 70–99)
Glucose-Capillary: 445 mg/dL — ABNORMAL HIGH (ref 70–99)
Glucose-Capillary: 500 mg/dL — ABNORMAL HIGH (ref 70–99)

## 2021-10-31 IMAGING — DX DG CHEST 1V PORT
1 series · 1 of 1 positions shown · non-contrast
Comparison: Portable chest [DATE]

CLINICAL DATA: Shortness of breath with hyperglycemia.

EXAM:
PORTABLE CHEST 1 VIEW

[chest ap]
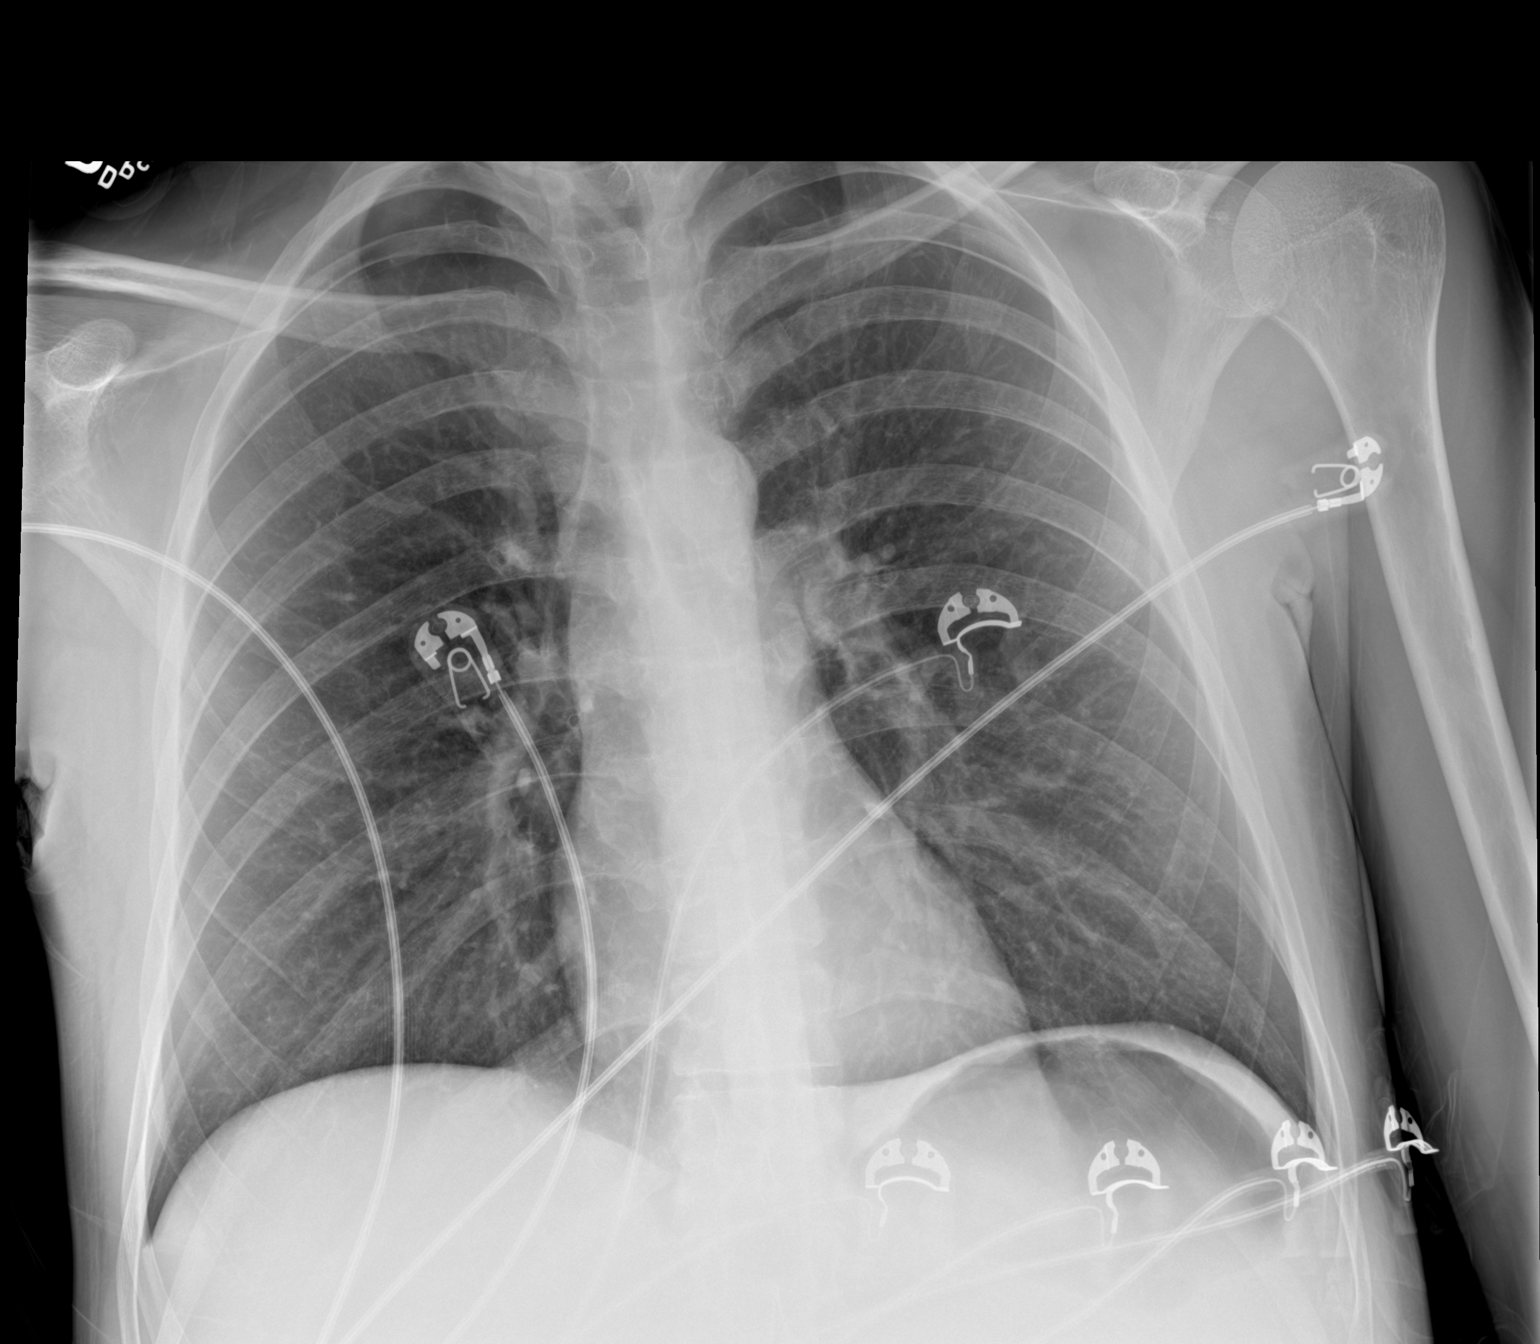

[1 of 1 positions shown; findings below may reference images not displayed]

FINDINGS: The heart size and mediastinal contours are within normal limits.
Both lungs are clear. The visualized skeletal structures are
unremarkable. There are multiple overlying monitor wires.
IMPRESSION: No evidence of acute chest disease or interval changes.

## 2021-10-31 MED ORDER — LACTATED RINGERS IV SOLN: INTRAVENOUS | Status: DC

## 2021-10-31 MED ORDER — ONDANSETRON HCL 40 MG/20ML IJ SOLN
8.0000 mg | Freq: Three times a day (TID) | INTRAMUSCULAR | Status: DC | PRN
Start: 2021-10-31 — End: 2021-11-01
  Administered 2021-11-01: 8 mg via INTRAVENOUS
  Filled 2021-10-31 (×2): qty 4

## 2021-10-31 MED ORDER — INSULIN REGULAR(HUMAN) IN NACL 100-0.9 UT/100ML-% IV SOLN
INTRAVENOUS | Status: DC
  Administered 2021-10-31: 10 [IU]/h via INTRAVENOUS
  Filled 2021-10-31: qty 100

## 2021-10-31 MED ORDER — HEPARIN SODIUM (PORCINE) 5000 UNIT/ML IJ SOLN
5000.0000 [IU] | Freq: Three times a day (TID) | INTRAMUSCULAR | Status: DC
  Administered 2021-11-01 (×2): 5000 [IU] via SUBCUTANEOUS
  Filled 2021-10-31 (×2): qty 1

## 2021-10-31 MED ORDER — INSULIN REGULAR(HUMAN) IN NACL 100-0.9 UT/100ML-% IV SOLN
INTRAVENOUS | Status: DC
  Administered 2021-10-31: 9 [IU]/h via INTRAVENOUS

## 2021-10-31 MED ORDER — POTASSIUM CHLORIDE 10 MEQ/100ML IV SOLN
10.0000 meq | INTRAVENOUS | Status: DC
  Filled 2021-10-31: qty 100

## 2021-10-31 MED ORDER — LEVETIRACETAM 500 MG PO TABS
500.0000 mg | ORAL_TABLET | Freq: Two times a day (BID) | ORAL | Status: DC
  Filled 2021-10-31 (×2): qty 1

## 2021-10-31 MED ORDER — SODIUM CHLORIDE 0.9 % IV BOLUS
2000.0000 mL | Freq: Once | INTRAVENOUS | Status: AC
  Administered 2021-10-31: 2000 mL via INTRAVENOUS

## 2021-10-31 MED ORDER — DEXTROSE 50 % IV SOLN: 0.0000 mL | INTRAVENOUS | Status: DC | PRN

## 2021-10-31 MED ORDER — LACTATED RINGERS IV BOLUS
20.0000 mL/kg | Freq: Once | INTRAVENOUS | Status: AC
  Administered 2021-10-31: 1388 mL via INTRAVENOUS

## 2021-10-31 MED ORDER — DEXTROSE IN LACTATED RINGERS 5 % IV SOLN: INTRAVENOUS | Status: DC

## 2021-10-31 MED ORDER — PANTOPRAZOLE SODIUM 40 MG IV SOLR
40.0000 mg | Freq: Two times a day (BID) | INTRAVENOUS | Status: DC
  Administered 2021-11-01 (×2): 40 mg via INTRAVENOUS
  Filled 2021-10-31 (×2): qty 10

## 2021-10-31 NOTE — ED Provider Notes (Signed)
? ?[facility] ?Provider Note ? ? ? Event Date/Time  ? First MD Initiated Contact with Patient 10/31/21 2233   ?  (approximate) ? ? ?History  ? ?Hyperglycemia and Shortness of Breath ? ? ?HPI ? ?Jesus Ewing is a 21 y.o. male with a history of type 1 diabetes presents to the ER with shortness of breath malaise and concern for DKA.  Patient states that his glucometer is not functioning.  States he been taking insulin but does not know what his blood sugars been has been having increasing urinary frequency feels very dehydrated very thirsty. ?  ? ? ?Physical Exam  ? ?Triage Vital Signs: ?ED Triage Vitals  ?Enc Vitals Group  ?   BP 10/31/21 2158 (!) 143/93  ?   Pulse Rate 10/31/21 2158 (!) 130  ?   Resp 10/31/21 2158 (!) 36  ?   Temp --   ?   Temp src --   ?   SpO2 10/31/21 2158 96 %  ?   Weight 10/31/21 2159 153 lb (69.4 kg)  ?   Height 10/31/21 2159 5\' 10"  (1.778 m)  ?   Head Circumference --   ?   Peak Flow --   ?   Pain Score 10/31/21 2159 0  ?   Pain Loc --   ?   Pain Edu? --   ?   Excl. in [place]? --   ? ? ?Most recent vital signs: ?Vitals:  ? 10/31/21 2158  ?BP: (!) 143/93  ?Pulse: (!) 130  ?Resp: (!) 36  ?SpO2: 96%  ? ? ? ?Constitutional: Alert but ill appearing with kusmaul respirations ?Eyes: Conjunctivae are normal.  ?Head: Atraumatic. ?Nose: No congestion/rhinnorhea. ?Mouth/Throat: Mucous membranes are dry ?Neck: Painless ROM.  ?Cardiovascular:  tachycardic, no m/g/r ?Respiratory: Tachypnea and coo small respirations ?Gastrointestinal: Soft and nontender.  ?Musculoskeletal:  no deformity ?Neurologic:  MAE spontaneously. No gross focal neurologic deficits are appreciated.  ?Skin:  Skin is warm, dry and intact. No rash noted. ?Psychiatric: Mood and affect are normal. Speech and behavior are normal. ? ? ? ?ED Results / Procedures / Treatments  ? ?Labs ?(all labs ordered are listed, but only abnormal results are displayed) ?Labs Reviewed  ?BASIC METABOLIC PANEL - Abnormal; Notable  for the following components:  ?    Result Value  ? Sodium 131 (*)   ? Potassium 6.1 (*)   ? Chloride 96 (*)   ? CO2 <7 (*)   ? Glucose, Bld 550 (*)   ? Creatinine, Ser 1.30 (*)   ? Calcium 8.6 (*)   ? All other components within normal limits  ?CBC - Abnormal; Notable for the following components:  ? WBC 12.4 (*)   ? MCV 101.2 (*)   ? Platelets 563 (*)   ? All other components within normal limits  ?BLOOD GAS, VENOUS - Abnormal; Notable for the following components:  ? pH, Ven 7.01 (*)   ? pCO2, Ven 22 (*)   ? pO2, Ven 50 (*)   ? Bicarbonate 5.6 (*)   ? Acid-base deficit 24.1 (*)   ? All other components within normal limits  ?CBG MONITORING, ED - Abnormal; Notable for the following components:  ? Glucose-Capillary 572 (*)   ? All other components within normal limits  ?CBG MONITORING, ED - Abnormal; Notable for the following components:  ? Glucose-Capillary 500 (*)   ? All other components within normal limits  ?URINALYSIS, ROUTINE W REFLEX MICROSCOPIC  ?BETA-HYDROXYBUTYRIC  ACID  ?BETA-HYDROXYBUTYRIC ACID  ?BASIC METABOLIC PANEL  ?BASIC METABOLIC PANEL  ?BASIC METABOLIC PANEL  ?COMPREHENSIVE METABOLIC PANEL  ? ? ? ?EKG ? ?ED ECG REPORT ?I, Merlyn Lot, the attending physician, personally viewed and interpreted this ECG. ? ? Date: 10/31/2021 ? EKG Time: 22:19 ? Rate: 120 ? Rhythm: sinus ? Axis: normal ? Intervals: normal ? ST&T Change: no stemi, no depression ? ? ? ?RADIOLOGY ?Please see ED Course for my review and interpretation. ? ?I personally reviewed all radiographic images ordered to evaluate for the above acute complaints and reviewed radiology reports and findings.  These findings were personally discussed with the patient.  Please see medical record for radiology report. ? ? ? ?PROCEDURES: ? ?Critical Care performed: Yes, see critical care procedure note(s) ? ?.Critical Care ?Performed by: Merlyn Lot, MD ?Authorized by: Merlyn Lot, MD  ? ?Critical care provider statement:  ?  Critical  care time (minutes):  35 ?  Critical care was necessary to treat or prevent imminent or life-threatening deterioration of the following conditions:  Metabolic crisis ?  Critical care was time spent personally by me on the following activities:  Ordering and performing treatments and interventions, ordering and review of laboratory studies, ordering and review of radiographic studies, pulse oximetry, re-evaluation of patient's condition, review of old charts, obtaining history from patient or surrogate, examination of patient, evaluation of patient's response to treatment, discussions with primary provider, discussions with consultants and development of treatment plan with patient or surrogate ? ? ?MEDICATIONS ORDERED IN ED: ?Medications  ?lactated ringers bolus 1,388 mL (has no administration in time range)  ?lactated ringers infusion (has no administration in time range)  ?dextrose 5 % in lactated ringers infusion (0 mLs Intravenous Hold 10/31/21 2318)  ?dextrose 50 % solution 0-50 mL (has no administration in time range)  ?insulin regular, human (MYXREDLIN) 100 units/ 100 mL infusion (has no administration in time range)  ?lactated ringers infusion (has no administration in time range)  ?dextrose 5 % in lactated ringers infusion (has no administration in time range)  ?heparin injection 5,000 Units (has no administration in time range)  ?potassium chloride 10 mEq in 100 mL IVPB (has no administration in time range)  ?dextrose 50 % solution 0-50 mL (has no administration in time range)  ?levETIRAcetam (KEPPRA) tablet 500 mg (has no administration in time range)  ?sodium chloride 0.9 % bolus 2,000 mL (2,000 mLs Intravenous New Bag/Given 10/31/21 2244)  ? ? ? ?IMPRESSION / MDM / ASSESSMENT AND PLAN / ED COURSE  ?I reviewed the triage vital signs and the nursing notes. ?             ?               ? ?Differential diagnosis includes, but is not limited to, DKA, dehydration, sepsis, electrolyte abnormality, HHS,  hyper/glycemic crisis ? ?Patient presented to the ER clinically appearing in DKA.  Hyperglycemic.  Will obtain IV access and give IV fluid.  Chest x-ray by my review and interpretation does not show any evidence of pneumothorax or infiltrate. ? ? ?Clinical Course as of 10/31/21 2323  ?Mon Oct 31, 2021  ?2257 Patient receiving 2 L bolus IV fluid insulin infusion has been ordered.  VBG with profound metabolic acidosis with respiratory compensation.  Mild bump in his creatinine.  Hyperglycemia and presentation consistent with DKA.  Patient is alert and following commands but does seem somewhat intoxicated consistent with metabolic encephalopathy and altered mental status.  He is  protecting his airway.  Do feel the patient should be admitted to intensive care unit based on his acuity at this time. [PR]  ?2313 Patient value by ICU felt to be appropriate for hospitalist admission.  Case discussed in consultation with hospitalist to decrease admit patient to stepdown bed. [PR]  ?  ?Clinical Course User Index ?[PR] Merlyn Lot, MD  ? ? ? ?FINAL CLINICAL IMPRESSION(S) / ED DIAGNOSES  ? ?Final diagnoses:  ?Diabetic ketoacidosis without coma associated with type 1 diabetes mellitus ([place])  ? ? ? ?Rx / DC Orders  ? ?ED Discharge Orders   ? ? None  ? ?  ? ? ? ?Note:  This document was prepared using Dragon voice recognition software and may include unintentional dictation errors. ? ?  ?Merlyn Lot, MD ?10/31/21 2323 ? ?

## 2021-10-31 NOTE — H&P (Signed)
?History and Physical  ? ? ?Patient: Jesus Ewing [id number] DOB: 2001/04/21 ?DOA: 10/31/2021 ?DOS: the patient was seen and examined on 11/01/2021 ?PCP: [facility] [place]  ?Patient coming from: Home ? ?Chief Complaint:  ?Chief Complaint  ?Patient presents with  ? Hyperglycemia  ? Shortness of Breath  ? ?HPI: Jesus Ewing is a 21 y.o. male with medical history significant of DM I, H/O DKA coming with n/v since Wednesday and not being able to eat since then. Pt cannot provide much history he is acutely ill, does report pan in left great toe for infection ad has upcoming procedure with his podiatry that he does not remember name of . Pt is dyspneic and tachycardic. Pt sees endocrinology.  ? ? ?Review of Systems: unable to review all systems due to the inability of the patient to answer questions. ?Past Medical History:  ?Diagnosis Date  ? Celiac disease   ? Diabetes mellitus without complication ([place])   ? ?Past Surgical History:  ?Procedure Laterality Date  ? NO PAST SURGERIES    ? ?Social History:  reports that he has never smoked. He has never used smokeless tobacco. He reports current alcohol use. He reports current drug use. Drug: Cocaine. ? ?No Known Allergies ? ?Family History  ?Family history unknown: Yes  ? ? ?Prior to Admission medications   ?Medication Sig Start Date End Date Taking? Authorizing Provider  ?insulin lispro (HUMALOG) 100 UNIT/ML KwikPen Inject 0-50 Units into the skin as directed. 07/24/18   [provider]  ?LANTUS SOLOSTAR 100 UNIT/ML Solostar Pen Inject 50 Units into the skin at bedtime. 05/04/19   [provider]  ?levETIRAcetam (KEPPRA) 500 MG tablet Take 1 tablet (500 mg total) by mouth 2 (two) times daily. 07/05/21 08/04/21  Lorella Nimrod, MD  ? ? ?Physical Exam: ?Vitals:  ? 10/31/21 2158 10/31/21 2159  ?BP: (!) 143/93   ?Pulse: (!) 130   ?Resp: (!) 36   ?SpO2: 96%   ?Weight:  69.4 kg  ?Height:  5\' 10"  (1.778 m)  ?Physical Exam ?Vitals  and nursing note reviewed.  ?Constitutional:   ?   General: He is in acute distress.  ?   Appearance: He is ill-appearing. He is not toxic-appearing or diaphoretic.  ?HENT:  ?   Head: Normocephalic and atraumatic.  ?   Right Ear: Hearing and external ear normal.  ?   Left Ear: Hearing and external ear normal.  ?   Nose: Nose normal. No nasal deformity.  ?   Mouth/Throat:  ?   Lips: Pink.  ?   Mouth: Mucous membranes are moist.  ?   Tongue: No lesions.  ?   Pharynx: Oropharynx is clear.  ?Eyes:  ?   General: Lids are normal.  ?   Pupils: Pupils are equal, round, and reactive to light.  ?Cardiovascular:  ?   Rate and Rhythm: Regular rhythm. Tachycardia present.  ?   Pulses:     ?     Dorsalis pedis pulses are 2+ on the right side and 2+ on the left side.  ?     Posterior tibial pulses are 2+ on the right side and 2+ on the left side.  ?   Heart sounds: Normal heart sounds.  ?Pulmonary:  ?   Effort: Respiratory distress present.  ?   Breath sounds: Wheezing present.  ?Abdominal:  ?   General: Bowel sounds are normal. There is no distension.  ?   Palpations: Abdomen is soft. There  is no mass.  ?   Tenderness: There is no abdominal tenderness. There is no guarding.  ?   Hernia: No hernia is present.  ?Musculoskeletal:  ?   Right lower leg: No edema.  ?   Left lower leg: No edema.  ?Skin: ?   General: Skin is warm.  ?Neurological:  ?   General: No focal deficit present.  ?   Mental Status: He is lethargic.  ?   Cranial Nerves: Cranial nerves 2-12 are intact.  ?   Motor: Motor function is intact.  ?Psychiatric:     ?   Attention and Perception: He is inattentive.     ?   Mood and Affect: Mood is anxious.     ?   Speech: Speech normal.     ?   Behavior: Behavior is cooperative.     ?   Cognition and Memory: Cognition normal.  ? ? ?Data Reviewed: ?Results for orders placed or performed during the hospital encounter of 10/31/21 (from the past 24 hour(s))  ?Basic metabolic panel     Status: Abnormal  ? Collection Time:  10/31/21 10:02 PM  ?Result Value Ref Range  ? Sodium 131 (L) 135 - 145 mmol/L  ? Potassium 6.1 (H) 3.5 - 5.1 mmol/L  ? Chloride 96 (L) 98 - 111 mmol/L  ? CO2 <7 (L) 22 - 32 mmol/L  ? Glucose, Bld 550 (HH) 70 - 99 mg/dL  ? BUN 17 6 - 20 mg/dL  ? Creatinine, Ser 1.30 (H) 0.61 - 1.24 mg/dL  ? Calcium 8.6 (L) 8.9 - 10.3 mg/dL  ? GFR, Estimated >60 >60 mL/min  ? Anion gap NOT CALCULATED 5 - 15  ?CBC     Status: Abnormal  ? Collection Time: 10/31/21 10:02 PM  ?Result Value Ref Range  ? WBC 12.4 (H) 4.0 - 10.5 K/uL  ? RBC 5.03 4.22 - 5.81 MIL/uL  ? Hemoglobin 16.9 13.0 - 17.0 g/dL  ? HCT 50.9 39.0 - 52.0 %  ? MCV 101.2 (H) 80.0 - 100.0 fL  ? MCH 33.6 26.0 - 34.0 pg  ? MCHC 33.2 30.0 - 36.0 g/dL  ? RDW 12.1 11.5 - 15.5 %  ? Platelets 563 (H) 150 - 400 K/uL  ? nRBC 0.0 0.0 - 0.2 %  ?CBG monitoring, ED     Status: Abnormal  ? Collection Time: 10/31/21 10:08 PM  ?Result Value Ref Range  ? Glucose-Capillary 572 (HH) 70 - 99 mg/dL  ? Comment 1 Notify RN   ? Comment 2 Document in Chart   ? Comment 3 Call MD NNP PA CNM   ?Urinalysis, Routine w reflex microscopic     Status: Abnormal  ? Collection Time: 10/31/21 10:33 PM  ?Result Value Ref Range  ? Color, Urine STRAW (A) YELLOW  ? APPearance CLEAR (A) CLEAR  ? Specific Gravity, Urine 1.021 1.005 - 1.030  ? pH 5.0 5.0 - 8.0  ? Glucose, UA >=500 (A) NEGATIVE mg/dL  ? Hgb urine dipstick SMALL (A) NEGATIVE  ? Bilirubin Urine NEGATIVE NEGATIVE  ? Ketones, ur 80 (A) NEGATIVE mg/dL  ? Protein, ur 30 (A) NEGATIVE mg/dL  ? Nitrite NEGATIVE NEGATIVE  ? Leukocytes,Ua NEGATIVE NEGATIVE  ? RBC / HPF 0-5 0 - 5 RBC/hpf  ? WBC, UA NONE SEEN 0 - 5 WBC/hpf  ? Bacteria, UA RARE (A) NONE SEEN  ? Squamous Epithelial / LPF NONE SEEN 0 - 5  ? Mucus PRESENT   ?Blood gas, venous  Status: Abnormal  ? Collection Time: 10/31/21 10:33 PM  ?Result Value Ref Range  ? pH, Ven 7.01 (LL) 7.25 - 7.43  ? pCO2, Ven 22 (L) 44 - 60 mmHg  ? pO2, Ven 50 (H) 32 - 45 mmHg  ? Bicarbonate 5.6 (L) 20.0 - 28.0 mmol/L  ?  Acid-base deficit 24.1 (H) 0.0 - 2.0 mmol/L  ? O2 Saturation 72.4 %  ? Patient temperature 37.0   ? Collection site VEIN   ?CBG monitoring, ED     Status: Abnormal  ? Collection Time: 10/31/21 10:49 PM  ?Result Value Ref Range  ? Glucose-Capillary 500 (H) 70 - 99 mg/dL  ?CBG monitoring, ED     Status: Abnormal  ? Collection Time: 10/31/21 11:24 PM  ?Result Value Ref Range  ? Glucose-Capillary 445 (H) 70 - 99 mg/dL  ?Basic metabolic panel     Status: Abnormal  ? Collection Time: 10/31/21 11:44 PM  ?Result Value Ref Range  ? Sodium 137 135 - 145 mmol/L  ? Potassium 3.9 3.5 - 5.1 mmol/L  ? Chloride 107 98 - 111 mmol/L  ? CO2 <7 (L) 22 - 32 mmol/L  ? Glucose, Bld 431 (H) 70 - 99 mg/dL  ? BUN 17 6 - 20 mg/dL  ? Creatinine, Ser 1.10 0.61 - 1.24 mg/dL  ? Calcium 7.3 (L) 8.9 - 10.3 mg/dL  ? GFR, Estimated >60 >60 mL/min  ? Anion gap NOT CALCULATED 5 - 15  ?CBG monitoring, ED     Status: Abnormal  ? Collection Time: 11/01/21 12:07 AM  ?Result Value Ref Range  ? Glucose-Capillary 419 (H) 70 - 99 mg/dL  ? ?>>EKG today: sinus tach and lvh. ? ? ?Assessment and Plan: ?* DKA (diabetic ketoacidosis) ([place]) ?Pt admit for severe DKA. ?Pt started on DKA protocol stat. ?Repeat cmp for lft and potassium and magnesium assessment. ? ? ?Hyperkalemia ? ?  Latest Ref Rng & Units 10/31/2021  ? 11:44 PM 10/31/2021  ? 10:02 PM 07/05/2021  ?  5:15 AM  ?CMP  ?Glucose 70 - 99 mg/dL 431   550   209    ?BUN 6 - 20 mg/dL 17   17   14     ?Creatinine 0.61 - 1.24 mg/dL 1.10   1.30   0.34    ?Sodium 135 - 145 mmol/L 137   131   140    ?Potassium 3.5 - 5.1 mmol/L 3.9   6.1   3.4    ?Chloride 98 - 111 mmol/L 107   96   106    ?CO2 22 - 32 mmol/L <7   <7   26    ?Calcium 8.9 - 10.3 mg/dL 7.3   8.6   8.5    ?Resolved. ? ? ? ?AKI (acute kidney injury) ([place]) ?Lab Results  ?Component Value Date  ? CREATININE 1.10 10/31/2021  ? CREATININE 1.30 (H) 10/31/2021  ? CREATININE 0.34 (L) 07/05/2021  ?resolved. ? ? ?Seizure ([place]) ?Seizure precaution. ?Cont keppra.   ? ? ? ?Advance Care Planning:  ?  Code Status: Full Code  ? ?Consults:  ?None  ? ?Family Communication:  ?Douglas, Racz (Mother)  ?[phone] (Mobile) ? ?Severity of Illness: ?The appropriate patient status for this

## 2021-10-31 NOTE — ED Notes (Signed)
IV access attempted x1 unsuccessful ?

## 2021-10-31 NOTE — ED Triage Notes (Addendum)
Pt having diff breathing.  Pt in DKA and pt smells tutty fruity.  Sx for 5 days. No n/v/d pt alert   pt eating ice chips in triage.  ?

## 2021-10-31 NOTE — ED Notes (Signed)
Fsbs 572 ?

## 2021-11-01 LAB — BASIC METABOLIC PANEL
Anion gap: 12 (ref 5–15)
Anion gap: 17 — ABNORMAL HIGH (ref 5–15)
BUN: 13 mg/dL (ref 6–20)
BUN: 15 mg/dL (ref 6–20)
BUN: 17 mg/dL (ref 6–20)
CO2: 13 mmol/L — ABNORMAL LOW (ref 22–32)
CO2: 14 mmol/L — ABNORMAL LOW (ref 22–32)
CO2: <7 mmol/L — ABNORMAL LOW (ref 22–32)
Calcium: 7.3 mg/dL — ABNORMAL LOW (ref 8.9–10.3)
Calcium: 7.8 mg/dL — ABNORMAL LOW (ref 8.9–10.3)
Calcium: 8.2 mg/dL — ABNORMAL LOW (ref 8.9–10.3)
Chloride: 104 mmol/L (ref 98–111)
Chloride: 107 mmol/L (ref 98–111)
Chloride: 110 mmol/L (ref 98–111)
Creatinine, Ser: 0.76 mg/dL (ref 0.61–1.24)
Creatinine, Ser: 0.8 mg/dL (ref 0.61–1.24)
Creatinine, Ser: 1.1 mg/dL (ref 0.61–1.24)
GFR, Estimated: >60 mL/min (ref >60)
GFR, Estimated: >60 mL/min (ref >60)
GFR, Estimated: >60 mL/min (ref >60)
Glucose, Bld: 157 mg/dL — ABNORMAL HIGH (ref 70–99)
Glucose, Bld: 229 mg/dL — ABNORMAL HIGH (ref 70–99)
Glucose, Bld: 431 mg/dL — ABNORMAL HIGH (ref 70–99)
Potassium: 3.4 mmol/L — ABNORMAL LOW (ref 3.5–5.1)
Potassium: 3.4 mmol/L — ABNORMAL LOW (ref 3.5–5.1)
Potassium: 3.9 mmol/L (ref 3.5–5.1)
Sodium: 134 mmol/L — ABNORMAL LOW (ref 135–145)
Sodium: 136 mmol/L (ref 135–145)
Sodium: 137 mmol/L (ref 135–145)

## 2021-11-01 LAB — COMPREHENSIVE METABOLIC PANEL
ALT: 18 U/L (ref 0–44)
AST: 15 U/L (ref 15–41)
Albumin: 3.7 g/dL (ref 3.5–5.0)
Alkaline Phosphatase: 136 U/L — ABNORMAL HIGH (ref 38–126)
BUN: 16 mg/dL (ref 6–20)
CO2: <7 mmol/L — ABNORMAL LOW (ref 22–32)
Calcium: 7.5 mg/dL — ABNORMAL LOW (ref 8.9–10.3)
Chloride: 109 mmol/L (ref 98–111)
Creatinine, Ser: 0.83 mg/dL (ref 0.61–1.24)
GFR, Estimated: >60 mL/min (ref >60)
Glucose, Bld: 245 mg/dL — ABNORMAL HIGH (ref 70–99)
Potassium: 3.8 mmol/L (ref 3.5–5.1)
Sodium: 137 mmol/L (ref 135–145)
Total Bilirubin: 2.1 mg/dL — ABNORMAL HIGH (ref 0.3–1.2)
Total Protein: 7.5 g/dL (ref 6.5–8.1)

## 2021-11-01 LAB — GLUCOSE, CAPILLARY
Glucose-Capillary: 255 mg/dL — ABNORMAL HIGH (ref 70–99)
Glucose-Capillary: 173 mg/dL — ABNORMAL HIGH (ref 70–99)
Glucose-Capillary: 129 mg/dL — ABNORMAL HIGH (ref 70–99)
Glucose-Capillary: 194 mg/dL — ABNORMAL HIGH (ref 70–99)
Glucose-Capillary: 176 mg/dL — ABNORMAL HIGH (ref 70–99)
Glucose-Capillary: 170 mg/dL — ABNORMAL HIGH (ref 70–99)
Glucose-Capillary: 151 mg/dL — ABNORMAL HIGH (ref 70–99)
Glucose-Capillary: 194 mg/dL — ABNORMAL HIGH (ref 70–99)
Glucose-Capillary: 216 mg/dL — ABNORMAL HIGH (ref 70–99)
Glucose-Capillary: 230 mg/dL — ABNORMAL HIGH (ref 70–99)
Glucose-Capillary: 254 mg/dL — ABNORMAL HIGH (ref 70–99)

## 2021-11-01 LAB — URINE DRUG SCREEN, QUALITATIVE (ARMC ONLY)
Amphetamines, Ur Screen: NOT DETECTED
Barbiturates, Ur Screen: NOT DETECTED
Benzodiazepine, Ur Scrn: NOT DETECTED
Cannabinoid 50 Ng, Ur ~~LOC~~: NOT DETECTED
Cocaine Metabolite,Ur ~~LOC~~: NOT DETECTED
MDMA (Ecstasy)Ur Screen: NOT DETECTED
Methadone Scn, Ur: NOT DETECTED
Opiate, Ur Screen: NOT DETECTED
Phencyclidine (PCP) Ur S: NOT DETECTED
Tricyclic, Ur Screen: NOT DETECTED

## 2021-11-01 LAB — BETA-HYDROXYBUTYRIC ACID
Beta-Hydroxybutyric Acid: >8 mmol/L — ABNORMAL HIGH (ref 0.05–0.27)
Beta-Hydroxybutyric Acid: 7.88 mmol/L — ABNORMAL HIGH (ref 0.05–0.27)

## 2021-11-01 LAB — MAGNESIUM: Magnesium: 2.1 mg/dL (ref 1.7–2.4)

## 2021-11-01 LAB — BLOOD GAS, VENOUS
Acid-base deficit: 24.1 mmol/L — ABNORMAL HIGH (ref 0.0–2.0)
Acid-base deficit: 10.2 mmol/L — ABNORMAL HIGH (ref 0.0–2.0)
Bicarbonate: 5.9 mmol/L — ABNORMAL LOW (ref 20.0–28.0)
Bicarbonate: 14.4 mmol/L — ABNORMAL LOW (ref 20.0–28.0)
O2 Saturation: 64.2 %
O2 Saturation: 79.4 %
Patient temperature: 37
Patient temperature: 37
pCO2, Ven: 24 mmHg — ABNORMAL LOW (ref 44–60)
pCO2, Ven: 28 mmHg — ABNORMAL LOW (ref 44–60)
pH, Ven: 7 — CL (ref 7.25–7.43)
pH, Ven: 7.32 (ref 7.25–7.43)
pO2, Ven: 34 mmHg (ref 32–45)
pO2, Ven: 40 mmHg (ref 32–45)

## 2021-11-01 LAB — URINALYSIS, ROUTINE W REFLEX MICROSCOPIC
Bilirubin Urine: NEGATIVE
Glucose, UA: >=500 mg/dL — AB
Ketones, ur: 80 mg/dL — AB
Leukocytes,Ua: NEGATIVE
Nitrite: NEGATIVE
Protein, ur: 30 mg/dL — AB
Specific Gravity, Urine: 1.021 (ref 1.005–1.030)
Squamous Epithelial / HPF: NONE SEEN (ref 0–5)
WBC, UA: NONE SEEN WBC/hpf (ref 0–5)
pH: 5 (ref 5.0–8.0)

## 2021-11-01 LAB — CBG MONITORING, ED
Glucose-Capillary: 343 mg/dL — ABNORMAL HIGH (ref 70–99)
Glucose-Capillary: 419 mg/dL — ABNORMAL HIGH (ref 70–99)

## 2021-11-01 LAB — ETHANOL: Alcohol, Ethyl (B): <10 mg/dL (ref <10)

## 2021-11-01 LAB — MRSA NEXT GEN BY PCR, NASAL: MRSA by PCR Next Gen: NOT DETECTED

## 2021-11-01 LAB — LIPASE, BLOOD: Lipase: 28 U/L (ref 11–51)

## 2021-11-01 MED ORDER — CHLORHEXIDINE GLUCONATE CLOTH 2 % EX PADS: 6.0000 | MEDICATED_PAD | Freq: Every day | CUTANEOUS | Status: DC

## 2021-11-01 MED ORDER — INSULIN GLARGINE-YFGN 100 UNIT/ML ~~LOC~~ SOLN
50.0000 [IU] | Freq: Every day | SUBCUTANEOUS | Status: DC
  Administered 2021-11-01: 50 [IU] via SUBCUTANEOUS
  Filled 2021-11-01: qty 0.5

## 2021-11-01 MED ORDER — KETOROLAC TROMETHAMINE 15 MG/ML IJ SOLN
15.0000 mg | Freq: Once | INTRAMUSCULAR | Status: AC
  Administered 2021-11-01: 15 mg via INTRAVENOUS
  Filled 2021-11-01: qty 1

## 2021-11-01 MED ORDER — POTASSIUM CHLORIDE CRYS ER 20 MEQ PO TBCR
40.0000 meq | EXTENDED_RELEASE_TABLET | Freq: Once | ORAL | Status: AC
Start: 2021-11-01 — End: 2021-11-01
  Administered 2021-11-01: 40 meq via ORAL
  Filled 2021-11-01: qty 2

## 2021-11-01 MED ORDER — INSULIN ASPART 100 UNIT/ML IJ SOLN
3.0000 [IU] | Freq: Three times a day (TID) | INTRAMUSCULAR | Status: DC
  Administered 2021-11-01: 3 [IU] via SUBCUTANEOUS
  Filled 2021-11-01: qty 1

## 2021-11-01 MED ORDER — ALUM & MAG HYDROXIDE-SIMETH 200-200-20 MG/5ML PO SUSP
30.0000 mL | ORAL | Status: AC
  Administered 2021-11-01: 30 mL via ORAL
  Filled 2021-11-01: qty 30

## 2021-11-01 MED ORDER — INSULIN ASPART 100 UNIT/ML IJ SOLN
0.0000 [IU] | Freq: Three times a day (TID) | INTRAMUSCULAR | Status: DC
  Administered 2021-11-01: 3 [IU] via SUBCUTANEOUS
  Filled 2021-11-01: qty 1

## 2021-11-01 MED ORDER — CALCIUM GLUCONATE-NACL 2-0.675 GM/100ML-% IV SOLN
2.0000 g | Freq: Once | INTRAVENOUS | Status: AC
  Administered 2021-11-01: 2000 mg via INTRAVENOUS
  Filled 2021-11-01 (×2): qty 100

## 2021-11-01 MED ORDER — SODIUM BICARBONATE 8.4 % IV SOLN
50.0000 meq | Freq: Once | INTRAVENOUS | Status: AC
  Administered 2021-11-01: 50 meq via INTRAVENOUS
  Filled 2021-11-01: qty 50

## 2021-11-01 MED ORDER — INSULIN ASPART 100 UNIT/ML IJ SOLN: 0.0000 [IU] | Freq: Every day | INTRAMUSCULAR | Status: DC

## 2021-11-01 NOTE — Assessment & Plan Note (Signed)
Lab Results  ?Component Value Date  ? CREATININE 1.10 10/31/2021  ? CREATININE 1.30 (H) 10/31/2021  ? CREATININE 0.34 (L) 07/05/2021  ?resolved. ? ?

## 2021-11-01 NOTE — Progress Notes (Signed)
Nutrition Brief Note ? ?RD consulted for assessment of nutritional requirements and status.  ? ?Wt Readings from Last 15 Encounters:  ?11/01/21 60.1 kg  ?07/05/21 69.4 kg  ?02/14/21 67.2 kg  ?03/09/20 72.3 kg  ?05/26/19 72.6 kg  ?02/14/19 68 kg  ?06/19/16 68.3 kg (56 %, Z= 0.15)*  ? ?* Growth percentiles are based on CDC (Boys, 2-20 Years) data.  ? ?Jesus Ewing is a 21 y.o. male with medical history significant of DM I, H/O DKA coming with n/v since Wednesday and not being able to eat since then. Pt cannot provide much history he is acutely ill, does report pan in left great toe for infection ad has upcoming procedure with his podiatry that he does not remember name of . Pt is dyspneic and tachycardic. Pt sees endocrinology.  ?  ?Pt admitted with DKA.  ? ?Reviewed I/O's: +810 ml x 24 hours ? ?UOP: 1 L x 24 hours ? ?Attempted to see pt x 2, however, in with other providers at times of visits.  ? ?Pt transitioned off insulin drip today. Noted that pt has had several admissions with  DKA in the past and has history of not taking medications. Noted pt also struggles with cocaine abuse and has been in and out of rehab for this. Suspect some degree of weight loss is related to medication noncompliance.  ? ?Pt to discharge home today per MD.  ? ?Lab Results  ?Component Value Date  ? HGBA1C 10.0 (H) 07/03/2021  ?  PTA DM medications are 0-50 units insulin lispro as directed and 50 units lantus at bedtime.  ? ?Labs reviewed: CBGS: 151-254 (inpatient orders for glycemic control are 0-5 units insulin aspart daily at bedtime, 0-9 units insulin aspart TID with meals, 3 units insulin aspart TID with meals, and 50 units insulin glargine-yfgn daily).   ? ?Current diet order is carb modified, patient is consuming approximately n/a% of meals at this time. Labs and medications reviewed.  ? ?No nutrition interventions warranted at this time. If nutrition issues arise, please consult RD.  ? ?Levada Schilling, RD, LDN,  CDCES ?Registered Dietitian II ?Certified Diabetes Care and Education Specialist ?Please refer to [facility] for RD and/or RD on-call/weekend/after hours pager   ?

## 2021-11-01 NOTE — Assessment & Plan Note (Signed)
?    Latest Ref Rng & Units 10/31/2021  ? 11:44 PM 10/31/2021  ? 10:02 PM 07/05/2021  ?  5:15 AM  ?CMP  ?Glucose 70 - 99 mg/dL 098   119   147    ?BUN 6 - 20 mg/dL 17   17   14     ?Creatinine 0.61 - 1.24 mg/dL   8.29   5.62    ?Sodium 135 - 145 mmol/L 137   131   140    ?Potassium 3.5 - 5.1 mmol/L 3.9   6.1   3.4    ?Chloride 98 - 111 mmol/L 107   96   106    ?CO2 22 - 32 mmol/L <7   <7   26    ?Calcium 8.9 - 10.3 mg/dL 7.3   8.6   8.5    ?Resolved. ? ? ?

## 2021-11-01 NOTE — Assessment & Plan Note (Signed)
Pt admit for severe DKA. ?Pt started on DKA protocol stat. ?Repeat cmp for lft and potassium and magnesium assessment. ? ?

## 2021-11-01 NOTE — TOC Transition Note (Addendum)
Transition of Care (TOC) - CM/SW Discharge Note ? ? ?Patient Details  ?Name: Jesus Ewing ?MRN: [id number] ?Date of Birth: 24-Jun-2001 ? ?Transition of Care (TOC) CM/SW Contact:  ?Candie Chroman, LCSW ?Phone Number: ?11/01/2021, 11:52 AM ? ? ?Clinical Narrative:   Patient has orders to discharge home today. CSW met with patient. No supports at bedside. CSW introduced role and inquired about not having PCP. Patient confirmed. He is agreeable to appointment at [facility]. Appointment scheduled with Georgian Co, NP on May 1. Patient also asked about getting an appointment with Luella Cook, PA at [facility] so he can be cleared to get his license. He said he has been having difficulty getting an appointment there. Called and scheduled appointment for next Tuesday, 4/25. Information added to AVS. Unable to provide medication assistance since he has insurance. He is not discharging on any new medications. Patient said he will have a ride home today. No further concerns. CSW signing off. ? ?Final next level of care: Home/Self Care ?Barriers to Discharge: No Barriers Identified ? ? ?Patient Goals and CMS Choice ?  ?  ?  ? ?Discharge Placement ?  ?           ?  ?  ?  ?Patient and family notified of of transfer: 11/01/21 ? ?Discharge Plan and Services ?  ?  ?           ?  ?  ?  ?  ?  ?  ?  ?  ?  ?  ? ?Social Determinants of Health (SDOH) Interventions ?  ? ? ?Readmission Risk Interventions ?   ? View : No data to display.  ?  ?  ?  ? ? ? ? ? ?

## 2021-11-01 NOTE — Progress Notes (Signed)
Patient alert and oriented x4.Transition off insulin drip per orders. Patient has conflicting schedules with after visit appointments, patient states he will call to rearrange. Medication and discharge education provided. ?

## 2021-11-01 NOTE — Progress Notes (Signed)
Inpatient Diabetes Program Recommendations ? ?AACE/ADA: New Consensus Statement on Inpatient Glycemic Control (2015) ? ?Target Ranges:  Prepandial:   less than 140 mg/dL ?     Peak postprandial:   less than 180 mg/dL (1-2 hours) ?     Critically ill patients:  140 - 180 mg/dL  ? ?Lab Results  ?Component Value Date  ? GLUCAP 230 (H) 11/01/2021  ? HGBA1C 10.0 (H) 07/03/2021  ? ? ?Review of Glycemic Control ? ?Inpatient Diabetes Program Recommendations:   ? ?Spoke with patient @ bedside regarding diabetes management. Patient states he didn't take his insulin since he was in the bed x 3 days sleeping. Reviewed with patient need to check CBG and take insulin. Reviewed he has type 1 diabetes and cannot go without his insulin without getting sick. Reviewed sick day rules along with hypoglycemia protocol. ? ?Patient shared that he has issues @ home but didn't expand to discuss what the issues were that were hindering his taking his insulin or following nutrition guidelines. States he has plenty of insulin. States he doesn't have anyone to assist him with taking insulin. ? ?Patient given 2 libre sensors to take home for use per order Dr. Manuella Ghazi. Patient has used before and didn't want placed prior to discharge home. ? ?Thank you, ?Nani Gasser Katlynn Naser, RN, MSN, CDE  ?Diabetes Coordinator ?Inpatient Glycemic Control Team ?Team Pager [phone] (8am-5pm) ?11/01/2021 2:45 PM ? ? ? ?

## 2021-11-01 NOTE — Assessment & Plan Note (Signed)
Seizure precaution. ?Cont keppra.  ?

## 2021-11-01 NOTE — Progress Notes (Signed)
Inpatient Diabetes Program Recommendations ? ?AACE/ADA: New Consensus Statement on Inpatient Glycemic Control (2015) ? ?Target Ranges:  Prepandial:   less than 140 mg/dL ?     Peak postprandial:   less than 180 mg/dL (1-2 hours) ?     Critically ill patients:  140 - 180 mg/dL  ? ?Lab Results  ?Component Value Date  ? GLUCAP 194 (H) 11/01/2021  ? HGBA1C 10.0 (H) 07/03/2021  ? ? ?Review of Glycemic Control ? ?Diabetes history: DM1 ?Outpatient Diabetes medications: Lantus 50 units QHS, Humalog (1 unit for 8 grams of carbs, 1 unit drops glucose 25 mg/dl) ?Current orders for Inpatient glycemic control: IV insulin transitioning to :Semglee 50 units daily, Novolog 0-9 units TID with meals, Novolog 0-5 units QHS, Novolog 3 units TID with meals ? ?Inpatient Diabetes Program Recommendations:   ?Patient is followed by [facility] endocrinology with last office visit 10/05/21. DM coordinator spoke with patient on admission 07/05/21.  ?Agree with above regimen. Typically start with 80% home basal insulin dose with type 1 patients. ?Patient has been wearing dexcom sensor and has had insulin pump training expressing interest in an insulin pump. ? ?Plan to speak with patient. ? ?Thank you, ?Billy Fischer Weslee Prestage, RN, MSN, CDE  ?Diabetes Coordinator ?Inpatient Glycemic Control Team ?Team Pager [phone] (8am-5pm) ?11/01/2021 10:35 AM ? ? ? ? ?

## 2021-11-01 NOTE — ED Notes (Addendum)
Tech tried 6 times to do an oral temp and was unsuccessful  ?Tried x2 auxiliary  and was unsuccessful  ?RN notified  ? ?Pt also said he just threw up again right before tech entered room  ?

## 2021-11-01 NOTE — Plan of Care (Signed)
  Problem: Education: Goal: Knowledge of General Education information will improve Description: Including pain rating scale, medication(s)/side effects and non-pharmacologic comfort measures Outcome: Progressing   Problem: Activity: Goal: Risk for activity intolerance will decrease Outcome: Progressing   

## 2021-11-02 NOTE — Discharge Summary (Signed)
?Physician Discharge Summary ?  ?Patient: Jesus Ewing MRN: [id number] DOB: 12/20/2000  ?Admit date:     10/31/2021  ?Discharge date: 11/01/2021  ?Discharge Physician: Delfino Lovett  ? ?PCP: [facility]  ? ?Recommendations at discharge:  ? ? F/up with outpt providers as requested ? ?Discharge Diagnoses: ?Principal Problem: ?  DKA (diabetic ketoacidosis) (HCC) ?Active Problems: ?  Seizure (HCC) ?  AKI (acute kidney injury) (HCC) ?  Hyperkalemia ? ?Assessment and Plan: ?* DKA (diabetic ketoacidosis) (HCC) ?Pt admit for severe DKA. ?Pt started on DKA protocol stat. ?Repeat cmp for lft and potassium and magnesium assessment. ? ?Hypokalemia ?Repleted ? ?AKI ?Prerenal. Resolved with hydration ? ?Seizure (HCC) ?Cont keppra.  ? ? ? ? ?  ? ? ?Disposition: Home ?Diet recommendation:  ?Discharge Diet Orders (From admission, onward)  ? ?  Start     Ordered  ? 11/01/21 0000  Diet - low sodium heart healthy       ? 11/01/21 1053  ? ?  ?  ? ?  ? ?Carb modified diet ?DISCHARGE MEDICATION: ?Allergies as of 11/01/2021   ?No Known Allergies ?  ? ?  ?Medication List  ?  ? ?TAKE these medications   ? ?doxycycline 100 MG capsule ?Commonly known as: VIBRAMYCIN ?Take 100 mg by mouth 2 (two) times daily. ?  ?insulin lispro 100 UNIT/ML KwikPen ?Commonly known as: HUMALOG ?Inject 0-50 Units into the skin as directed. ?  ?Lantus SoloStar 100 UNIT/ML Solostar Pen ?Generic drug: insulin glargine ?Inject 50 Units into the skin at bedtime. ?  ?levETIRAcetam 500 MG tablet ?Commonly known as: KEPPRA ?Take 1 tablet (500 mg total) by mouth 2 (two) times daily. ?  ? ?  ? ? Follow-up Information   ? ? [facility]. Schedule an appointment as soon as possible for a visit in 2 day(s).   ?Why: [facility] Discharge F/UP ?Contact information: ?[address] ?[address] ?[place] [address] ?[phone] ? ? ?  ?  ? ? Collene Schlichter, Christiane Ha, MD. Schedule an appointment as soon as possible for a visit in 1  week(s).   ?Specialty: Endocrinology ?Why: [facility] Discharge F/UP ?Appointment scheduled for Thur. April 20 at 320 pm. pleas arrive 20 minutes early ?Contact information: ?[address] ?[place] [address] ?[phone] ? ? ?  ?  ? ? Candelaria Stagers, DPM. Schedule an appointment as soon as possible for a visit on 11/03/2021.   ?Specialty: Podiatry ?Why: as scheduled ?Contact information: ?[address] ?[place] [address] ?[phone] ? ? ?  ?  ? ? Miki Kins, FNP. Go on 11/14/2021.   ?Specialty: Family Medicine ?Why: New primary care appointment. Please arrive at 9:30 for your 10:00 appointment. Bring your insurance card, photo ID, and medications in their bottles. ?Contact information: ?[address] ?[place] [address] ?[phone] ? ? ?  ?  ? ? Bridgette Habermann, PA-C Follow up on 11/08/2021.   ?Specialty: Physician Assistant ?Why: Appointment at 3:00 ?Contact information: ?[address] ?[place] [address] ?[phone] ? ? ?  ?  ? ?  ?  ? ?  ? ?Discharge Exam: ?Filed Weights  ? 10/31/21 2159 11/01/21 0154  ?Weight: 69.4 kg 60.1 kg  ? ?9 y m in no acute distress ?Lungs: CTA b/l. No Wheezing or rales ?Cardio: S1,S2 normal ?Abd: soft, benign ?Neuro: alert, oriented. Non-focal ?Skin: no rash/lesion. ? ?Condition at discharge: good ? ?The results of significant diagnostics from this hospitalization (  including imaging, microbiology, ancillary and laboratory) are listed below for reference.  ? ?Imaging Studies: ?DG Chest Port 1 View ? ?Result Date: 10/31/2021 ?CLINICAL DATA:  Shortness of breath with hyperglycemia. EXAM: PORTABLE CHEST 1 VIEW COMPARISON:  Portable chest 07/03/2021 FINDINGS: The heart size and mediastinal contours are within normal limits. Both lungs are clear. The visualized skeletal structures are unremarkable. There are multiple overlying monitor wires. IMPRESSION: No evidence of acute chest disease or interval changes. Electronically Signed   By: Almira Bar M.D.    On: 10/31/2021 22:40   ? ?Microbiology: ?Results for orders placed or performed during the hospital encounter of 10/31/21  ?MRSA Next Gen by PCR, Nasal     Status: None  ? Collection Time: 11/01/21  4:03 AM  ? Specimen: Nasal Mucosa; Nasal Swab  ?Result Value Ref Range Status  ? MRSA by PCR Next Gen NOT DETECTED NOT DETECTED Final  ?  Comment: (NOTE) ?The GeneXpert MRSA Assay (FDA approved for NASAL specimens only), ?is one component of a comprehensive MRSA colonization surveillance ?program. It is not intended to diagnose MRSA infection nor to guide ?or monitor treatment for MRSA infections. ?Test performance is not FDA approved in patients less than 2 years ?old. ?Performed at [facility], [address] [place], ?[place] [address] ?  ? ? ?Labs: ?CBC: ?Recent Labs  ?Lab 10/31/21 ?2202  ?WBC 12.4*  ?HGB 16.9  ?HCT 50.9  ?MCV 101.2*  ?PLT 563*  ? ?Basic Metabolic Panel: ?Recent Labs  ?Lab 10/31/21 ?2202 10/31/21 ?2344 11/01/21 ?0203 11/01/21 ?0719 11/01/21 ?1111  ?NA 131* 137 137 136 134*  ?K 6.1* 3.9 3.8 3.4* 3.4*  ?CL 96* 107 109 110 104  ?CO2 <7* <7* <7* 14* 13*  ?GLUCOSE 550* 431* 245* 157* 229*  ?BUN 17 17 16 15 13   ?CREATININE 1.30* 1.10 0.83 0.80 0.76  ?CALCIUM 8.6* 7.3* 7.5* 7.8* 8.2*  ?MG  --   --  2.1  --   --   ? ?Liver Function Tests: ?Recent Labs  ?Lab 11/01/21 ?0203  ?AST 15  ?ALT 18  ?ALKPHOS 136*  ?BILITOT 2.1*  ?PROT 7.5  ?ALBUMIN 3.7  ? ?CBG: ?Recent Labs  ?Lab 11/01/21 ?0806 11/01/21 ?1005 11/01/21 ?1107 11/01/21 ?1244 11/01/21 ?1323  ?GLUCAP 151* 194* 216* 254* 230*  ? ? ?Discharge time spent: greater than 30 minutes. ? ?Signed: ?11/03/21, MD ?[facility] ?11/02/2021 ?

## 2021-11-03 ENCOUNTER — Ambulatory Visit: Payer: 59 | Admitting: Podiatry

## 2021-11-03 DIAGNOSIS — E1165 Type 2 diabetes mellitus with hyperglycemia: Secondary | ICD-10-CM | POA: Diagnosis not present

## 2021-11-03 DIAGNOSIS — L600 Ingrowing nail: Secondary | ICD-10-CM

## 2021-11-03 MED ORDER — SULFAMETHOXAZOLE-TRIMETHOPRIM 800-160 MG PO TABS: 1.0000 | ORAL_TABLET | Freq: Two times a day (BID) | ORAL | 0 refills | Status: AC

## 2021-11-07 ENCOUNTER — Telehealth: Payer: Self-pay

## 2021-11-07 NOTE — Telephone Encounter (Signed)
Just FYI ?Patient called and stated that his toe was looking worse than before nail was removed and still pretty painful.  I explained that when using Phenol it can be very irritating to the skin and for him to start soaking once daily in epson salt bath, dry well then apply antibiotic ointment (Neosporin) on it, continue his Bactim DS and if no better closer to end of week, then he is to call and we will work him in.   He verbalized instructions ?

## 2021-11-08 ENCOUNTER — Ambulatory Visit (INDEPENDENT_AMBULATORY_CARE_PROVIDER_SITE_OTHER): Payer: 59 | Admitting: Podiatry

## 2021-11-08 ENCOUNTER — Emergency Department: Payer: 59

## 2021-11-08 ENCOUNTER — Encounter: Payer: Self-pay | Admitting: Podiatry

## 2021-11-08 ENCOUNTER — Inpatient Hospital Stay
Admission: EM | Admit: 2021-11-08 | Discharge: 2021-11-11 | DRG: 617 | Disposition: A | Discharge status: Home / Self Care | Payer: 59 | Attending: Osteopathic Medicine | Admitting: Osteopathic Medicine

## 2021-11-08 ENCOUNTER — Other Ambulatory Visit: Payer: Self-pay

## 2021-11-08 DIAGNOSIS — E1165 Type 2 diabetes mellitus with hyperglycemia: Secondary | ICD-10-CM | POA: Diagnosis not present

## 2021-11-08 DIAGNOSIS — K900 Celiac disease: Secondary | ICD-10-CM | POA: Diagnosis present

## 2021-11-08 DIAGNOSIS — E109 Type 1 diabetes mellitus without complications: Secondary | ICD-10-CM | POA: Diagnosis present

## 2021-11-08 DIAGNOSIS — E1069 Type 1 diabetes mellitus with other specified complication: Secondary | ICD-10-CM | POA: Diagnosis not present

## 2021-11-08 DIAGNOSIS — E871 Hypo-osmolality and hyponatremia: Secondary | ICD-10-CM | POA: Diagnosis not present

## 2021-11-08 DIAGNOSIS — L600 Ingrowing nail: Secondary | ICD-10-CM | POA: Diagnosis not present

## 2021-11-08 DIAGNOSIS — F419 Anxiety disorder, unspecified: Secondary | ICD-10-CM | POA: Diagnosis present

## 2021-11-08 DIAGNOSIS — L02611 Cutaneous abscess of right foot: Secondary | ICD-10-CM

## 2021-11-08 DIAGNOSIS — A419 Sepsis, unspecified organism: Secondary | ICD-10-CM

## 2021-11-08 DIAGNOSIS — Z794 Long term (current) use of insulin: Secondary | ICD-10-CM

## 2021-11-08 DIAGNOSIS — E1169 Type 2 diabetes mellitus with other specified complication: Secondary | ICD-10-CM | POA: Diagnosis present

## 2021-11-08 DIAGNOSIS — G4089 Other seizures: Secondary | ICD-10-CM | POA: Diagnosis present

## 2021-11-08 DIAGNOSIS — G40909 Epilepsy, unspecified, not intractable, without status epilepticus: Secondary | ICD-10-CM

## 2021-11-08 DIAGNOSIS — M869 Osteomyelitis, unspecified: Secondary | ICD-10-CM | POA: Diagnosis present

## 2021-11-08 DIAGNOSIS — F32A Depression, unspecified: Secondary | ICD-10-CM | POA: Diagnosis present

## 2021-11-08 DIAGNOSIS — E1065 Type 1 diabetes mellitus with hyperglycemia: Secondary | ICD-10-CM | POA: Diagnosis present

## 2021-11-08 DIAGNOSIS — F17290 Nicotine dependence, other tobacco product, uncomplicated: Secondary | ICD-10-CM | POA: Diagnosis present

## 2021-11-08 DIAGNOSIS — Z79899 Other long term (current) drug therapy: Secondary | ICD-10-CM

## 2021-11-08 DIAGNOSIS — F1490 Cocaine use, unspecified, uncomplicated: Secondary | ICD-10-CM | POA: Diagnosis present

## 2021-11-08 DIAGNOSIS — R569 Unspecified convulsions: Secondary | ICD-10-CM

## 2021-11-08 DIAGNOSIS — Z28310 Unvaccinated for covid-19: Secondary | ICD-10-CM

## 2021-11-08 DIAGNOSIS — L03031 Cellulitis of right toe: Secondary | ICD-10-CM | POA: Diagnosis not present

## 2021-11-08 LAB — CBC WITH DIFFERENTIAL/PLATELET
Abs Immature Granulocytes: 0.04 10*3/uL (ref 0.00–0.07)
Basophils Absolute: 0 10*3/uL (ref 0.0–0.1)
Basophils Relative: 0 %
Eosinophils Absolute: 0.2 10*3/uL (ref 0.0–0.5)
Eosinophils Relative: 3 %
HCT: 38.2 % — ABNORMAL LOW (ref 39.0–52.0)
Hemoglobin: 13.6 g/dL (ref 13.0–17.0)
Immature Granulocytes: 1 %
Lymphocytes Relative: 21 %
Lymphs Abs: 1.4 10*3/uL (ref 0.7–4.0)
MCH: 33.1 pg (ref 26.0–34.0)
MCHC: 35.6 g/dL (ref 30.0–36.0)
MCV: 92.9 fL (ref 80.0–100.0)
Monocytes Absolute: 0.9 10*3/uL (ref 0.1–1.0)
Monocytes Relative: 14 %
Neutro Abs: 4.1 10*3/uL (ref 1.7–7.7)
Neutrophils Relative %: 61 %
Platelets: 302 10*3/uL (ref 150–400)
RBC: 4.11 MIL/uL — ABNORMAL LOW (ref 4.22–5.81)
RDW: 11.9 % (ref 11.5–15.5)
WBC: 6.7 10*3/uL (ref 4.0–10.5)
nRBC: 0 % (ref 0.0–0.2)

## 2021-11-08 LAB — LACTIC ACID, PLASMA
Lactic Acid, Venous: 1.1 mmol/L (ref 0.5–1.9)
Lactic Acid, Venous: 5.4 mmol/L (ref 0.5–1.9)

## 2021-11-08 LAB — COMPREHENSIVE METABOLIC PANEL
ALT: 21 U/L (ref 0–44)
AST: 16 U/L (ref 15–41)
Albumin: 3.1 g/dL — ABNORMAL LOW (ref 3.5–5.0)
Alkaline Phosphatase: 112 U/L (ref 38–126)
Anion gap: 12 (ref 5–15)
BUN: 20 mg/dL (ref 6–20)
CO2: 22 mmol/L (ref 22–32)
Calcium: 8.4 mg/dL — ABNORMAL LOW (ref 8.9–10.3)
Chloride: 96 mmol/L — ABNORMAL LOW (ref 98–111)
Creatinine, Ser: 0.7 mg/dL (ref 0.61–1.24)
GFR, Estimated: >60 mL/min (ref >60)
Glucose, Bld: 377 mg/dL — ABNORMAL HIGH (ref 70–99)
Potassium: 3.9 mmol/L (ref 3.5–5.1)
Sodium: 130 mmol/L — ABNORMAL LOW (ref 135–145)
Total Bilirubin: 0.8 mg/dL (ref 0.3–1.2)
Total Protein: 6.5 g/dL (ref 6.5–8.1)

## 2021-11-08 LAB — CBG MONITORING, ED: Glucose-Capillary: 339 mg/dL — ABNORMAL HIGH (ref 70–99)

## 2021-11-08 IMAGING — MR MR TOES*R* WO/W CM
9 series · 40 of 40 positions shown · IV contrast (6ml Gadavist)
Comparison: None.

CLINICAL DATA: Erythema and pain in the great toe.  Diabetes.

EXAM:
MRI OF THE RIGHT TOES WITHOUT AND WITH CONTRAST
TECHNIQUE: Multiplanar, multisequence MR imaging of the right forefoot/toes was
performed both before and after administration of intravenous
contrast.
CONTRAST:  6mL GADAVIST GADOBUTROL 1 MMOL/ML IV SOLN

[Series 4: T2 · coronal · right · 3.0mm · 0.38mm/px · 5 of 44 slices shown (1 of 2)]
[im 1/44]
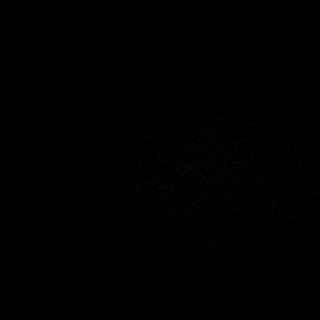
[im 11/44]
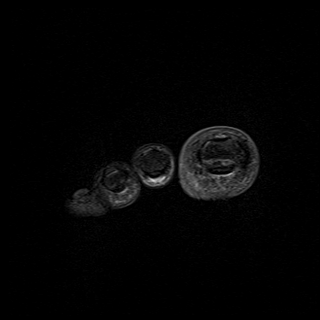
[im 22/44]
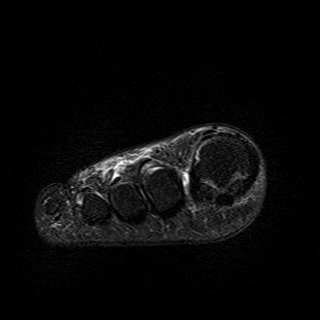
[im 33/44]
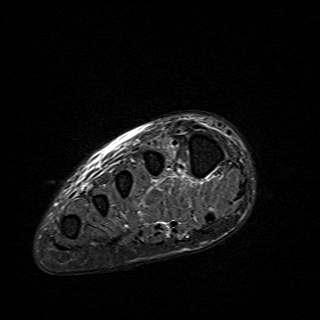
[im 44/44]
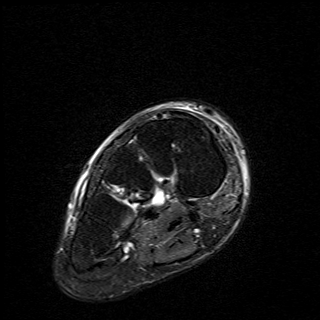

[Series 5: ax t1_in · coronal · right · 3.0mm · 0.38mm/px · 6 of 44 slices shown]
[im 1/44]
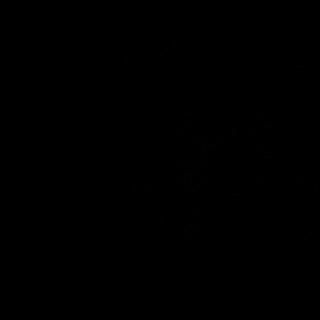
[im 9/44]
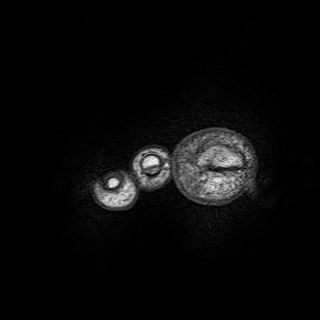
[im 18/44]
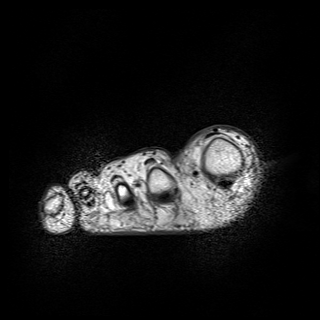
[im 26/44]
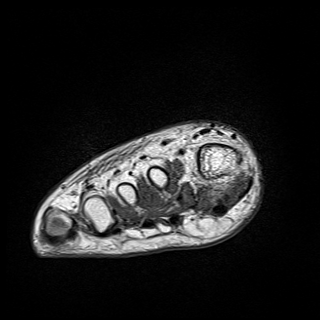
[im 35/44]
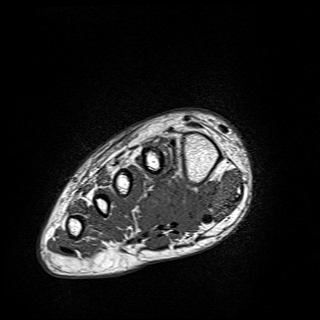
[im 44/44]
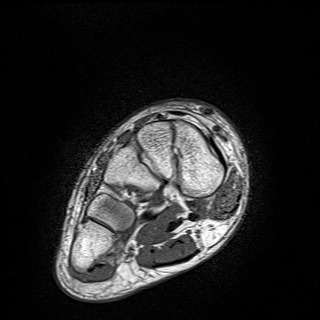

[Series 7: ax t1_w · coronal · right · 3.0mm · 0.38mm/px · 6 of 44 slices shown]
[im 1/44]
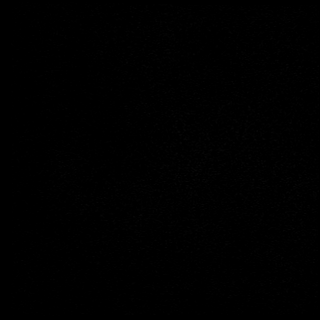
[im 9/44]
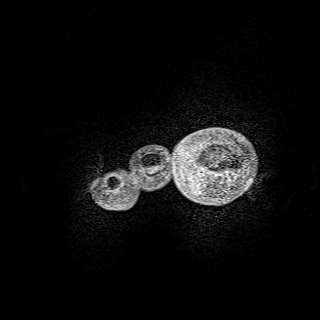
[im 18/44]
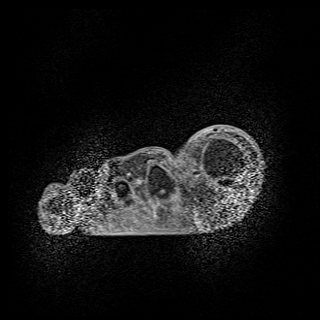
[im 26/44]
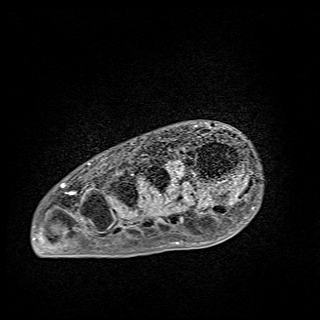
[im 35/44]
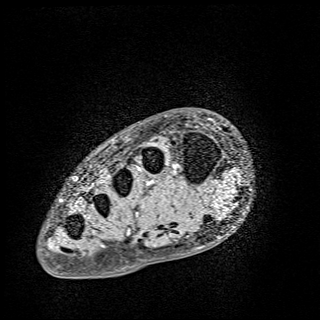
[im 44/44]
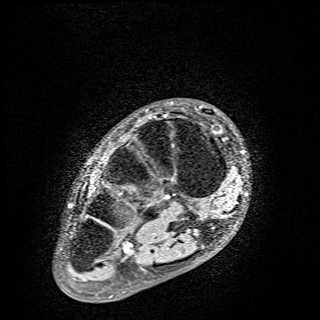

[Series 8: T1 · axial · right · 3.0mm · 0.78mm/px · z∈[-147,-76]mm · 3 of 20 slices shown]
[im 1/20]
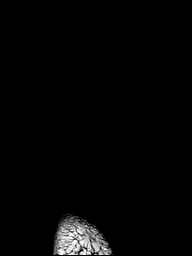
[im 10/20]
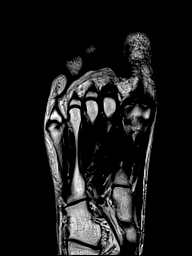
[im 20/20]
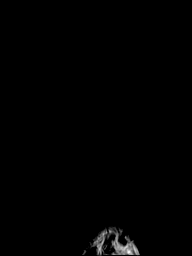

[Series 10: T2 · axial · right · 3.0mm · 0.78mm/px · z∈[-147,-76]mm · 3 of 20 slices shown (2 of 2)]
[im 1/20]
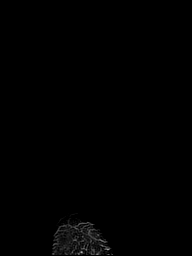
[im 10/20]
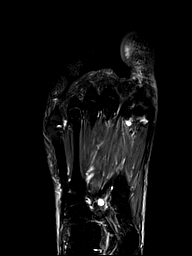
[im 20/20]
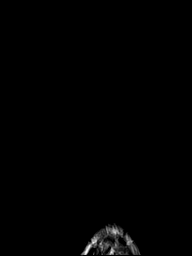

[Series 11: STIR · sagittal · right · 3.0mm · 0.62mm/px · 4 of 28 slices shown]
[im 1/28]
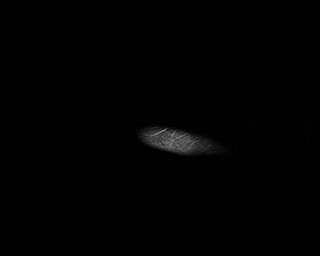
[im 10/28]
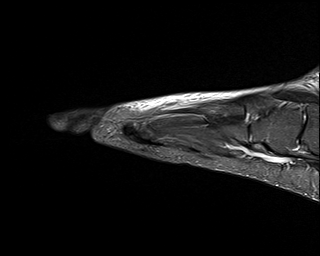
[im 19/28]
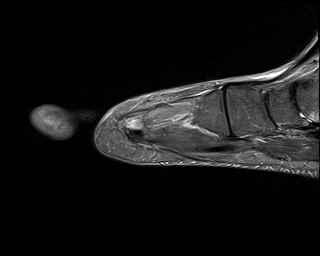
[im 28/28]
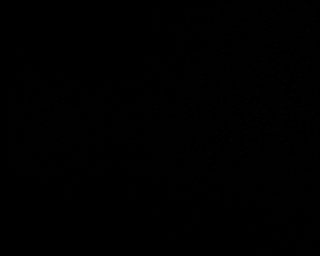

[Series 13: T1 fat-sat · coronal · right · 3.0mm · 0.38mm/px · 6 of 44 slices shown (1 of 3)]
[im 1/44]
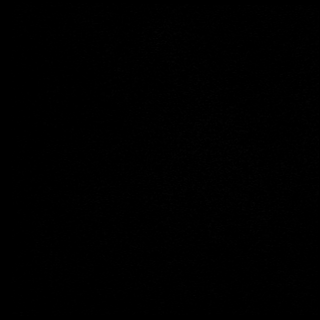
[im 9/44]
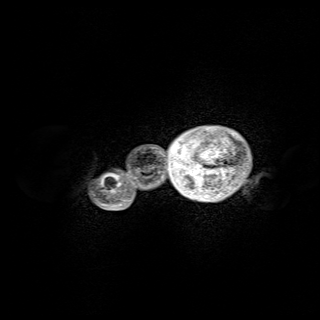
[im 18/44]
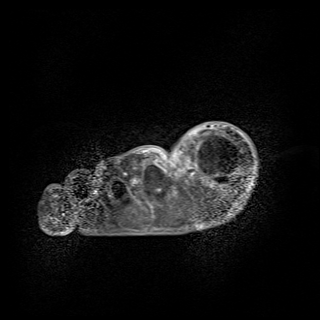
[im 26/44]
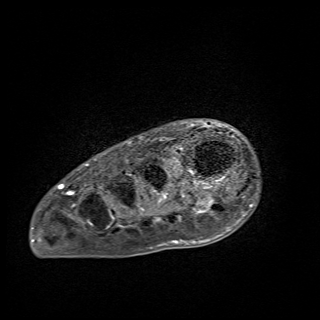
[im 35/44]
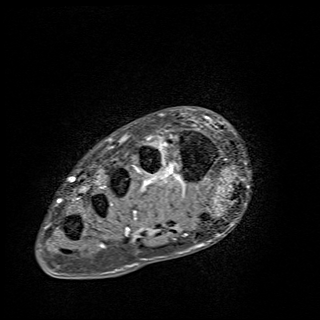
[im 44/44]
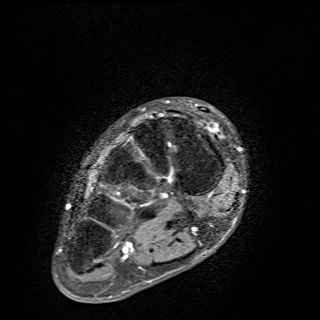

[Series 15: T1 fat-sat · axial · right · 3.0mm · 0.78mm/px · z∈[-147,-76]mm · 3 of 20 slices shown (2 of 3)]
[im 1/20]
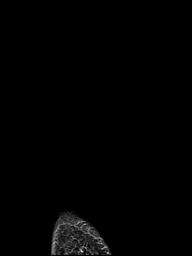
[im 10/20]
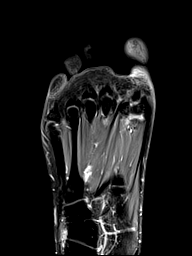
[im 20/20]
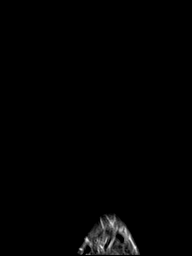

[Series 16: T1 fat-sat · sagittal · right · 3.0mm · 0.62mm/px · 4 of 29 slices shown (3 of 3)]
[im 1/29]
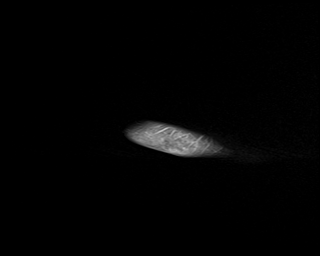
[im 10/29]
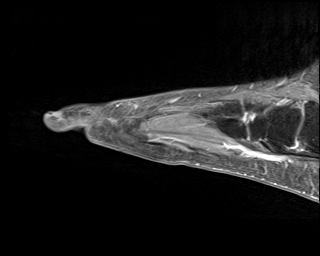
[im 19/29]
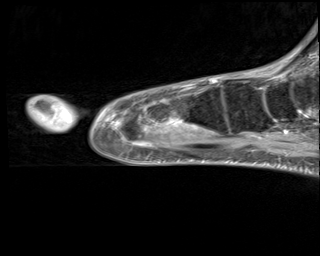
[im 29/29]
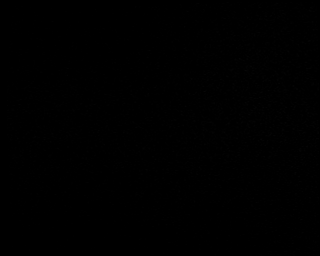

[40 of 40 positions shown; findings below may reference images not displayed]

FINDINGS: Bones/Joint/Cartilage

Diffuse marrow edema in the distal phalanx of the great toe with
lateral cortical demineralization in the tuft and an adjacent 1.4 by
0.5 by 0.9 cm abscess in the soft tissues just lateral to the distal
phalanx. The appearance is compatible with active osteomyelitis of
the distal phalanx great toe. No other bony involvement identified.

Ligaments

The Lisfranc ligament appears intact.

Muscles and Tendons

Unremarkable

Soft tissues

Cellulitis of the great toe especially medially. Small abscess of
the medial great toe, questionable extension to the right nail bed
margin. Dorsal subcutaneous edema in the forefoot without associated
enhancement.
IMPRESSION: 1. Osteomyelitis of the distal phalanx great toe, with diffuse
marrow edema and cortical demineralization in the lateral tuft.
2. 1.4 by 0.5 by 0.9 cm abscess laterally in the great toe,
extending towards the right nail bed margin.
3. Cellulitis of the great toe.
4. Dorsal subcutaneous edema in the forefoot.

## 2021-11-08 MED ORDER — VANCOMYCIN HCL 1250 MG/250ML IV SOLN
1250.0000 mg | Freq: Two times a day (BID) | INTRAVENOUS | Status: DC
  Administered 2021-11-09 – 2021-11-11 (×5): 1250 mg via INTRAVENOUS
  Filled 2021-11-08 (×6): qty 250

## 2021-11-08 MED ORDER — INSULIN ASPART 100 UNIT/ML IJ SOLN
0.0000 [IU] | Freq: Every day | INTRAMUSCULAR | Status: DC
  Administered 2021-11-09: 4 [IU] via SUBCUTANEOUS
  Filled 2021-11-08: qty 1

## 2021-11-08 MED ORDER — ONDANSETRON HCL 4 MG/2ML IJ SOLN
4.0000 mg | Freq: Once | INTRAMUSCULAR | Status: AC
  Administered 2021-11-08: 4 mg via INTRAVENOUS
  Filled 2021-11-08: qty 2

## 2021-11-08 MED ORDER — INSULIN ASPART 100 UNIT/ML IJ SOLN
0.0000 [IU] | Freq: Three times a day (TID) | INTRAMUSCULAR | Status: DC
  Administered 2021-11-09: 3 [IU] via SUBCUTANEOUS
  Filled 2021-11-08: qty 1

## 2021-11-08 MED ORDER — SODIUM CHLORIDE 0.9 % IV BOLUS
1000.0000 mL | Freq: Once | INTRAVENOUS | Status: AC
  Administered 2021-11-08: 1000 mL via INTRAVENOUS

## 2021-11-08 MED ORDER — SODIUM CHLORIDE 0.9 % IV SOLN: INTRAVENOUS | Status: AC

## 2021-11-08 MED ORDER — ONDANSETRON HCL 4 MG/2ML IJ SOLN: 4.0000 mg | Freq: Four times a day (QID) | INTRAMUSCULAR | Status: DC | PRN

## 2021-11-08 MED ORDER — SODIUM CHLORIDE 0.9 % IV SOLN
2.0000 g | Freq: Three times a day (TID) | INTRAVENOUS | Status: DC
  Administered 2021-11-08 – 2021-11-11 (×8): 2 g via INTRAVENOUS
  Filled 2021-11-08: qty 12.5
  Filled 2021-11-08: qty 2
  Filled 2021-11-08 (×8): qty 12.5

## 2021-11-08 MED ORDER — OXYCODONE-ACETAMINOPHEN 5-325 MG PO TABS
1.0000 | ORAL_TABLET | Freq: Four times a day (QID) | ORAL | Status: AC | PRN
  Administered 2021-11-09 – 2021-11-10 (×4): 1 via ORAL
  Filled 2021-11-08 (×4): qty 1

## 2021-11-08 MED ORDER — HEPARIN SODIUM (PORCINE) 5000 UNIT/ML IJ SOLN
5000.0000 [IU] | Freq: Three times a day (TID) | INTRAMUSCULAR | Status: AC
  Administered 2021-11-08: 5000 [IU] via SUBCUTANEOUS
  Filled 2021-11-08: qty 1

## 2021-11-08 MED ORDER — INSULIN GLARGINE-YFGN 100 UNIT/ML ~~LOC~~ SOLN
50.0000 [IU] | Freq: Every day | SUBCUTANEOUS | Status: DC
  Administered 2021-11-08 – 2021-11-10 (×3): 50 [IU] via SUBCUTANEOUS
  Filled 2021-11-08 (×4): qty 0.5

## 2021-11-08 MED ORDER — LORAZEPAM 2 MG/ML IJ SOLN
2.0000 mg | INTRAMUSCULAR | Status: AC | PRN
  Administered 2021-11-10 (×2): 2 mg via INTRAVENOUS
  Filled 2021-11-08 (×3): qty 1

## 2021-11-08 MED ORDER — VANCOMYCIN HCL IN DEXTROSE 1-5 GM/200ML-% IV SOLN
1000.0000 mg | Freq: Once | INTRAVENOUS | Status: AC
  Administered 2021-11-08: 1000 mg via INTRAVENOUS
  Filled 2021-11-08 (×2): qty 200

## 2021-11-08 MED ORDER — ONDANSETRON HCL 4 MG PO TABS
4.0000 mg | ORAL_TABLET | Freq: Four times a day (QID) | ORAL | Status: DC | PRN
Start: 2021-11-08 — End: 2021-11-11

## 2021-11-08 MED ORDER — ACETAMINOPHEN 650 MG RE SUPP: 650.0000 mg | Freq: Four times a day (QID) | RECTAL | Status: DC | PRN

## 2021-11-08 MED ORDER — MORPHINE SULFATE (PF) 4 MG/ML IV SOLN
4.0000 mg | Freq: Once | INTRAVENOUS | Status: AC
  Administered 2021-11-08: 4 mg via INTRAVENOUS
  Filled 2021-11-08: qty 1

## 2021-11-08 MED ORDER — LEVETIRACETAM 500 MG PO TABS
500.0000 mg | ORAL_TABLET | Freq: Two times a day (BID) | ORAL | Status: DC
  Administered 2021-11-09 – 2021-11-11 (×6): 500 mg via ORAL
  Filled 2021-11-08 (×6): qty 1

## 2021-11-08 MED ORDER — MORPHINE SULFATE (PF) 2 MG/ML IV SOLN
2.0000 mg | INTRAVENOUS | Status: AC | PRN
  Administered 2021-11-10 (×3): 2 mg via INTRAVENOUS
  Filled 2021-11-08 (×3): qty 1

## 2021-11-08 MED ORDER — GADOBUTROL 1 MMOL/ML IV SOLN
6.0000 mL | Freq: Once | INTRAVENOUS | Status: AC | PRN
  Administered 2021-11-08: 6 mL via INTRAVENOUS
  Filled 2021-11-08: qty 6

## 2021-11-08 MED ORDER — ACETAMINOPHEN 325 MG PO TABS
650.0000 mg | ORAL_TABLET | Freq: Four times a day (QID) | ORAL | Status: DC | PRN
Start: 2021-11-08 — End: 2021-11-11
  Administered 2021-11-11 (×2): 650 mg via ORAL
  Filled 2021-11-08 (×2): qty 2

## 2021-11-08 NOTE — Assessment & Plan Note (Deleted)
-   Continue cefepime and vancomycin per pharmacy ?- N.p.o. after midnight pending podiatry evaluation ?- Epic order placed to Dr. Posey Pronto for inpatient podiatry ?

## 2021-11-08 NOTE — Assessment & Plan Note (Signed)
-   Corrected serum sodium is 137, [place] 2001 ?-BMP in the a.m. ?

## 2021-11-08 NOTE — ED Notes (Signed)
Pt drinking diet drink.  Iv fluids infusing. ?

## 2021-11-08 NOTE — Consult Note (Signed)
Pharmacy Antibiotic Note ? ?Jesus Ewing is a 21 y.o. male admitted on 11/08/2021 with  osteomyelitis .  Pharmacy has been consulted for cefepime and vancomycin dosing. ? ?Plan: ?Will start cefepime 2 g q8H ? ?Pt received vancomycin 1 g x 1 in the ED followed by 1250 mg q12H. Predicted AUC 546. Goal AUC 400-550 (Scr 0.8, VD 0.72, TBW). Plan to obtain vancomycin level after the 4th or 5th dose.  ? ?Height: 5\' 10"  (177.8 cm) ?Weight: 60.1 kg (132 lb 7.9 oz) ?IBW/kg (Calculated) : 73 ? ?Temp (24hrs), Avg:98.4 ?F (36.9 ?C), Min:98.4 ?F (36.9 ?C), Max:98.4 ?F (36.9 ?C) ? ?Recent Labs  ?Lab 11/08/21 ?1556 11/08/21 ?1711  ?WBC 6.7  --   ?CREATININE 0.70  --   ?LATICACIDVEN 1.1 5.4*  ?  ?Estimated Creatinine Clearance: 122.1 mL/min (by C-G formula based on SCr of 0.7 mg/dL).   ? ?No Known Allergies ? ?Antimicrobials this admission: ?4/25 cefepime >>  ?4/25 vancomycin >>  ? ?Dose adjustments this admission: ?None ? ?Microbiology results: ?4/25 BCx: pending ? ? ?Thank you for allowing pharmacy to be a part of this patient?s care. ? ?5/25, PharmD, BCPS ?11/08/2021 7:06 PM ? ?

## 2021-11-08 NOTE — ED Triage Notes (Signed)
Pt to ED via POV from home. Pt reports right big toe infection that has been treated with antibiotics and has had toe nail removed. Pt seen at podiatrist this morning and was referred to ED for evaluation due to infection getting worse. Pt hx DM and poorly controlled sugars. ? ?Dr. Posey Pronto recommendations is to get IV antibiotics and MRI of toe.  ? ?

## 2021-11-08 NOTE — Progress Notes (Signed)
?Subjective:  ?Patient ID: Jesus Ewing, male    DOB: [birth year],  MRN: [id number] ? ?Chief Complaint  ?Patient presents with  ? Ingrown Toenail  ? ? ?21 y.o. male presents with the above complaint.  Patient presents with right lateral border ingrown.  Patient states that there is redness associated with it that is causing him a lot of pain.  He went to the urgent care and was immediately sent here to have an ingrown nail removed and check for infection.  He was on doxycycline and has not helped decrease the redness.  He is a diabetic with uncontrolled A1c with last A1c of 9.7.  He is a high risk of losing the toe versus the leg. ? ? ?Review of Systems: Negative except as noted in the HPI. Denies N/V/F/Ch. ? ?Past Medical History:  ?Diagnosis Date  ? Celiac disease   ? Diabetes mellitus without complication (HCC)   ? ? ?Current Outpatient Medications:  ?  sulfamethoxazole-trimethoprim (BACTRIM DS) 800-160 MG tablet, Take 1 tablet by mouth 2 (two) times daily for 14 days., Disp: 28 tablet, Rfl: 0 ?  doxycycline (VIBRAMYCIN) 100 MG capsule, Take 100 mg by mouth 2 (two) times daily., Disp: , Rfl:  ?  insulin lispro (HUMALOG) 100 UNIT/ML KwikPen, Inject 0-50 Units into the skin as directed., Disp: , Rfl:  ?  LANTUS SOLOSTAR 100 UNIT/ML Solostar Pen, Inject 50 Units into the skin at bedtime., Disp: , Rfl:  ?  levETIRAcetam (KEPPRA) 500 MG tablet, Take 1 tablet (500 mg total) by mouth 2 (two) times daily., Disp: 60 tablet, Rfl: 0 ? ?Social History  ? ?Tobacco Use  ?Smoking Status Never  ?Smokeless Tobacco Never  ? ? ?No Known Allergies ?Objective:  ?There were no vitals filed for this visit. ?There is no height or weight on file to calculate BMI. ?Constitutional Well developed. ?Well nourished.  ?Vascular Dorsalis pedis pulses palpable bilaterally. ?Posterior tibial pulses palpable bilaterally. ?Capillary refill normal to all digits.  ?No cyanosis or clubbing noted. ?Pedal hair growth normal.  ?Neurologic Normal  speech. ?Oriented to person, place, and time. ?Epicritic sensation to light touch grossly present bilaterally.  ?Dermatologic Painful ingrowing nail at lateral nail borders of the hallux nail right.  With erythema and paronychia.  No purulent drainage noted.  No malodor present ?No other open wounds. ?No skin lesions.  ?Orthopedic: Normal joint ROM without pain or crepitus bilaterally. ?No visible deformities. ?No bony tenderness.  ? ?Radiographs: None ?Assessment:  ? ?1. Severe hyperglycemia due to diabetes mellitus (HCC)   ?2. Ingrown toenail of right foot   ? ?Plan:  ?Patient was evaluated and treated and all questions answered. ? ?Ingrown Nail, right ?-Patient elects to proceed with minor surgery to remove ingrown toenail removal today. Consent reviewed and signed by patient. ?-Ingrown nail excised. See procedure note. ?-Educated on post-procedure care including soaking. Written instructions provided and reviewed. ?-Patient to follow up in 2 weeks for nail check. ?-He is a diabetic and a high risk of losing the digit.  Given the amount of infection that is present I believe patient will benefit from removal of the ingrown to drain the infection out.  I discussed with the patient given his A1c he is a high risk of losing that digit.  He states understanding. ? ?Procedure: Excision of Ingrown Toenail without phenol matricectomy due to infection ?Location: Right 1st toe lateral nail borders. ?Anesthesia: Lidocaine 1% plain; 1.5 mL and Marcaine 0.5% plain; 1.5 mL, digital block. ?Skin Prep: Betadine. ?  Dressing: Silvadene; telfa; dry, sterile, compression dressing. ?Technique: Following skin prep, the toe was exsanguinated and a tourniquet was secured at the base of the toe. The affected nail border was freed, split with a nail splitter, and excised.  No chemical matricectomy was done with phenol.  The tourniquet was then removed and sterile dressing applied. ?Disposition: Patient tolerated procedure well. Patient to  return in 2 weeks for follow-up.  ? ?No follow-ups on file. ?

## 2021-11-08 NOTE — Assessment & Plan Note (Signed)
-   Resumed home Keppra 500 mg p.o. twice daily ?- Ativan 2 mg IV as needed for seizures and anxiety, 2 doses ordered, with instructions for nursing staff to administer within the parameters and then let provider know ?

## 2021-11-08 NOTE — Assessment & Plan Note (Addendum)
Treated with cefepime and vancomycin per pharmacy while inpatient ?Underwent amputation 11/10/2021 and was discharged home on p.o. antibiotics with 1 week Bactrim ?Blood cultures x2 ?

## 2021-11-08 NOTE — Progress Notes (Signed)
?  Subjective:  ?Patient ID: Jesus Ewing, male    DOB: Jun 11, 2001,  MRN: [id number] ? ?Chief Complaint  ?Patient presents with  ? Ingrown Toenail  ?  Possible infection from ingrown procedure   ? ? ?20 y.o. male presents with the above complaint.  Patient presents with worsening redness from right hallux ingrown that was removed by me.  He states that he has failed now Bactrim and doxycycline and the redness seems to be getting worse.  Given that he is an uncontrolled diabetic he is at high risk of losing that digit.  I discussed with the patient.  He states that there is no purulent drainage she is not having any systemic signs of infection.  His last A1c was 9.7. ? ? ?Review of Systems: Negative except as noted in the HPI. Denies N/V/F/Ch. ? ?Past Medical History:  ?Diagnosis Date  ? Celiac disease   ? Diabetes mellitus without complication (HCC)   ? ? ?Current Outpatient Medications:  ?  doxycycline (VIBRAMYCIN) 100 MG capsule, Take 100 mg by mouth 2 (two) times daily., Disp: , Rfl:  ?  insulin lispro (HUMALOG) 100 UNIT/ML KwikPen, Inject 0-50 Units into the skin as directed., Disp: , Rfl:  ?  LANTUS SOLOSTAR 100 UNIT/ML Solostar Pen, Inject 50 Units into the skin at bedtime., Disp: , Rfl:  ?  levETIRAcetam (KEPPRA) 500 MG tablet, Take 1 tablet (500 mg total) by mouth 2 (two) times daily., Disp: 60 tablet, Rfl: 0 ?  sulfamethoxazole-trimethoprim (BACTRIM DS) 800-160 MG tablet, Take 1 tablet by mouth 2 (two) times daily for 14 days., Disp: 28 tablet, Rfl: 0 ? ?Social History  ? ?Tobacco Use  ?Smoking Status Never  ?Smokeless Tobacco Never  ? ? ?No Known Allergies ?Objective:  ?There were no vitals filed for this visit. ?There is no height or weight on file to calculate BMI. ?Constitutional Well developed. ?Well nourished.  ?Vascular Dorsalis pedis pulses palpable bilaterally. ?Posterior tibial pulses palpable bilaterally. ?Capillary refill normal to all digits.  ?No cyanosis or clubbing noted. ?Pedal hair  growth normal.  ?Neurologic Normal speech. ?Oriented to person, place, and time. ?Epicritic sensation to light touch grossly present bilaterally.  ?Dermatologic Paronychia noted noted to the right hallux without any purulent drainage.  Pain on palpation to the toe.  Redness up to the MPJ.  ?Orthopedic: Normal joint ROM without pain or crepitus bilaterally. ?No visible deformities. ?No bony tenderness.  ? ?Radiographs: None ?Assessment:  ? ?1. Severe hyperglycemia due to diabetes mellitus (HCC)   ?2. Ingrown toenail of right foot   ?3. Cellulitis and abscess of toe of right foot   ? ?Plan:  ?Patient was evaluated and treated and all questions answered. ? ?Right hallux paronychia with a history of ingrown removal ?-All questions and concerns were discussed with the patient. ?-I had attempted to drain the infection in clinic and placed him on Bactrim however he has failed antibiotics and the infection appears to be getting worse.  At this point I believe patient will benefit from admission to the hospital for IV antibiotics broad-spectrum.  He will need 48 to 72 hours of IV antibiotics ?-He will also benefit from an MRI to the right toe to assess for an abscess ?-Betadine wet-to-dry dressing to the digit ?-He is a high risk of losing that digit versus the foot. ?-I he will also benefit from better glucose management as his last A1c was 9.7. ? ?No follow-ups on file. ?

## 2021-11-08 NOTE — ED Notes (Signed)
Pt in MRI. Pt mother bedside ?

## 2021-11-08 NOTE — H&P (Addendum)
?History and Physical  ? ?Jesus Ewing VVZ:[id number] DOB: [birth year] DOA: 11/08/2021 ? ?PCP: System, Provider Not In  ?Outpatient Specialists: Dr. Posey Pronto, podiatry ?Patient coming from: Home ? ?I have personally briefly reviewed patient's old medical records in [place]. ? ?Chief Concern: Right first toe infection ? ?HPI: [name] is a 21 year old male with history of insulin-dependent diabetes mellitus, history of seizures, who presents to the emergency department from home for chief concerns of big toe infection at the advice of his podiatrist. ? ?Initial vitals in the emergency department showed temperature of 98.4, respiration rate of 20, blood pressure, heart rate of 106, SPO2 of 96% on room air. ? ?Serum sodium was 130, potassium 3.9, chloride 96, bicarb of 22, nonfasting blood glucose 377, BUN of 20, serum creatinine of 0.70, GFR greater than 60, lactic acid was 1.1 and increased to 5.4, WBC 6.7, hemoglobin 13.6, platelets of 302. ? ?ED treatment: Morphine 4 mg IV, ondansetron 4 mg IV, cefepime and vancomycin per pharmacy, sodium chloride 1 L bolus. ? ?At bedside patient was Aaox4, he states that his right first toe has been bothering him for about 3 weeks.  He states that it started with an ingrown toe nail.  He has received 2 rounds of antibiotics, The first 1 was about 2 weeks ago.  He also received another round of antibiotics about 1 week ago.  He endorses compliance with antibiotic.  He states that since the last 2 weeks he has used cocaine a couple of times. ? ?I counseled patient's about the use of cocaine causes vessel ischemia which puts him at risk for stroke, heart attacks and reduces his body's ability to heal. ? ?He does not seem to understand or appreciates the severity of the issue.  He also endorses that he vapes nicotine daily.  He is not ready to quit. ? ?Social history: He lives in a tiny house in his mom's backyard. He vapes nicotine daily. He infrequently drinks  etoh, last drink was two+ weeks ago. He occasionally uses cocaine. He owns a tobacco shop.  ? ?Vaccination history: He is not vaccinated for covid and influenza.  ? ?ROS: ?Constitutional: no weight change, no fever ?ENT/Mouth: no sore throat, no rhinorrhea ?Eyes: no eye pain, no vision changes ?Cardiovascular: no chest pain, no dyspnea,  no edema, no palpitations ?Respiratory: no cough, no sputum, no wheezing ?Gastrointestinal: no nausea, no vomiting, no diarrhea, no constipation ?Genitourinary: no urinary incontinence, no dysuria, no hematuria ?Musculoskeletal: no arthralgias, no myalgias ?Skin: no skin lesions, no pruritus, right toe wound ?Neuro: no weakness, no loss of consciousness, no syncope ?Psych: no anxiety, no depression, no decrease appetite, no SI, no HI ?Heme/Lymph: no bruising, no bleeding ? ?ED Course: Discussed with emergency medicine provider, patient requiring hospitalization for chief concerns of osteomyelitis of the right first toe. ? ?Assessment/Plan ? ?Principal Problem: ?  Sepsis ([place]) ?Active Problems: ?  Seizure ([place]) ?  Depression ?  Severe hyperglycemia due to diabetes mellitus ([place]) ?  Osteomyelitis ([place]) ?  Hyponatremia ?  ?Assessment and Plan: ? ?* Sepsis ([place]) ?- Patient met sepsis criteria with elevated heart rate initially, elevated lactic acid of 5.4, source of right first toe infection concerning for abscess versus osteomyelitis ?- On clinical exam patient is not presenting like sepsis ?- Continue cefepime and vancomycin per pharmacy ?- Blood cultures x2 for collection have been ordered ?- Status post sodium chloride 1 L bolus ?- Patient maintaining appropriate MAP ?- Ordered sodium chloride 125  mL/h, 10 hours ordered ? ?Hyponatremia ?- Corrected serum sodium is 137, [place] 1999 ?-BMP in the a.m. ? ?Osteomyelitis ([place]) ?- Continue cefepime and vancomycin per pharmacy ?- N.p.o. after midnight pending podiatry evaluation ?- Epic order placed to Dr. Posey Pronto for inpatient  podiatry ? ?Seizure ([place]) ?- Resumed home Keppra 500 mg p.o. twice daily ?- Ativan 2 mg IV as needed for seizures and anxiety, 2 doses ordered, with instructions for nursing staff to administer within the parameters and then let provider know ? ?DVT prophylaxis-I have ordered 1 dose of heparin 5000 units subcutaneous due to anticipation of procedure. ?- A.m. team to resume pharmacologic DVT prophylaxis when its benefits outweigh its risk of bleeding ? ?Chart reviewed.  ? ?Hospitalization from 10/31/2021 to 11/01/2021: He was hospitalized for DKA, DKA protocol was initiated.  So had acute kidney injury, presumed prerenal and resolved with hydration. ? ?DVT prophylaxis: Heparin 5000 units subcutaneous every 8 hours, 1 dose ordered ?Code Status: full code ?Diet: Diabetic/carb modified diet, n.p.o. after midnight ?Family Communication: No ?Disposition Plan: Pending clinical course ?Consults called: Podiatry ?Admission status: MedSurg, observation ? ?Past Medical History:  ?Diagnosis Date  ? Celiac disease   ? Diabetes mellitus without complication ([place])   ? ?Past Surgical History:  ?Procedure Laterality Date  ? NO PAST SURGERIES    ? ?Social History:  reports that he has never smoked. He has never used smokeless tobacco. He reports current alcohol use. He reports current drug use. Drug: Cocaine. ? ?No Known Allergies ?History reviewed. No pertinent family history. ? ?Family history: Family history has been reviewed with patient at bedside and not pertinent ? ?Prior to Admission medications   ?Medication Sig Start Date End Date Taking? Authorizing Provider  ?doxycycline (VIBRAMYCIN) 100 MG capsule Take 100 mg by mouth 2 (two) times daily. 10/25/21  Yes [provider]  ?insulin lispro (HUMALOG) 100 UNIT/ML KwikPen Inject 0-50 Units into the skin as directed. 07/24/18  Yes [provider]  ?LANTUS SOLOSTAR 100 UNIT/ML Solostar Pen Inject 50 Units into the skin at bedtime. 05/04/19  Yes [provider]  ?sulfamethoxazole-trimethoprim (BACTRIM DS) 800-160 MG tablet Take 1 tablet by mouth 2 (two) times daily for 14 days. 11/03/21 11/17/21 Yes Felipa Furnace, DPM  ?levETIRAcetam (KEPPRA) 500 MG tablet Take 1 tablet (500 mg total) by mouth 2 (two) times daily. 07/05/21 08/04/21  Lorella Nimrod, MD  ? ?Physical Exam: ?Vitals:  ? 11/08/21 1554 11/08/21 1602 11/08/21 1815 11/08/21 2323  ?BP:  127/84 130/81 126/70  ?Pulse:  (!) 106 (!) 105 99  ?Resp:  _0 ?Temp:  98.4 ?F (36.9 ?C)  98.1 ?F (36.7 ?C)  ?TempSrc:  Oral  Oral  ?SpO2:  96% 98% 98%  ?Weight: 60.1 kg     ?Height: _1  (1.778 m)     ? ?Constitutional: appears age-appropriate, NAD, calm, comfortable ?Eyes: PERRL, lids and conjunctivae normal ?ENMT: Mucous membranes are moist. Posterior pharynx clear of any exudate or lesions. Age-appropriate dentition. Hearing appropriate ?Neck: normal, supple, no masses, no thyromegaly ?Respiratory: clear to auscultation bilaterally, no wheezing, no crackles. Normal respiratory effort. No accessory muscle use.  ?Cardiovascular: Regular rate and rhythm, no murmurs / rubs / gallops. No extremity edema. 2+ pedal pulses. No carotid bruits.  ?Abdomen: no tenderness, no masses palpated, no hepatosplenomegaly. Bowel sounds positive.  ?Musculoskeletal: no clubbing / cyanosis. No joint deformity upper and lower extremities. Good ROM, no contractures, no atrophy. Normal muscle tone.  ?Skin: + rashes, lesions, ulcers.  No induration.  ? ? ? ?Neurologic: Sensation intact. Strength 5/5 in all 4.  ?Psychiatric: Normal judgment and insight. Alert and oriented x 3. Normal mood.  ? ?EKG: Not indicated at this time ? ?Chest x-ray on Admission: no indicated ? ?MR TOES RIGHT W WO CONTRAST ? ?Result Date: 11/08/2021 ?CLINICAL DATA:  Erythema and pain in the great toe.  Diabetes. EXAM: MRI OF THE RIGHT TOES WITHOUT AND WITH CONTRAST TECHNIQUE: Multiplanar, multisequence MR imaging of the right forefoot/toes was performed both before and after  administration of intravenous contrast. CONTRAST:  68m GADAVIST GADOBUTROL 1 MMOL/ML IV SOLN COMPARISON:  None. FINDINGS: Bones/Joint/Cartilage Diffuse marrow edema in the distal phalanx of the great toe with lateral

## 2021-11-08 NOTE — Hospital Course (Addendum)
[name] is a 21 year old male with history of insulin-dependent diabetes mellitus, history of seizures, who presents to the emergency department 11/08/2021 from home for chief concerns of big toe infection at the advice of his podiatrist.  Tachycardic but otherwise vital signs stable.  Hyperglycemic, lactic acid 1.1, WBC 6.7.  Patient was given morphine, Zofran, vancomycin in ED and admitted to hospital for treatment of osteomyelitis.  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

## 2021-11-08 NOTE — ED Provider Notes (Signed)
? ?[facility] ?Provider Note ? ? ? Event Date/Time  ? First MD Initiated Contact with Patient 11/08/21 1621   ?  (approximate) ? ?History  ? ?Chief Complaint: Wound Infection (Right big toe ) ? ?HPI ? ?Jesus Ewing is a 21 y.o. male with a past medical history of diabetes who presents to the emergency department for right great toe infection.  According to the patient for the past 2+ weeks he has been experiencing right great toe infection which has been treated by podiatry at [facility] by Dr. Posey Pronto.  Patient has been on antibiotics for the past 2 weeks and the infection appears to be worsening.  As the patient is diabetic with worsening infection despite outpatient therapy he was referred to the emergency department for an MRI of the toe and admission for IV antibiotics per patient.  I reviewed the patient's last discharge summary which was from approximately 1 week ago/18/23 at that time the patient was admitted for diabetic ketoacidosis ? ?Physical Exam  ? ?Triage Vital Signs: ?ED Triage Vitals  ?Enc Vitals Group  ?   BP 11/08/21 1602 127/84  ?   Pulse Rate 11/08/21 1602 (!) 106  ?   Resp 11/08/21 1602 20  ?   Temp 11/08/21 1602 98.4 ?F (36.9 ?C)  ?   Temp Source 11/08/21 1602 Oral  ?   SpO2 11/08/21 1602 96 %  ?   Weight 11/08/21 1554 132 lb 7.9 oz (60.1 kg)  ?   Height 11/08/21 1554 5\' 10"  (1.778 m)  ?   Head Circumference --   ?   Peak Flow --   ?   Pain Score 11/08/21 1554 6  ?   Pain Loc --   ?   Pain Edu? --   ?   Excl. in [place]? --   ? ? ?Most recent vital signs: ?Vitals:  ? 11/08/21 1602  ?BP: 127/84  ?Pulse: (!) 106  ?Resp: 20  ?Temp: 98.4 ?F (36.9 ?C)  ?SpO2: 96%  ? ? ?General: Awake, no distress.  ?CV:  Good peripheral perfusion.  Regular rate and rhythm  ?Resp:  Normal effort.  Equal breath sounds bilaterally.  ?Abd:  No distention.  Soft, nontender.  No rebound or guarding. ?Other:  Moderate swelling with moderate tenderness to palpation of the right great toe  consistent with cellulitis possible abscess/paronychia as well. ? ? ?ED Results / Procedures / Treatments  ? ?RADIOLOGY ? ?MRI is consistent with osteomyelitis as well as abscess ? ? ?MEDICATIONS ORDERED IN ED: ?Medications  ?vancomycin (VANCOCIN) IVPB 1000 mg/200 mL premix (has no administration in time range)  ? ? ? ?IMPRESSION / MDM / ASSESSMENT AND PLAN / ED COURSE  ?I reviewed the triage vital signs and the nursing notes. ? ?Patient presents emergency department for right great toe infection swelling pain despite 2 weeks of oral antibiotics by podiatry referred to the ED for an MRI and admission for IV antibiotics.  We will check cultures, start the patient on IV vancomycin.  We will obtain an MRI of the toe in addition to basic labs.  Patient agreeable to plan of care ? ?Patient lactate is 5.4, after normal saline fluid bolus lactate is down to 1.1.  Chemistry shows no significant findings besides hyperglycemia patient is a known diabetic, normal white blood cell count with a reassuring CBC.  MRI has resulted positive for osteomyelitis.  As well as an abscess.  We will admit to the hospital  service they will consult podiatry.  Patient agreeable to plan of care. ? ?FINAL CLINICAL IMPRESSION(S) / ED DIAGNOSES  ? ?Right great toe infection ?Osteomyelitis ? ? ? ?Note:  This document was prepared using Dragon voice recognition software and may include unintentional dictation errors. ?  ?Harvest Dark, MD ?11/08/21 1939 ? ?

## 2021-11-09 ENCOUNTER — Encounter: Payer: Self-pay | Admitting: Internal Medicine

## 2021-11-09 DIAGNOSIS — F32A Depression, unspecified: Secondary | ICD-10-CM | POA: Diagnosis present

## 2021-11-09 DIAGNOSIS — M869 Osteomyelitis, unspecified: Secondary | ICD-10-CM | POA: Diagnosis present

## 2021-11-09 DIAGNOSIS — Z28310 Unvaccinated for covid-19: Secondary | ICD-10-CM | POA: Diagnosis not present

## 2021-11-09 DIAGNOSIS — K900 Celiac disease: Secondary | ICD-10-CM | POA: Diagnosis present

## 2021-11-09 DIAGNOSIS — F1490 Cocaine use, unspecified, uncomplicated: Secondary | ICD-10-CM | POA: Diagnosis present

## 2021-11-09 DIAGNOSIS — E1065 Type 1 diabetes mellitus with hyperglycemia: Secondary | ICD-10-CM | POA: Diagnosis present

## 2021-11-09 DIAGNOSIS — E871 Hypo-osmolality and hyponatremia: Secondary | ICD-10-CM | POA: Diagnosis present

## 2021-11-09 DIAGNOSIS — A419 Sepsis, unspecified organism: Secondary | ICD-10-CM | POA: Diagnosis not present

## 2021-11-09 DIAGNOSIS — M86671 Other chronic osteomyelitis, right ankle and foot: Secondary | ICD-10-CM | POA: Diagnosis not present

## 2021-11-09 DIAGNOSIS — F17290 Nicotine dependence, other tobacco product, uncomplicated: Secondary | ICD-10-CM | POA: Diagnosis present

## 2021-11-09 DIAGNOSIS — Z79899 Other long term (current) drug therapy: Secondary | ICD-10-CM | POA: Diagnosis not present

## 2021-11-09 DIAGNOSIS — E1069 Type 1 diabetes mellitus with other specified complication: Secondary | ICD-10-CM | POA: Diagnosis present

## 2021-11-09 DIAGNOSIS — G4089 Other seizures: Secondary | ICD-10-CM | POA: Diagnosis present

## 2021-11-09 DIAGNOSIS — E1169 Type 2 diabetes mellitus with other specified complication: Secondary | ICD-10-CM | POA: Diagnosis not present

## 2021-11-09 DIAGNOSIS — Z794 Long term (current) use of insulin: Secondary | ICD-10-CM | POA: Diagnosis not present

## 2021-11-09 LAB — BASIC METABOLIC PANEL
Anion gap: 6 (ref 5–15)
Anion gap: 8 (ref 5–15)
BUN: 12 mg/dL (ref 6–20)
BUN: 26 mg/dL — ABNORMAL HIGH (ref 6–20)
CO2: 29 mmol/L (ref 22–32)
CO2: 27 mmol/L (ref 22–32)
Calcium: 8 mg/dL — ABNORMAL LOW (ref 8.9–10.3)
Calcium: 8.3 mg/dL — ABNORMAL LOW (ref 8.9–10.3)
Chloride: 104 mmol/L (ref 98–111)
Chloride: 95 mmol/L — ABNORMAL LOW (ref 98–111)
Creatinine, Ser: 0.45 mg/dL — ABNORMAL LOW (ref 0.61–1.24)
Creatinine, Ser: 0.82 mg/dL (ref 0.61–1.24)
GFR, Estimated: >60 mL/min (ref >60)
GFR, Estimated: >60 mL/min (ref >60)
Glucose, Bld: 156 mg/dL — ABNORMAL HIGH (ref 70–99)
Glucose, Bld: 699 mg/dL (ref 70–99)
Potassium: 3.3 mmol/L — ABNORMAL LOW (ref 3.5–5.1)
Potassium: 5.3 mmol/L — ABNORMAL HIGH (ref 3.5–5.1)
Sodium: 139 mmol/L (ref 135–145)
Sodium: 130 mmol/L — ABNORMAL LOW (ref 135–145)

## 2021-11-09 LAB — CBC
HCT: 33.3 % — ABNORMAL LOW (ref 39.0–52.0)
Hemoglobin: 11.6 g/dL — ABNORMAL LOW (ref 13.0–17.0)
MCH: 33.1 pg (ref 26.0–34.0)
MCHC: 34.8 g/dL (ref 30.0–36.0)
MCV: 95.1 fL (ref 80.0–100.0)
Platelets: 216 10*3/uL (ref 150–400)
RBC: 3.5 MIL/uL — ABNORMAL LOW (ref 4.22–5.81)
RDW: 12 % (ref 11.5–15.5)
WBC: 4.3 10*3/uL (ref 4.0–10.5)
nRBC: 0 % (ref 0.0–0.2)

## 2021-11-09 LAB — CBG MONITORING, ED
Glucose-Capillary: 100 mg/dL — ABNORMAL HIGH (ref 70–99)
Glucose-Capillary: 181 mg/dL — ABNORMAL HIGH (ref 70–99)
Glucose-Capillary: 350 mg/dL — ABNORMAL HIGH (ref 70–99)

## 2021-11-09 LAB — HEMOGLOBIN A1C
Hgb A1c MFr Bld: 9.7 % — ABNORMAL HIGH (ref 4.8–5.6)
Mean Plasma Glucose: 231.69 mg/dL

## 2021-11-09 LAB — GLUCOSE, CAPILLARY
Glucose-Capillary: 76 mg/dL (ref 70–99)
Glucose-Capillary: >600 mg/dL (ref 70–99)

## 2021-11-09 LAB — PROTIME-INR
INR: 0.9 (ref 0.8–1.2)
Prothrombin Time: 12.1 seconds (ref 11.4–15.2)

## 2021-11-09 LAB — LACTIC ACID, PLASMA: Lactic Acid, Venous: 1.9 mmol/L (ref 0.5–1.9)

## 2021-11-09 LAB — APTT: aPTT: 22 seconds — ABNORMAL LOW (ref 24–36)

## 2021-11-09 MED ORDER — LACTATED RINGERS IV BOLUS
500.0000 mL | Freq: Once | INTRAVENOUS | Status: AC
  Administered 2021-11-10: 500 mL via INTRAVENOUS

## 2021-11-09 MED ORDER — INSULIN ASPART 100 UNIT/ML IJ SOLN
20.0000 [IU] | Freq: Once | INTRAMUSCULAR | Status: AC
  Administered 2021-11-09: 20 [IU] via SUBCUTANEOUS
  Filled 2021-11-09: qty 1

## 2021-11-09 MED ORDER — ENSURE MAX PROTEIN PO LIQD
11.0000 [oz_av] | Freq: Two times a day (BID) | ORAL | Status: DC
Start: 2021-11-10 — End: 2021-11-11
  Administered 2021-11-10 – 2021-11-11 (×2): 11 [oz_av] via ORAL
  Filled 2021-11-09: qty 330

## 2021-11-09 MED ORDER — INSULIN ASPART 100 UNIT/ML IJ SOLN
0.0000 [IU] | INTRAMUSCULAR | Status: DC
  Administered 2021-11-10 – 2021-11-11 (×2): 5 [IU] via SUBCUTANEOUS
  Administered 2021-11-11: 2 [IU] via SUBCUTANEOUS
  Filled 2021-11-09 (×3): qty 1

## 2021-11-09 MED ORDER — ADULT MULTIVITAMIN W/MINERALS CH
1.0000 | ORAL_TABLET | Freq: Every day | ORAL | Status: DC
  Administered 2021-11-10 – 2021-11-11 (×2): 1 via ORAL
  Filled 2021-11-09 (×2): qty 1

## 2021-11-09 MED ORDER — LACTATED RINGERS IV SOLN: INTRAVENOUS | Status: DC

## 2021-11-09 MED ORDER — ASCORBIC ACID 500 MG PO TABS
500.0000 mg | ORAL_TABLET | Freq: Two times a day (BID) | ORAL | Status: DC
  Administered 2021-11-10 – 2021-11-11 (×3): 500 mg via ORAL
  Filled 2021-11-09 (×3): qty 1

## 2021-11-09 NOTE — Progress Notes (Signed)
Initial Nutrition Assessment ? ?DOCUMENTATION CODES:  ? ?Not applicable ? ?INTERVENTION:  ? ?Ensure Max protein supplement BID, each supplement provides 150kcal and 30g of protein. ? ?MVI po daily  ? ?Vitamin C 532m po BID ? ?Pt at high refeed risk; recommend monitor potassium, magnesium and phosphorus labs daily until stable ? ?NUTRITION DIAGNOSIS:  ? ?Increased nutrient needs related to wound healing as evidenced by estimated needs. ? ?GOAL:  ? ?Patient will meet greater than or equal to 90% of their needs ? ?MONITOR:  ? ?PO intake, Supplement acceptance, Labs, Weight trends, Skin, I & O's ? ?REASON FOR ASSESSMENT:  ? ?Malnutrition Screening Tool ?  ? ?ASSESSMENT:  ? ?21y/o male with h/o substance abuse, Type I DM, seizures, celiac disease (biopsy diagnosed 08/2013 with TGG IGA >100) who is admitted with toe infection, sepsis and osteomyelitis. ? ?Met with pt in room today. Pt reports good appetite and oral intake pta. Pt reports that he is starving today and wants to eat; pt reports that he has not eaten since 5pm yesterday evening. Pt NPO today pending podiatry consult. Pt reports that the hunger is starting to effect his mental health. Pt has a h/o biopsy diagnosed celiac disease. Pt reports that he does not follow a gluten free diet at home. Pt denies any diarrhea or GI intolerances. RD discussed with pt the importance of adequate nutrition needed to preserve lean muscle and to support wound healing. Pt is willing to drink vanilla or chocolate supplements in hospital. RD will add supplements and vitamins to support wound healing. Per chart, pt is down 20lbs(13%) since December; this is significant weight loss. Pt reports his UBW is 145-150lbs.  ? ?Medications reviewed and include: insulin, cefepime, vancomycin  ? ?Labs reviewed: K 3.3(L), creat 0.45(L) ?Cbgs- 181, 100, 350 x 24 hrs ?AIC 9.7(H)- 4/26 ? ?NUTRITION - FOCUSED PHYSICAL EXAM: ? ?Flowsheet Row Most Recent Value  ?Orbital Region No depletion  ?Upper  Arm Region Mild depletion  ?Thoracic and Lumbar Region No depletion  ?Buccal Region No depletion  ?Temple Region No depletion  ?Clavicle Bone Region Mild depletion  ?Clavicle and Acromion Bone Region Mild depletion  ?Scapular Bone Region No depletion  ?Dorsal Hand Mild depletion  ?Patellar Region Mild depletion  ?Anterior Thigh Region Mild depletion  ?Posterior Calf Region Mild depletion  ?Edema (RD Assessment) None  ?Hair Reviewed  ?Eyes Reviewed  ?Mouth Reviewed  ?Skin Reviewed  ?Nails Reviewed  ? ?Diet Order:   ?Diet Order   ? ?       ?  Diet NPO time specified Except for: Sips with Meds  Diet effective midnight       ?  ? ?  ?  ? ?  ? ?EDUCATION NEEDS:  ? ?Education needs have been addressed ? ?Skin:  Skin Assessment: Reviewed RN Assessment ? ?Last BM:  pta ? ?Height:  ? ?Ht Readings from Last 1 Encounters:  ?11/08/21 '5\' 10"'  (1.778 m)  ? ? ?Weight:  ? ?Wt Readings from Last 1 Encounters:  ?11/08/21 60.1 kg  ? ? ?Ideal Body Weight:  75.45 kg ? ?BMI:  Body mass index is 19.01 kg/m?. ? ?Estimated Nutritional Needs:  ? ?Kcal:  2100-2400kcal/day ? ?Protein:  105-120g/day ? ?Fluid:  1.8-2.1L/day ? ?CKoleen DistanceMS, RD, LDN ?Please refer to AMION for RD and/or RD on-call/weekend/after hours pager ? ?

## 2021-11-09 NOTE — Progress Notes (Addendum)
?Progress Note ? ? ?Patient: Jesus Ewing SVX:[id number] DOB: [birth year] DOA: 11/08/2021     0 ?DOS: the patient was seen and examined on 11/09/2021 ?  ?Brief hospital course: ?Jesus Ewing is a 21 year old male with history of insulin-dependent diabetes mellitus, history of seizures, who presents to the emergency department from home for chief concerns of big toe infection at the advice of his podiatrist. ? ?Initial vitals in the emergency department showed temperature of 98.4, respiration rate of 20, blood pressure, heart rate of 106, SPO2 of 96% on room air. ? ?Serum sodium was 130, potassium 3.9, chloride 96, bicarb of 22, nonfasting blood glucose 377, BUN of 20, serum creatinine of 0.70, GFR greater than 60, lactic acid was 1.1 and increased to 5.4, WBC 6.7, hemoglobin 13.6, platelets of 302. ? ?ED treatment: Morphine 4 mg IV, ondansetron 4 mg IV, cefepime and vancomycin per pharmacy, sodium chloride 1 L bolus. ? ? ? ? ?Assessment and Plan: ? ?Principal Problem: ?  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  Seizure ([facility]) ?  Depression ?  Severe hyperglycemia due to diabetes mellitus ([facility]) ?  Hyponatremia ? ? ?Hyponatremia ?- Corrected serum sodium is 137, [place] 1999 ?-BMP in the a.m. ? ?Osteomyelitis ([facility]) ?- Continue cefepime and vancomycin per pharmacy ?- Blood cultures x2 for collection have been ordered ?- Status post sodium chloride 1 L bolus ?- Patient maintaining appropriate MAP ?- Ordered sodium chloride 125 mL/h, 10 hours ordered ?- Continue cefepime and vancomycin per pharmacy ?- N.p.o. after midnight pending podiatry evaluation ?- Epic order placed to Dr. Allena Katz for inpatient podiatry ? ?Seizure history ([facility]) ?- Resumed home Keppra 500 mg p.o. twice daily ?- Ativan 2 mg IV as needed for seizures and anxiety, 2 doses ordered, with instructions for nursing staff to administer within the parameters and then let provider know ? ? ? ? ? ? ? ? ? ? ? ? ? ? ? ?  ? ?Subjective: Pt  reports he's hungry, no other complaints, no fever, pain is controlled. No CP/SOB, no N/V.  ? ?Physical Exam: ?Vitals:  ? 11/09/21 0600 11/09/21 1321 11/09/21 1606 11/09/21 1607  ?BP: 121/69 (!) 97/56 (!) 95/58 101/61  ?Pulse: 87 80 75 71  ?Resp: 18  16   ?Temp:  97.7 ?F (36.5 ?C) 97.9 ?F (36.6 ?C)   ?TempSrc:  Axillary Oral   ?SpO2: 98% 98% 97%   ?Weight:      ?Height:      ? ?Constitutional:  ?VSS, see nurse notes ?General Appearance: alert, well-developed, well-nourished, NAD ?Neck: ?No masses, trachea midline ?Respiratory: ?Normal respiratory effort ?Breath sounds normal, no wheeze/rhonchi/rales ?Cardiovascular: ?S1/S2 normal, no murmur/rub/gallop auscultated ?No lower extremity edema ?Gastrointestinal: ?Nontender, no masses ?Musculoskeletal:  ?No clubbing/cyanosis of digits ?Neurological: ?No cranial nerve deficit on limited exam ?Psychiatric: ?Normal judgment/insight ?Normal mood and affect ? ?Data Reviewed: ?Results for orders placed or performed during the hospital encounter of 11/08/21 (from the past 24 hour(s))  ?CBG monitoring, ED     Status: Abnormal  ? Collection Time: 11/08/21  9:21 PM  ?Result Value Ref Range  ? Glucose-Capillary 339 (H) 70 - 99 mg/dL  ?CBG monitoring, ED     Status: Abnormal  ? Collection Time: 11/09/21 12:17 AM  ?Result Value Ref Range  ? Glucose-Capillary 350 (H) 70 - 99 mg/dL  ?Protime-INR     Status: None  ? Collection Time: 11/09/21  3:51 AM  ?Result Value Ref Range  ? Prothrombin Time 12.1 11.4 - 15.2 seconds  ?  INR 0.9 0.8 - 1.2  ?APTT     Status: Abnormal  ? Collection Time: 11/09/21  3:51 AM  ?Result Value Ref Range  ? aPTT 22 (L) 24 - 36 seconds  ?Lactic acid, plasma     Status: None  ? Collection Time: 11/09/21  3:51 AM  ?Result Value Ref Range  ? Lactic Acid, Venous 1.9 0.5 - 1.9 mmol/L  ?Hemoglobin A1c     Status: Abnormal  ? Collection Time: 11/09/21  3:51 AM  ?Result Value Ref Range  ? Hgb A1c MFr Bld 9.7 (H) 4.8 - 5.6 %  ? Mean Plasma Glucose 231.69 mg/dL  ?CBC      Status: Abnormal  ? Collection Time: 11/09/21  3:51 AM  ?Result Value Ref Range  ? WBC 4.3 4.0 - 10.5 K/uL  ? RBC 3.50 (L) 4.22 - 5.81 MIL/uL  ? Hemoglobin 11.6 (L) 13.0 - 17.0 g/dL  ? HCT 33.3 (L) 39.0 - 52.0 %  ? MCV 95.1 80.0 - 100.0 fL  ? MCH 33.1 26.0 - 34.0 pg  ? MCHC 34.8 30.0 - 36.0 g/dL  ? RDW 12.0 11.5 - 15.5 %  ? Platelets 216 150 - 400 K/uL  ? nRBC 0.0 0.0 - 0.2 %  ?Basic metabolic panel     Status: Abnormal  ? Collection Time: 11/09/21  3:51 AM  ?Result Value Ref Range  ? Sodium 139 135 - 145 mmol/L  ? Potassium 3.3 (L) 3.5 - 5.1 mmol/L  ? Chloride 104 98 - 111 mmol/L  ? CO2 29 22 - 32 mmol/L  ? Glucose, Bld 156 (H) 70 - 99 mg/dL  ? BUN 12 6 - 20 mg/dL  ? Creatinine, Ser 0.45 (L) 0.61 - 1.24 mg/dL  ? Calcium 8.0 (L) 8.9 - 10.3 mg/dL  ? GFR, Estimated >60 >60 mL/min  ? Anion gap 6 5 - 15  ?CBG monitoring, ED     Status: Abnormal  ? Collection Time: 11/09/21  8:33 AM  ?Result Value Ref Range  ? Glucose-Capillary 100 (H) 70 - 99 mg/dL  ?CBG monitoring, ED     Status: Abnormal  ? Collection Time: 11/09/21 12:18 PM  ?Result Value Ref Range  ? Glucose-Capillary 181 (H) 70 - 99 mg/dL  ? Comment 1 Notify RN   ?Glucose, capillary     Status: None  ? Collection Time: 11/09/21  4:29 PM  ?Result Value Ref Range  ? Glucose-Capillary 76 70 - 99 mg/dL  ? Comment 1 Notify RN   ? ? ? ?Family Communication: mom at bedside  ? ?Disposition: ?Status is: Inpatient ?Remains inpatient appropriate because of need for IV antibiotics and for planned surgical intervention to treat osteomyelitis,  ? Planned Discharge Destination:  TBD pendign surgery and evaluation by PT thereafter  ? ?Time spent: 30 minutes ? ?Author: ?Sunnie Nielsen, DO ?11/09/2021 6:11 PM ? ?For on call review [email or web address].  ?

## 2021-11-09 NOTE — ED Notes (Signed)
Pain meds given

## 2021-11-09 NOTE — Progress Notes (Addendum)
Inpatient Diabetes Program Recommendations ? ?AACE/ADA: New Consensus Statement on Inpatient Glycemic Control  ? ?Target Ranges:  Prepandial:   less than 140 mg/dL ?     Peak postprandial:   less than 180 mg/dL (1-2 hours) ?     Critically ill patients:  140 - 180 mg/dL  ? ? Latest Reference Range & Units 11/09/21 03:51  ?Glucose 70 - 99 mg/dL 761 (H)  ? ? Latest Reference Range & Units 11/08/21 21:21 11/09/21 00:17  ?Glucose-Capillary 70 - 99 mg/dL 607 (H) 371 (H)  ? ?Review of Glycemic Control ? ?Diabetes history: DM1 (does NOT make any insulin; requires basal, correction, and carb coverage insulin) ?Outpatient Diabetes medications: Lantus 50 units QHS, Humalog (1 unit for 8 grams of carbs, 1 unit drops glucose 25 mg/dl) ?Current orders for Inpatient glycemic control: Semglee 50 units QHS, Novolog 0-15 units TID with meals, Novolog 0-5 units QHS ? ?NOTE: Per chart review, noted patient was recently inpatient 10/31/21-11/02/21 with DKA and inpatient diabetes coordinator spoke to patient on 11/01/21. Patient has Type1 DM and is followed by [facility]; last seen Dr. Collene Schlichter on 10/05/21. Per office note on 10/05/21, A1C 9.7% on 10/05/21. Initial glucose 339 mg/dl on 0/62/69. Patient received Semglee 50 units last night and lab glucose 156 mg/dl at 4:85 am today. Will follow along while inpatient. ? ?Thanks, ?Orlando Penner, RN, MSN, CDE ?Diabetes Coordinator ?Inpatient Diabetes Program ?[phone] (Team Pager from 8am to 5pm) ? ? ? ?

## 2021-11-09 NOTE — Progress Notes (Signed)
Admission profile updated. ?

## 2021-11-09 NOTE — ED Notes (Signed)
Fsbs 350 ?

## 2021-11-09 NOTE — ED Notes (Signed)
Urinal @ the bedside. ?

## 2021-11-10 ENCOUNTER — Other Ambulatory Visit: Payer: Self-pay

## 2021-11-10 ENCOUNTER — Inpatient Hospital Stay: Payer: 59 | Admitting: Anesthesiology

## 2021-11-10 ENCOUNTER — Encounter: Payer: Self-pay | Admitting: Osteopathic Medicine

## 2021-11-10 ENCOUNTER — Encounter
Admission: EM | Disposition: A | Discharge status: Home / Self Care | Payer: Self-pay | Source: Home / Self Care | Attending: Osteopathic Medicine

## 2021-11-10 DIAGNOSIS — M86671 Other chronic osteomyelitis, right ankle and foot: Secondary | ICD-10-CM

## 2021-11-10 DIAGNOSIS — E1169 Type 2 diabetes mellitus with other specified complication: Secondary | ICD-10-CM

## 2021-11-10 HISTORY — PX: AMPUTATION TOE: SHX6595

## 2021-11-10 LAB — BASIC METABOLIC PANEL
Anion gap: 5 (ref 5–15)
BUN: 21 mg/dL — ABNORMAL HIGH (ref 6–20)
CO2: 29 mmol/L (ref 22–32)
Calcium: 8.3 mg/dL — ABNORMAL LOW (ref 8.9–10.3)
Chloride: 103 mmol/L (ref 98–111)
Creatinine, Ser: 0.42 mg/dL — ABNORMAL LOW (ref 0.61–1.24)
GFR, Estimated: >60 mL/min (ref >60)
Glucose, Bld: 146 mg/dL — ABNORMAL HIGH (ref 70–99)
Potassium: 3.4 mmol/L — ABNORMAL LOW (ref 3.5–5.1)
Sodium: 137 mmol/L (ref 135–145)

## 2021-11-10 LAB — CBC
HCT: 33.8 % — ABNORMAL LOW (ref 39.0–52.0)
Hemoglobin: 12.1 g/dL — ABNORMAL LOW (ref 13.0–17.0)
MCH: 33.9 pg (ref 26.0–34.0)
MCHC: 35.8 g/dL (ref 30.0–36.0)
MCV: 94.7 fL (ref 80.0–100.0)
Platelets: 225 10*3/uL (ref 150–400)
RBC: 3.57 MIL/uL — ABNORMAL LOW (ref 4.22–5.81)
RDW: 11.7 % (ref 11.5–15.5)
WBC: 4 10*3/uL (ref 4.0–10.5)
nRBC: 0 % (ref 0.0–0.2)

## 2021-11-10 LAB — MAGNESIUM: Magnesium: 2.2 mg/dL (ref 1.7–2.4)

## 2021-11-10 LAB — GLUCOSE, CAPILLARY
Glucose-Capillary: 124 mg/dL — ABNORMAL HIGH (ref 70–99)
Glucose-Capillary: 159 mg/dL — ABNORMAL HIGH (ref 70–99)
Glucose-Capillary: 239 mg/dL — ABNORMAL HIGH (ref 70–99)
Glucose-Capillary: 482 mg/dL — ABNORMAL HIGH (ref 70–99)
Glucose-Capillary: 495 mg/dL — ABNORMAL HIGH (ref 70–99)
Glucose-Capillary: 79 mg/dL (ref 70–99)
Glucose-Capillary: 91 mg/dL (ref 70–99)
Glucose-Capillary: 98 mg/dL (ref 70–99)
Glucose-Capillary: >600 mg/dL (ref 70–99)

## 2021-11-10 LAB — PHOSPHORUS: Phosphorus: 5 mg/dL — ABNORMAL HIGH (ref 2.5–4.6)

## 2021-11-10 SURGERY — AMPUTATION, TOE
Anesthesia: General | Site: Toe | Laterality: Right

## 2021-11-10 MED ORDER — FENTANYL CITRATE (PF) 100 MCG/2ML IJ SOLN
INTRAMUSCULAR | Status: AC
Start: 2021-11-10 — End: ?
  Filled 2021-11-10: qty 2

## 2021-11-10 MED ORDER — DEXTROSE 50 % IV SOLN: 0.5000 | Freq: Once | INTRAVENOUS | Status: AC

## 2021-11-10 MED ORDER — DEXMEDETOMIDINE (PRECEDEX) IN NS 20 MCG/5ML (4 MCG/ML) IV SYRINGE
PREFILLED_SYRINGE | INTRAVENOUS | Status: DC | PRN
  Administered 2021-11-10: 4 ug via INTRAVENOUS
  Administered 2021-11-10: 8 ug via INTRAVENOUS

## 2021-11-10 MED ORDER — OXYCODONE HCL 5 MG/5ML PO SOLN: 5.0000 mg | Freq: Once | ORAL | Status: DC | PRN

## 2021-11-10 MED ORDER — SODIUM CHLORIDE 0.9 % IV SOLN: INTRAVENOUS | Status: DC | PRN

## 2021-11-10 MED ORDER — MIDAZOLAM HCL 2 MG/2ML IJ SOLN
INTRAMUSCULAR | Status: DC | PRN
  Administered 2021-11-10: 2 mg via INTRAVENOUS

## 2021-11-10 MED ORDER — OXYCODONE HCL 5 MG PO TABS: 5.0000 mg | ORAL_TABLET | Freq: Once | ORAL | Status: DC | PRN

## 2021-11-10 MED ORDER — FENTANYL CITRATE (PF) 100 MCG/2ML IJ SOLN: 25.0000 ug | INTRAMUSCULAR | Status: DC | PRN

## 2021-11-10 MED ORDER — BUPIVACAINE HCL (PF) 0.5 % IJ SOLN
INTRAMUSCULAR | Status: AC
  Filled 2021-11-10: qty 30

## 2021-11-10 MED ORDER — PROPOFOL 500 MG/50ML IV EMUL
INTRAVENOUS | Status: DC | PRN
  Administered 2021-11-10: 150 ug/kg/min via INTRAVENOUS

## 2021-11-10 MED ORDER — PROPOFOL 500 MG/50ML IV EMUL
INTRAVENOUS | Status: AC
  Filled 2021-11-10: qty 50

## 2021-11-10 MED ORDER — DEXTROSE 50 % IV SOLN
INTRAVENOUS | Status: AC
  Administered 2021-11-10: 25 mL via INTRAVENOUS
  Filled 2021-11-10: qty 50

## 2021-11-10 MED ORDER — DEXTROSE IN LACTATED RINGERS 5 % IV SOLN: INTRAVENOUS | Status: DC

## 2021-11-10 MED ORDER — BUPIVACAINE HCL 0.5 % IJ SOLN
INTRAMUSCULAR | Status: DC | PRN
  Administered 2021-11-10: 10 mL

## 2021-11-10 MED ORDER — MIDAZOLAM HCL 2 MG/2ML IJ SOLN
INTRAMUSCULAR | Status: AC
  Filled 2021-11-10: qty 2

## 2021-11-10 MED ORDER — PROPOFOL 10 MG/ML IV BOLUS
INTRAVENOUS | Status: DC | PRN
Start: 2021-11-10 — End: 2021-11-10
  Administered 2021-11-10: 50 mg via INTRAVENOUS
  Administered 2021-11-10: 30 mg via INTRAVENOUS

## 2021-11-10 MED ORDER — INSULIN ASPART 100 UNIT/ML IJ SOLN
25.0000 [IU] | Freq: Once | INTRAMUSCULAR | Status: AC
  Administered 2021-11-10: 25 [IU] via SUBCUTANEOUS
  Filled 2021-11-10: qty 1

## 2021-11-10 SURGICAL SUPPLY — 49 items
BLADE MED AGGRESSIVE (BLADE) ×2 IMPLANT
BLADE OSC/SAGITTAL MD 5.5X18 (BLADE) IMPLANT
BLADE SURG 15 STRL LF DISP TIS (BLADE) IMPLANT
BLADE SURG 15 STRL SS (BLADE)
BLADE SURG MINI STRL (BLADE) IMPLANT
BNDG CONFORM 2 STRL LF (GAUZE/BANDAGES/DRESSINGS) IMPLANT
BNDG ELASTIC 3X5.8 VLCR STR LF (GAUZE/BANDAGES/DRESSINGS) ×1 IMPLANT
BNDG ELASTIC 4X5.8 VLCR STR LF (GAUZE/BANDAGES/DRESSINGS) ×2 IMPLANT
BNDG ESMARK 4X12 TAN STRL LF (GAUZE/BANDAGES/DRESSINGS) ×2 IMPLANT
BNDG GAUZE ELAST 4 BULKY (GAUZE/BANDAGES/DRESSINGS) ×2 IMPLANT
CNTNR SPEC 2.5X3XGRAD LEK (MISCELLANEOUS) ×1
CONT SPEC 4OZ STER OR WHT (MISCELLANEOUS) ×1
CONTAINER SPEC 2.5X3XGRAD LEK (MISCELLANEOUS) ×1 IMPLANT
CUFF TOURN SGL QUICK 12 (TOURNIQUET CUFF) IMPLANT
CUFF TOURN SGL QUICK 18X4 (TOURNIQUET CUFF) IMPLANT
DRAPE FLUOR MINI C-ARM 54X84 (DRAPES) IMPLANT
DURAPREP 26ML APPLICATOR (WOUND CARE) ×2 IMPLANT
ELECT REM PT RETURN 9FT ADLT (ELECTROSURGICAL) ×2
ELECTRODE REM PT RTRN 9FT ADLT (ELECTROSURGICAL) ×1 IMPLANT
GAUZE SPONGE 4X4 12PLY STRL (GAUZE/BANDAGES/DRESSINGS) ×3 IMPLANT
GAUZE XEROFORM 1X8 LF (GAUZE/BANDAGES/DRESSINGS) ×2 IMPLANT
GLOVE BIOGEL PI IND STRL 8 (GLOVE) ×1 IMPLANT
GLOVE BIOGEL PI INDICATOR 8 (GLOVE) ×1
GLOVE SURG ENC MOIS LTX SZ8 (GLOVE) ×2 IMPLANT
GOWN STRL REUS W/ TWL LRG LVL3 (GOWN DISPOSABLE) ×2 IMPLANT
GOWN STRL REUS W/TWL LRG LVL3 (GOWN DISPOSABLE) ×2
HANDPIECE VERSAJET DEBRIDEMENT (MISCELLANEOUS) IMPLANT
IV NS IRRIG 3000ML ARTHROMATIC (IV SOLUTION) IMPLANT
KIT TURNOVER KIT A (KITS) ×2 IMPLANT
LABEL OR SOLS (LABEL) ×2 IMPLANT
MANIFOLD NEPTUNE II (INSTRUMENTS) ×2 IMPLANT
NDL FILTER BLUNT 18X1 1/2 (NEEDLE) ×1 IMPLANT
NDL HYPO 25X1 1.5 SAFETY (NEEDLE) ×2 IMPLANT
NEEDLE FILTER BLUNT 18X 1/2SAF (NEEDLE) ×1
NEEDLE FILTER BLUNT 18X1 1/2 (NEEDLE) ×1 IMPLANT
NEEDLE HYPO 25X1 1.5 SAFETY (NEEDLE) ×4 IMPLANT
NS IRRIG 500ML POUR BTL (IV SOLUTION) ×2 IMPLANT
PACK EXTREMITY ARMC (MISCELLANEOUS) ×2 IMPLANT
PAD ABD DERMACEA PRESS 5X9 (GAUZE/BANDAGES/DRESSINGS) ×1 IMPLANT
SOL PREP PVP 2OZ (MISCELLANEOUS) ×2
SOLUTION PREP PVP 2OZ (MISCELLANEOUS) ×1 IMPLANT
STOCKINETTE STRL 6IN 960660 (GAUZE/BANDAGES/DRESSINGS) ×2 IMPLANT
SUT ETHILON 3-0 FS-10 30 BLK (SUTURE) ×4
SUT VIC AB 3-0 SH 27 (SUTURE) ×1
SUT VIC AB 3-0 SH 27X BRD (SUTURE) ×1 IMPLANT
SUTURE EHLN 3-0 FS-10 30 BLK (SUTURE) ×2 IMPLANT
SWAB CULTURE AMIES ANAERIB BLU (MISCELLANEOUS) IMPLANT
SYR 10ML LL (SYRINGE) ×2 IMPLANT
WATER STERILE IRR 500ML POUR (IV SOLUTION) ×2 IMPLANT

## 2021-11-10 NOTE — Interval H&P Note (Signed)
History and Physical Interval Note: ? ?11/10/2021 ?11:56 AM ? ?Jesus Ewing  has presented today for surgery, with the diagnosis of Infection right great toe.  The various methods of treatment have been discussed with the patient and family. After consideration of risks, benefits and other options for treatment, the patient has consented to  Procedure(s): ?AMPUTATION TOE (Right) as a surgical intervention.  The patient's history has been reviewed, patient examined, no change in status, stable for surgery.  I have reviewed the patient's chart and labs.  Questions were answered to the patient's satisfaction.   ? ? ?Candelaria Stagers ? ? ?

## 2021-11-10 NOTE — Progress Notes (Addendum)
Inpatient Diabetes Program Recommendations ? ?AACE/ADA: New Consensus Statement on Inpatient Glycemic Control (2015) ? ?Target Ranges:  Prepandial:   less than 140 mg/dL ?     Peak postprandial:   less than 180 mg/dL (1-2 hours) ?     Critically ill patients:  140 - 180 mg/dL  ? ?Lab Results  ?Component Value Date  ? GLUCAP 98 11/10/2021  ? HGBA1C 9.7 (H) 11/09/2021  ? ? ?Review of Glycemic Control ? Latest Reference Range & Units 11/09/21 16:29 11/09/21 21:53 11/10/21 00:12 11/10/21 04:05 11/10/21 07:54 11/10/21 10:54  ?Glucose-Capillary 70 - 99 mg/dL 76 >703 (HH) >500 (HH) 159 (H) 98 79  ?[facility]): Data is critically high ?(H): Data is abnormally high ? ?Diabetes history: DM1 (does NOT make any insulin; requires basal, correction, and carb coverage insulin) ?Outpatient Diabetes medications: Lantus 50 units QHS, Humalog (1 unit for 8 grams of carbs, 1 unit drops glucose 25 mg/dl) ?Current orders for Inpatient glycemic control: Semglee 50 units QHS, Novolog 0-15 units Q4H (both on hold for surgery) ? ?Inpatient Diabetes Program Recommendations:   ? ?May need to add more dextrose to IVF given CBGs 98/79  ? ?Would also recommend decreasing Semglee to 80% of home dose 40 units QHS and small dose of meals coverage once eating.   ? ?Per chart review, insulins on hold for surgery, D5LR @ 125 ordered as he received Semglee 50 units last evening.  CBGs elevated last evening due to large meal prior to NPO status.  Novolog 20 units was administered.  Fasting CBG was 98 mg/dL. ? ?Addendum@14 :22: ?Spoke with patient about DM management.  He states he is in the process of getting the Dexcom CGM via [employer].  He admits to taking his Lantus 50 units 5 days out of 7 on average due to depression.  He lives alone.  Encouraged him to try to administer his insulin everyday to help with healing and decrease risk of long and short term complications.  He denies difficulty obtaining insulin.  Denies hypoglycemia and is aware of signs,  symptoms and treatments.   ? ?Will continue to follow while inpatient. ? ?Thank you, ?Dulce Sellar, MSN, RN ?Diabetes Coordinator ?Inpatient Diabetes Program ?[phone] (team pager from 8a-5p) ?   ? ? ?

## 2021-11-10 NOTE — Progress Notes (Signed)
Mobility Specialist - Progress Note ? ? 11/10/21 1200  ?Mobility  ?Activity Off unit  ? ? ? ?Pt off unit for procedure upon arrival. Will re-attempt at another date and time. ? ?Merrily Brittle ?Mobility Specialist ?11/10/21, 12:58 PM ? ? ? ? ?

## 2021-11-10 NOTE — Anesthesia Preprocedure Evaluation (Signed)
Anesthesia Evaluation  ?Patient identified by MRN, date of birth, ID band ?Patient awake ? ? ? ?Reviewed: ?Allergy & Precautions, NPO status , Patient's Chart, lab work & pertinent test results ? ?History of Anesthesia Complications ?Negative for: history of anesthetic complications ? ?Airway ?Mallampati: III ? ?TM Distance: >3 FB ?Neck ROM: full ? ? ? Dental ? ?(+) Chipped, Poor Dentition ?  ?Pulmonary ?neg shortness of breath,  ?  ?Pulmonary exam normal ? ? ? ? ? ? ? Cardiovascular ?Exercise Tolerance: Good ?(-) angina(-) Past MI + dysrhythmias Supra Ventricular Tachycardia  ? ? ?  ?Neuro/Psych ?Seizures -, Poorly Controlled,  PSYCHIATRIC DISORDERS   ? GI/Hepatic ?negative GI ROS, Neg liver ROS, neg GERD  ,  ?Endo/Other  ?diabetes, Type 2, Insulin Dependent ? Renal/GU ?CRFRenal disease  ?negative genitourinary ?  ?Musculoskeletal ? ? Abdominal ?  ?Peds ? Hematology ?negative hematology ROS ?(+)   ?Anesthesia Other Findings ?Past Medical History: ?No date: Celiac disease ?No date: Diabetes mellitus without complication (HCC) ? ?Past Surgical History: ?No date: NO PAST SURGERIES ? ?BMI   ? Body Mass Index: 19.01 kg/m?  ?  ? ? Reproductive/Obstetrics ?negative OB ROS ? ?  ? ? ? ? ? ? ? ? ? ? ? ? ? ?  ?  ? ? ? ? ? ? ? ? ?Anesthesia Physical ?Anesthesia Plan ? ?ASA: 3 ? ?Anesthesia Plan: General  ? ?Post-op Pain Management:   ? ?Induction: Intravenous ? ?PONV Risk Score and Plan: Propofol infusion and TIVA ? ?Airway Management Planned: Natural Airway and Nasal Cannula ? ?Additional Equipment:  ? ?Intra-op Plan:  ? ?Post-operative Plan:  ? ?Informed Consent: I have reviewed the patients History and Physical, chart, labs and discussed the procedure including the risks, benefits and alternatives for the proposed anesthesia with the patient or authorized representative who has indicated his/her understanding and acceptance.  ? ? ? ?Dental Advisory Given ? ?Plan Discussed with:  Anesthesiologist, CRNA and Surgeon ? ?Anesthesia Plan Comments: (Patient consented for risks of anesthesia including but not limited to:  ?- adverse reactions to medications ?- risk of airway placement if required ?- damage to eyes, teeth, lips or other oral mucosa ?- nerve damage due to positioning  ?- sore throat or hoarseness ?- Damage to heart, brain, nerves, lungs, other parts of body or loss of life ? ?Patient voiced understanding.)  ? ? ? ? ? ? ?Anesthesia Quick Evaluation ? ?

## 2021-11-10 NOTE — Anesthesia Procedure Notes (Signed)
Procedure Name: [place] ?Date/Time: 11/10/2021 12:12 PM ?Performed by: Tollie Eth, CRNA ?Pre-anesthesia Checklist: Patient identified, Emergency Drugs available, Suction available and Patient being monitored ?Patient Re-evaluated:Patient Re-evaluated prior to induction ?Oxygen Delivery Method: Nasal cannula ?Induction Type: IV induction ?Placement Confirmation: positive ETCO2 ? ? ? ? ?

## 2021-11-10 NOTE — TOC Initial Note (Signed)
Transition of Care (TOC) - Initial/Assessment Note  ? ? ?Patient Details  ?Name: Jesus Ewing ?MRN: [id number] ?Date of Birth: [birth year] ? ?Transition of Care (TOC) CM/SW Contact:    ?Truddie Hidden, RN ?Phone Number: ?11/10/2021, 12:46 PM ? ?Clinical Narrative:                 ? ?Transition of Care (TOC) Screening Note ? ? ?Patient Details  ?Name: Jesus Ewing ?Date of Birth: Jul 25, 2000 ? ? ?Transition of Care (TOC) CM/SW Contact:    ?Truddie Hidden, RN ?Phone Number: ?11/10/2021, 12:46 PM ? ? ? ?Transition of Care Department [facility]) has reviewed patient and no TOC needs have been identified at this time. We will continue to monitor patient advancement through interdisciplinary progression rounds. If new patient transition needs arise, please place a TOC consult. ? ? ? ?  ?  ? ? ?Patient Goals and CMS Choice ?  ?  ?  ? ?Expected Discharge Plan and Services ?  ?  ?  ?  ?  ?                ?  ?  ?  ?  ?  ?  ?  ?  ?  ?  ? ?Prior Living Arrangements/Services ?  ?  ?  ?       ?  ?  ?  ?  ? ?Activities of Daily Living ?Home Assistive Devices/Equipment: None ?ADL Screening (condition at time of admission) ?Patient's cognitive ability adequate to safely complete daily activities?: Yes ?Is the patient deaf or have difficulty hearing?: No ?Does the patient have difficulty seeing, even when wearing glasses/contacts?: No ?Does the patient have difficulty concentrating, remembering, or making decisions?: No ?Patient able to express need for assistance with ADLs?: Yes ?Does the patient have difficulty dressing or bathing?: No ?Independently performs ADLs?: Yes (appropriate for developmental age) ?Does the patient have difficulty walking or climbing stairs?: No ?Weakness of Legs: None ?Weakness of Arms/Hands: None ? ?Permission Sought/Granted ?  ?  ?   ?   ?   ?   ? ?Emotional Assessment ?  ?  ?  ?  ?  ?  ? ?Admission diagnosis:  Osteomyelitis (HCC) [M86.9] ?Sepsis (HCC) [A41.9] ?Osteomyelitis of right foot,  unspecified type (HCC) [M86.9] ?Patient Active Problem List  ? Diagnosis Date Noted  ? Sepsis (HCC) 11/08/2021  ? Osteomyelitis (HCC) 11/08/2021  ? Hyponatremia 11/08/2021  ? Hyperkalemia 10/31/2021  ? Severe hyperglycemia due to diabetes mellitus (HCC) 07/03/2021  ? DKA (diabetic ketoacidosis) (HCC) 02/14/2021  ? Seizure (HCC) 02/14/2021  ? AKI (acute kidney injury) (HCC) 02/14/2021  ? Overdose of trazodone 02/14/2021  ? Depression 02/14/2021  ? Celiac disease 06/19/2016  ? ?PCP:  System, Provider Not In ?Pharmacy:   ?[id number] [place], [place] - [address] ?[address] ?[place] [address] ?Phone: [phone] Fax: [phone] ? ?[id number] - [place], [place] - [address] ?[address] ?[place] [address] ?Phone: [phone] Fax: [phone] ? ? ? ? ?Social Determinants of Health (SDOH) Interventions ?  ? ?Readmission Risk Interventions ?   ? View : No data to display.  ?  ?  ?  ? ? ? ?

## 2021-11-10 NOTE — Progress Notes (Signed)
?Progress Note ? ? ?Patient: Jesus Ewing VPX:[id number] DOB: 02/25/2001 DOA: 11/08/2021     1 ?DOS: the patient was seen and examined on 11/10/2021 ?  ?Brief hospital course: ?[name] is a 21 year old male with history of insulin-dependent diabetes mellitus, history of seizures, who presents to the emergency department from home for chief concerns of big toe infection at the advice of his podiatrist. ? ?Initial vitals in the emergency department showed temperature of 98.4, respiration rate of 20, blood pressure, heart rate of 106, SPO2 of 96% on room air. ? ?Serum sodium was 130, potassium 3.9, chloride 96, bicarb of 22, nonfasting blood glucose 377, BUN of 20, serum creatinine of 0.70, GFR greater than 60, lactic acid was 1.1 and increased to 5.4, WBC 6.7, hemoglobin 13.6, platelets of 302. ? ?ED treatment: Morphine 4 mg IV, ondansetron 4 mg IV, cefepime and vancomycin per pharmacy, sodium chloride 1 L bolus. ? ? ? ? ?Assessment and Plan: ? ?Principal Problem: ?  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  Seizure (HCC) ?  Depression ?  Severe hyperglycemia due to diabetes mellitus (HCC) ?  Hyponatremia ? ?Osteomyelitis (HCC) ?- Continue cefepime and vancomycin per pharmacy ?- Blood cultures x2 for collection have been ordered ?- Continue cefepime and vancomycin per pharmacy ?- Amputation today 11/10/21 R 1st toe, per podiatry ok to go home tomorrow, no dressing change until follow-up in 1 week ? ?Hyponatremia ?- Corrected serum sodium is 137, [place] 1999 ?-BMP in the a.m. ? ?Seizure history (HCC) ?- Resumed home Keppra 500 mg p.o. twice daily ?- Ativan 2 mg IV as needed for seizures and anxiety, 2 doses ordered, with instructions for nursing staff to administer within the parameters and then let provider know ? ? ? ? ? ? ? ? ? ? ? ? ? ? ? ?  ? ?Subjective: Pt seen post-op, tolerating diet, pain controlled.  ? ?Physical Exam: ?Vitals:  ? 11/10/21 1320 11/10/21 1325 11/10/21 1330 11/10/21  1520  ?BP: 100/67  (!) 101/58 106/63  ?Pulse: 64 65 64 81  ?Resp: 15 17 16 18   ?Temp:    98.5 ?F (36.9 ?C)  ?TempSrc:      ?SpO2: 96% 96% 95% 99%  ?Weight:      ?Height:      ? ?Constitutional:  ?VSS, see nurse notes ?General Appearance: alert, well-developed, well-nourished, NAD ?Neck: ?No masses, trachea midline ?Respiratory: ?Normal respiratory effort ?Breath sounds normal, no wheeze/rhonchi/rales ?Cardiovascular: ?S1/S2 normal, no murmur/rub/gallop auscultated ?No lower extremity edema ?Gastrointestinal: ?Nontender, no masses ?Musculoskeletal:  ?No clubbing/cyanosis of digits ?Bandage/ACE wrap and walking shoe in place on L foot  ?Neurological: ?No cranial nerve deficit on limited exam ?Psychiatric: ?Normal judgment/insight ?Normal mood and affect ? ?Data Reviewed: ?Results for orders placed or performed during the hospital encounter of 11/08/21 (from the past 24 hour(s))  ?Glucose, capillary     Status: Abnormal  ? Collection Time: 11/09/21  9:53 PM  ?Result Value Ref Range  ? Glucose-Capillary >600 (HH) 70 - 99 mg/dL  ?Basic metabolic panel     Status: Abnormal  ? Collection Time: 11/09/21 10:09 PM  ?Result Value Ref Range  ? Sodium 130 (L) 135 - 145 mmol/L  ? Potassium 5.3 (H) 3.5 - 5.1 mmol/L  ? Chloride 95 (L) 98 - 111 mmol/L  ? CO2 27 22 - 32 mmol/L  ? Glucose, Bld 699 (HH) 70 - 99 mg/dL  ? BUN 26 (H) 6 - 20 mg/dL  ? Creatinine, Ser 0.82 0.61 -  1.24 mg/dL  ? Calcium 8.3 (L) 8.9 - 10.3 mg/dL  ? GFR, Estimated >60 >60 mL/min  ? Anion gap 8 5 - 15  ?Glucose, capillary     Status: Abnormal  ? Collection Time: 11/10/21 12:12 AM  ?Result Value Ref Range  ? Glucose-Capillary >600 (HH) 70 - 99 mg/dL  ?Glucose, capillary     Status: Abnormal  ? Collection Time: 11/10/21  4:05 AM  ?Result Value Ref Range  ? Glucose-Capillary 159 (H) 70 - 99 mg/dL  ?Basic metabolic panel     Status: Abnormal  ? Collection Time: 11/10/21  5:46 AM  ?Result Value Ref Range  ? Sodium 137 135 - 145 mmol/L  ? Potassium 3.4 (L) 3.5 - 5.1  mmol/L  ? Chloride 103 98 - 111 mmol/L  ? CO2 29 22 - 32 mmol/L  ? Glucose, Bld 146 (H) 70 - 99 mg/dL  ? BUN 21 (H) 6 - 20 mg/dL  ? Creatinine, Ser 0.42 (L) 0.61 - 1.24 mg/dL  ? Calcium 8.3 (L) 8.9 - 10.3 mg/dL  ? GFR, Estimated >60 >60 mL/min  ? Anion gap 5 5 - 15  ?Magnesium     Status: None  ? Collection Time: 11/10/21  5:46 AM  ?Result Value Ref Range  ? Magnesium 2.2 1.7 - 2.4 mg/dL  ?Phosphorus     Status: Abnormal  ? Collection Time: 11/10/21  5:46 AM  ?Result Value Ref Range  ? Phosphorus 5.0 (H) 2.5 - 4.6 mg/dL  ?CBC     Status: Abnormal  ? Collection Time: 11/10/21  5:46 AM  ?Result Value Ref Range  ? WBC 4.0 4.0 - 10.5 K/uL  ? RBC 3.57 (L) 4.22 - 5.81 MIL/uL  ? Hemoglobin 12.1 (L) 13.0 - 17.0 g/dL  ? HCT 33.8 (L) 39.0 - 52.0 %  ? MCV 94.7 80.0 - 100.0 fL  ? MCH 33.9 26.0 - 34.0 pg  ? MCHC 35.8 30.0 - 36.0 g/dL  ? RDW 11.7 11.5 - 15.5 %  ? Platelets 225 150 - 400 K/uL  ? nRBC 0.0 0.0 - 0.2 %  ?Glucose, capillary     Status: None  ? Collection Time: 11/10/21  7:54 AM  ?Result Value Ref Range  ? Glucose-Capillary 98 70 - 99 mg/dL  ?Glucose, capillary     Status: None  ? Collection Time: 11/10/21 10:54 AM  ?Result Value Ref Range  ? Glucose-Capillary 79 70 - 99 mg/dL  ?Glucose, capillary     Status: Abnormal  ? Collection Time: 11/10/21 11:44 AM  ?Result Value Ref Range  ? Glucose-Capillary 124 (H) 70 - 99 mg/dL  ?Glucose, capillary     Status: None  ? Collection Time: 11/10/21 12:47 PM  ?Result Value Ref Range  ? Glucose-Capillary 91 70 - 99 mg/dL  ? Comment 1 Notify RN   ? Comment 2 Document in Chart   ?Glucose, capillary     Status: Abnormal  ? Collection Time: 11/10/21  3:24 PM  ?Result Value Ref Range  ? Glucose-Capillary 239 (H) 70 - 99 mg/dL  ? Comment 1 Notify RN   ? Comment 2 Document in Chart   ? ? ? ?Family Communication: mom at bedside  ? ?Disposition: ?Status is: Inpatient ?Remains inpatient appropriate because of need for IV antibiotics and for planned surgical intervention to treat  osteomyelitis,  ? Planned Discharge Destination: home likely tomorrow  ? ? ?Author: ?Sunnie Nielsen, DO ?11/10/2021 4:31 PM ? ?For on call review [email or web address].  ?

## 2021-11-10 NOTE — Transfer of Care (Signed)
Immediate Anesthesia Transfer of Care Note ? ?Patient: Jesus Ewing ? ?Procedure(s) Performed: AMPUTATION TOE (Right: Toe) ? ?Patient Location: PACU ? ?Anesthesia Type:General ? ?Level of Consciousness: drowsy ? ?Airway & Oxygen Therapy: Patient Spontanous Breathing ? ?Post-op Assessment: Report given to RN and Post -op Vital signs reviewed and stable ? ?Post vital signs: Reviewed and stable ? ?Last Vitals:  ?Vitals Value Taken Time  ?BP 84/54 11/10/21 1242  ?Temp    ?Pulse 66 11/10/21 1246  ?Resp 19 11/10/21 1246  ?SpO2 94 % 11/10/21 1246  ?Vitals shown include unvalidated device data. ? ?Last Pain:  ?Vitals:  ? 11/10/21 1052  ?TempSrc:   ?PainSc: 2   ?   ? ?Patients Stated Pain Goal: 0 (11/10/21 1052) ? ?Complications: No notable events documented. ?

## 2021-11-10 NOTE — Op Note (Signed)
Surgeon: Surgeon(s): ?Candelaria Stagers, DPM  ?Assistants: None ?Pre-operative diagnosis: Infection right great toe  ?Post-operative diagnosis: same ?Procedure: Procedure(s) (LRB): ?AMPUTATION TOE (Right)  ?Pathology:  ?ID Type Source Tests Collected by Time Destination  ?1 : Right great toe Tissue PATH Other SURGICAL PATHOLOGY Candelaria Stagers, DPM 11/10/2021 1230   ?  ?Pertinent Intra-op findings: Osteomyelitic changes noted to the distal phalanx the rest of the bone hard indurated ?Anesthesia: Monitor Anesthesia Care  ?Hemostasis:  ?Total Tourniquet Time Documented: ?Calf (Right) - 15 minutes ?Total: Calf (Right) - 15 minutes ? ?EBL: Minimal ?Materials: 3-0 Prolene ?Injectables: 10 cc of half percent Marcaine plain ?Complications: None ? ?Indications for surgery: ?A 21 y.o. male presents with right hallux distal phalanx osteomyelitis. Patient has failed all conservative therapy including but not limited to local wound care and IV antibiotics. He wishes to have surgical correction of the foot/deformity. It was determined that patient would benefit from right partial hallux amputation. ?Informed surgical risk consent was reviewed and read aloud to the patient.  I reviewed the films.  I have discussed my findings with the patient in great detail.  I have discussed all risks including but not limited to infection, stiffness, scarring, limp, disability, deformity, damage to blood vessels and nerves, numbness, poor healing, need for braces, arthritis, chronic pain, amputation, death.  All benefits and realistic expectations discussed in great detail.  I have made no promises as to the outcome.  I have provided realistic expectations.  I have offered the patient a 2nd opinion, which they have declined and assured me they preferred to proceed despite the risks ? ? ?Procedure in detail: ?The patient was both verbally and visually identified by myself, the nursing staff, and anesthesia staff in the preoperative holding area.  They were then transferred to the operating room and placed on the operative table in supine position. ? ?Attention was directed to the right hallux of fishmouth style incision was delineated using skin marker.  Using #15 blade the incision was carried down from epidermal dermal junction down to the layer of the bone.  The the digit was disarticulated at the IPJ joint of the hallux.  At this time for closure purposes patient will benefit from head of the proximal phalanx resection.  The head of the proximal phalanx were resected with standard technique using sagittal saw.  At this time no signs of infection noted.  The wound was thoroughly irrigated with normal saline solution.  The wound was primarily closed with 3-0 Prolene.  It was dressed with Xeroform Kerlix Ace bandage 4 x 4 gauze.  All bony prominences were adequately padded. ? ?Disposition  ?Patient is okay to be discharged from my standpoint.  Patient can be discharged on antibiotics Bactrim for 14 days.  Patient clear margins were hard indurated no further signs of infection noted.  The wound is primarily closed.  No dressing change until follow-up.  Weightbearing as tolerated in surgical shoe. ? ?At the conclusion of the procedure the patient was awoken from anesthesia and found to have tolerated the procedure well any complications. There were transferred to PACU with vital signs stable and vascular status intact. ? ?Nicholes [place], DPM ? ?

## 2021-11-10 NOTE — Consult Note (Signed)
? ?Reason for Consult: Right great toe infection ?Referring Physician: Dr. Lyn Hollingshead ? ?Jesus Ewing is an 21 y.o. male.  ?HPI: He is known to my partner Dr. Allena Katz he was seen in the outpatient office yesterday for follow-up on an infection of the nail plate which had previously been removed secondary to infection.  He has uncontrolled type 1 diabetes.  MRI was completed last night.  He is here at bedside with his mother ? ?Past Medical History:  ?Diagnosis Date  ? Celiac disease   ? Diabetes mellitus without complication (HCC)   ? ? ?Past Surgical History:  ?Procedure Laterality Date  ? NO PAST SURGERIES    ? ? ?History reviewed. No pertinent family history. ? ?Social History:  reports that he has never smoked. He has never used smokeless tobacco. He reports current alcohol use. He reports current drug use. Drug: Cocaine. ? ?Allergies: No Known Allergies ? ?Medications: I have reviewed the patient's current medications. ? ?Results for orders placed or performed during the hospital encounter of 11/08/21 (from the past 48 hour(s))  ?Lactic acid, plasma     Status: None  ? Collection Time: 11/08/21  3:56 PM  ?Result Value Ref Range  ? Lactic Acid, Venous 1.1 0.5 - 1.9 mmol/L  ?  Comment: Performed at [facility], [address], [place], [place] [address]  ?Comprehensive metabolic panel     Status: Abnormal  ? Collection Time: 11/08/21  3:56 PM  ?Result Value Ref Range  ? Sodium 130 (L) 135 - 145 mmol/L  ? Potassium 3.9 3.5 - 5.1 mmol/L  ? Chloride 96 (L) 98 - 111 mmol/L  ? CO2 22 22 - 32 mmol/L  ? Glucose, Bld 377 (H) 70 - 99 mg/dL  ?  Comment: Glucose reference range applies only to samples taken after fasting for at least 8 hours.  ? BUN 20 6 - 20 mg/dL  ? Creatinine, Ser 0.70 0.61 - 1.24 mg/dL  ? Calcium 8.4 (L) 8.9 - 10.3 mg/dL  ? Total Protein 6.5 6.5 - 8.1 g/dL  ? Albumin 3.1 (L) 3.5 - 5.0 g/dL  ? AST 16 15 - 41 U/L  ? ALT 21 0 - 44 U/L  ? Alkaline Phosphatase 112 38 - 126 U/L  ? Total  Bilirubin 0.8 0.3 - 1.2 mg/dL  ? GFR, Estimated >60 >60 mL/min  ?  Comment: (NOTE) ?Calculated using the CKD-EPI Creatinine Equation (2021) ?  ? Anion gap 12 5 - 15  ?  Comment: Performed at [facility], [address], [place], [place] [address]  ?CBC with Differential     Status: Abnormal  ? Collection Time: 11/08/21  3:56 PM  ?Result Value Ref Range  ? WBC 6.7 4.0 - 10.5 K/uL  ? RBC 4.11 (L) 4.22 - 5.81 MIL/uL  ? Hemoglobin 13.6 13.0 - 17.0 g/dL  ? HCT 38.2 (L) 39.0 - 52.0 %  ? MCV 92.9 80.0 - 100.0 fL  ? MCH 33.1 26.0 - 34.0 pg  ? MCHC 35.6 30.0 - 36.0 g/dL  ? RDW 11.9 11.5 - 15.5 %  ? Platelets 302 150 - 400 K/uL  ? nRBC 0.0 0.0 - 0.2 %  ? Neutrophils Relative % 61 %  ? Neutro Abs 4.1 1.7 - 7.7 K/uL  ? Lymphocytes Relative 21 %  ? Lymphs Abs 1.4 0.7 - 4.0 K/uL  ? Monocytes Relative 14 %  ? Monocytes Absolute 0.9 0.1 - 1.0 K/uL  ? Eosinophils Relative 3 %  ?  Eosinophils Absolute 0.2 0.0 - 0.5 K/uL  ? Basophils Relative 0 %  ? Basophils Absolute 0.0 0.0 - 0.1 K/uL  ? Immature Granulocytes 1 %  ? Abs Immature Granulocytes 0.04 0.00 - 0.07 K/uL  ?  Comment: Performed at [facility], [address] [place], [place] [address]  ?Blood culture (routine x 2)     Status: None (Preliminary result)  ? Collection Time: 11/08/21  5:00 PM  ? Specimen: Right Antecubital; Blood  ?Result Value Ref Range  ? Specimen Description RIGHT ANTECUBITAL   ? Special Requests    ?  BOTTLES DRAWN AEROBIC AND ANAEROBIC Blood Culture results may not be optimal due to an inadequate volume of blood received in culture bottles  ? Culture    ?  NO GROWTH < 24 HOURS ?Performed at [facility], [address] [place], [place] [address] ?  ? Report Status PENDING   ?Blood culture (routine x 2)     Status: None (Preliminary result)  ? Collection Time: 11/08/21  5:00 PM  ? Specimen: BLOOD RIGHT FOREARM  ?Result Value Ref Range  ? Specimen Description BLOOD RIGHT FOREARM   ? Special Requests    ?  BOTTLES DRAWN AEROBIC  AND ANAEROBIC Blood Culture results may not be optimal due to an inadequate volume of blood received in culture bottles  ? Culture    ?  NO GROWTH < 24 HOURS ?Performed at [facility], [address] [place], [place] [address] ?  ? Report Status PENDING   ?Lactic acid, plasma     Status: Abnormal  ? Collection Time: 11/08/21  5:11 PM  ?Result Value Ref Range  ? Lactic Acid, Venous 5.4 (HH) 0.5 - 1.9 mmol/L  ?  Comment: CRITICAL RESULT CALLED TO, READ BACK BY AND VERIFIED WITH ?LINDA MCCLAMB @1758  ON 11/08/21 SKL ?Performed at [facility], [address] [place], [place] [address] ?  ?CBG monitoring, ED     Status: Abnormal  ? Collection Time: 11/08/21  9:21 PM  ?Result Value Ref Range  ? Glucose-Capillary 339 (H) 70 - 99 mg/dL  ?  Comment: Glucose reference range applies only to samples taken after fasting for at least 8 hours.  ?CBG monitoring, ED     Status: Abnormal  ? Collection Time: 11/09/21 12:17 AM  ?Result Value Ref Range  ? Glucose-Capillary 350 (H) 70 - 99 mg/dL  ?  Comment: Glucose reference range applies only to samples taken after fasting for at least 8 hours.  ?Protime-INR     Status: None  ? Collection Time: 11/09/21  3:51 AM  ?Result Value Ref Range  ? Prothrombin Time 12.1 11.4 - 15.2 seconds  ? INR 0.9 0.8 - 1.2  ?  Comment: (NOTE) ?INR goal varies based on device and disease states. ?Performed at [facility], [address] [place], [place] [address] ?  ?APTT     Status: Abnormal  ? Collection Time: 11/09/21  3:51 AM  ?Result Value Ref Range  ? aPTT 22 (L) 24 - 36 seconds  ?  Comment: Performed at [facility], [address] [place], [place] [address]  ?Lactic acid, plasma     Status: None  ? Collection Time: 11/09/21  3:51 AM  ?Result Value Ref Range  ? Lactic Acid, Venous 1.9 0.5 - 1.9 mmol/L  ?  Comment: Performed at [facility], [address] [place], [place] [address]  ?Hemoglobin A1c     Status: Abnormal  ?  Collection Time:  11/09/21  3:51 AM  ?Result Value Ref Range  ? Hgb A1c MFr Bld 9.7 (H) 4.8 - 5.6 %  ?  Comment: (NOTE) ?Pre diabetes:          5.7%-6.4% ? ?Diabetes:              >6.4% ? ?Glycemic control for   <7.0% ?adults with diabetes ?  ? Mean Plasma Glucose 231.69 mg/dL  ?  Comment: Performed at [facility], [address], [place], [place] [address]  ?CBC     Status: Abnormal  ? Collection Time: 11/09/21  3:51 AM  ?Result Value Ref Range  ? WBC 4.3 4.0 - 10.5 K/uL  ? RBC 3.50 (L) 4.22 - 5.81 MIL/uL  ? Hemoglobin 11.6 (L) 13.0 - 17.0 g/dL  ? HCT 33.3 (L) 39.0 - 52.0 %  ? MCV 95.1 80.0 - 100.0 fL  ? MCH 33.1 26.0 - 34.0 pg  ? MCHC 34.8 30.0 - 36.0 g/dL  ? RDW 12.0 11.5 - 15.5 %  ? Platelets 216 150 - 400 K/uL  ? nRBC 0.0 0.0 - 0.2 %  ?  Comment: Performed at [facility], [address] [place], [place] [address]  ?Basic metabolic panel     Status: Abnormal  ? Collection Time: 11/09/21  3:51 AM  ?Result Value Ref Range  ? Sodium 139 135 - 145 mmol/L  ? Potassium 3.3 (L) 3.5 - 5.1 mmol/L  ? Chloride 104 98 - 111 mmol/L  ? CO2 29 22 - 32 mmol/L  ? Glucose, Bld 156 (H) 70 - 99 mg/dL  ?  Comment: Glucose reference range applies only to samples taken after fasting for at least 8 hours.  ? BUN 12 6 - 20 mg/dL  ? Creatinine, Ser 0.45 (L) 0.61 - 1.24 mg/dL  ? Calcium 8.0 (L) 8.9 - 10.3 mg/dL  ? GFR, Estimated >60 >60 mL/min  ?  Comment: (NOTE) ?Calculated using the CKD-EPI Creatinine Equation (2021) ?  ? Anion gap 6 5 - 15  ?  Comment: Performed at [facility], [address], [place], [place] [address]  ?CBG monitoring, ED     Status: Abnormal  ? Collection Time: 11/09/21  8:33 AM  ?Result Value Ref Range  ? Glucose-Capillary 100 (H) 70 - 99 mg/dL  ?  Comment: Glucose reference range applies only to samples taken after fasting for at least 8 hours.  ?CBG monitoring, ED     Status: Abnormal  ? Collection Time: 11/09/21 12:18 PM  ?Result Value Ref Range  ? Glucose-Capillary 181 (H) 70 - 99 mg/dL  ?  Comment:  Glucose reference range applies only to samples taken after fasting for at least 8 hours.  ? Comment 1 Notify RN   ?Glucose, capillary     Status: None  ? Collection Time: 11/09/21  4:29 PM  ?Result Value R

## 2021-11-10 NOTE — Anesthesia Postprocedure Evaluation (Signed)
Anesthesia Post Note ? ?Patient: Jesus Ewing ? ?Procedure(s) Performed: AMPUTATION TOE (Right: Toe) ? ?Patient location during evaluation: PACU ?Anesthesia Type: General ?Level of consciousness: awake and alert ?Pain management: pain level controlled ?Vital Signs Assessment: post-procedure vital signs reviewed and stable ?Respiratory status: spontaneous breathing, nonlabored ventilation, respiratory function stable and patient connected to nasal cannula oxygen ?Cardiovascular status: blood pressure returned to baseline and stable ?Postop Assessment: no apparent nausea or vomiting ?Anesthetic complications: no ? ? ?No notable events documented. ? ? ?Last Vitals:  ?Vitals:  ? 11/10/21 1325 11/10/21 1330  ?BP:  (!) 101/58  ?Pulse: 65 64  ?Resp: 17 16  ?Temp:    ?SpO2: 96% 95%  ?  ?Last Pain:  ?Vitals:  ? 11/10/21 1325  ?TempSrc:   ?PainSc: 0-No pain  ? ? ?  ?  ?  ?  ?  ?  ? ?[name] [name] ? ? ? ? ?

## 2021-11-10 NOTE — Progress Notes (Signed)
[place] ?Severe hyperglycemia at bedtime secondary to large oral intake when surgeon told him he could eat until midnight.  He was treated with 20 units novolog and placed on Q4H scale with being NPO.   However, he had also been given his long acting insulin as well.  CBG at 0400 159. Novolog coverage held and IV fluids changed to D5LR ?

## 2021-11-11 ENCOUNTER — Encounter: Payer: Self-pay | Admitting: Podiatry

## 2021-11-11 LAB — GLUCOSE, CAPILLARY
Glucose-Capillary: 217 mg/dL — ABNORMAL HIGH (ref 70–99)
Glucose-Capillary: 201 mg/dL — ABNORMAL HIGH (ref 70–99)
Glucose-Capillary: 128 mg/dL — ABNORMAL HIGH (ref 70–99)

## 2021-11-11 MED ORDER — GABAPENTIN 300 MG PO CAPS: 300.0000 mg | ORAL_CAPSULE | Freq: Two times a day (BID) | ORAL | 0 refills | Status: DC

## 2021-11-11 MED ORDER — OXYCODONE HCL 5 MG PO TABS
5.0000 mg | ORAL_TABLET | Freq: Four times a day (QID) | ORAL | Status: DC | PRN
  Administered 2021-11-11: 5 mg via ORAL
  Filled 2021-11-11: qty 1

## 2021-11-11 MED ORDER — OXYCODONE HCL 5 MG PO TABS: 5.0000 mg | ORAL_TABLET | Freq: Four times a day (QID) | ORAL | Status: DC | PRN

## 2021-11-11 MED ORDER — OXYCODONE HCL 5 MG PO TABS
5.0000 mg | ORAL_TABLET | Freq: Once | ORAL | Status: AC
  Administered 2021-11-11: 5 mg via ORAL
  Filled 2021-11-11: qty 1

## 2021-11-11 MED ORDER — OXYCODONE-ACETAMINOPHEN 5-325 MG PO TABS
1.0000 | ORAL_TABLET | Freq: Four times a day (QID) | ORAL | Status: DC | PRN
  Filled 2021-11-11: qty 2

## 2021-11-11 MED ORDER — ENSURE MAX PROTEIN PO LIQD: 11.0000 [oz_av] | Freq: Two times a day (BID) | ORAL | 0 refills | Status: AC

## 2021-11-11 MED ORDER — ADULT MULTIVITAMIN W/MINERALS CH: 1.0000 | ORAL_TABLET | Freq: Every day | ORAL | 0 refills | Status: DC

## 2021-11-11 MED ORDER — GABAPENTIN 300 MG PO CAPS
300.0000 mg | ORAL_CAPSULE | Freq: Two times a day (BID) | ORAL | Status: DC
  Administered 2021-11-11: 300 mg via ORAL
  Filled 2021-11-11: qty 1

## 2021-11-11 MED ORDER — OXYCODONE-ACETAMINOPHEN 5-325 MG PO TABS: 1.0000 | ORAL_TABLET | Freq: Four times a day (QID) | ORAL | 0 refills | Status: DC | PRN

## 2021-11-11 NOTE — Discharge Summary (Signed)
?Physician Discharge Summary ?  ?Patient: Jesus Ewing MRN: [id number] DOB: 2001-06-06  ?Admit date:     11/08/2021  ?Discharge date: 11/11/21  ?Discharge Physician: Emeterio Reeve  ? ?PCP: System, Provider Not In  ? ?Recommendations at discharge:  ?Patient is instructed to leave dressing in place until podiatry follow-up ?Patient to follow-up as directed with podiatry, call them or primary care ASAP with any uncontrolled pain, signs/symptoms of infection, fever, other concerns or present to emergency department if needed ? ?Discharge Diagnoses: ?Principal Problem: ?  Diabetic osteomyelitis ([place]) ?Active Problems: ?  Seizure ([place]) ?  Depression ?  Severe hyperglycemia due to diabetes mellitus ([place]) ?  Hyponatremia ? ?Resolved Problems: ?  Osteomyelitis ([place]) ? ?Hospital Course: ?Jesus Ewing is a 21 year old male with history of insulin-dependent diabetes mellitus, history of seizures, who presents to the emergency department 11/08/2021 from home for chief concerns of big toe infection at the advice of his podiatrist.  Tachycardic but otherwise vital signs stable.  Hyperglycemic, lactic acid 1.1, WBC 6.7.  Patient was given morphine, Zofran, vancomycin in ED and admitted to hospital for treatment of osteomyelitis.  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 ? ?Assessment and Plan: ? ?* Diabetic osteomyelitis ([place]) ?Treated with cefepime and vancomycin per pharmacy while inpatient ?Underwent amputation 11/10/2021 and was discharged home on p.o. antibiotics with 1 week Bactrim ?Blood cultures x2 ? ?Hyponatremia ?- Corrected serum sodium is 137, [place] 1999 ?-BMP in the a.m. ? ?Severe hyperglycemia due to diabetes mellitus ([place]) ?Importance of compliance with home medication regimen was stressed with the patient ? ?Seizure ([place]) ?- Resumed home Keppra 500 mg p.o. twice daily ?- Ativan  2 mg IV as needed for seizures and anxiety, 2 doses ordered, with instructions for nursing staff to administer within the parameters and then let provider know ? ? ? ? ?  ? ?Pain control - [employer] Controlled Substance Reporting System database was reviewed. and patient was instructed, not to drive, operate heavy machinery, perform activities at heights, swimming or participation in water activities or provide baby-sitting services while on Pain, Sleep and Anxiety Medications; until their outpatient Physician has advised to do so again. Also recommended to not to take more than prescribed Pain, Sleep and Anxiety Medications.  ?Consultants: podiatry ?Procedures performed: 11/10/2021 amputation R 1st toe ?Disposition: Home ?Diet recommendation:  ?Discharge Diet Orders (From admission, onward)  ? ?  Start     Ordered  ? 11/11/21 0000  Diet - low sodium heart healthy       ? 11/11/21 1211  ? 11/11/21 0000  Diet Carb Modified       ? 11/11/21 1211  ? ?  ?  ? ?  ? ?Carb modified diet ?DISCHARGE MEDICATION: ?Allergies as of 11/11/2021   ?No Known Allergies ?  ? ?  ?Medication List  ?  ? ?STOP taking these medications   ? ?doxycycline 100 MG capsule ?Commonly known as: VIBRAMYCIN ?  ? ?  ? ?TAKE these medications   ? ?Ensure Max Protein Liqd ?Take 330 mLs (11 oz total) by mouth 2 (two) times daily for 14 days. ?  ?gabapentin 300 MG capsule ?Commonly known as: NEURONTIN ?Take 1 capsule (300 mg total) by mouth 2 (two) times daily. ?  ?insulin lispro 100 UNIT/ML KwikPen ?Commonly known as: HUMALOG ?Inject 0-50 Units into the skin as directed. ?  ?Lantus SoloStar 100  UNIT/ML Solostar Pen ?Generic drug: insulin glargine ?Inject 50 Units into the skin at bedtime. ?  ?levETIRAcetam 500 MG tablet ?Commonly known as: KEPPRA ?Take 1 tablet (500 mg total) by mouth 2 (two) times daily. ?  ?multivitamin with minerals Tabs tablet ?Take 1 tablet by mouth daily. ?Start taking on: November 12, 2021 ?  ?oxyCODONE-acetaminophen 5-325 MG  tablet ?Commonly known as: PERCOCET/ROXICET ?Take 1-2 tablets by mouth every 6 (six) hours as needed (take 1 tablet for moderate pain level 3-6, take 2 tablets for severe pain level 7-10). ?  ?sulfamethoxazole-trimethoprim 800-160 MG tablet ?Commonly known as: BACTRIM DS ?Take 1 tablet by mouth 2 (two) times daily for 14 days. ?  ? ?  ? ?  ?  ? ? ?  ?Discharge Care Instructions  ?(From admission, onward)  ?  ? ? ?  ? ?  Start     Ordered  ? 11/11/21 0000  Leave dressing on - Keep it clean, dry, and intact until clinic visit       ? 11/11/21 1211  ? ?  ?  ? ?  ? ? ?Discharge Exam: ?Danley Danker Weights  ? 11/08/21 1554  ?Weight: 60.1 kg  ? ?Constitutional:  ?VSS, see nurse notes ?General Appearance: alert, well-developed, well-nourished, NAD ?Ears, Nose, Mouth, Throat: ?Normal appearance ?Neck: ?No masses, trachea midline ?Respiratory: ?Normal respiratory effort ?Breath sounds normal, no wheeze/rhonchi/rales ?Cardiovascular: ?S1/S2 normal, no murmur/rub/gallop auscultated ?No lower extremity edema ?Gastrointestinal: ?Nontender, no masses ?No hernia appreciated ?Musculoskeletal:  ?No clubbing/cyanosis of digits ?Bandage in place post-op R foot was not removed  ?Neurological: ?No cranial nerve deficit on limited exam ?Motor and sensation intact and symmetric ?Psychiatric: ?Normal judgment/insight ?Normal mood and affect ? ? ?Condition at discharge: good ? ?The results of significant diagnostics from this hospitalization (including imaging, microbiology, ancillary and laboratory) are listed below for reference.  ? ?Imaging Studies: ?MR TOES RIGHT W WO CONTRAST ? ?Result Date: 11/08/2021 ?CLINICAL DATA:  Erythema and pain in the great toe.  Diabetes. EXAM: MRI OF THE RIGHT TOES WITHOUT AND WITH CONTRAST TECHNIQUE: Multiplanar, multisequence MR imaging of the right forefoot/toes was performed both before and after administration of intravenous contrast. CONTRAST:  18mL GADAVIST GADOBUTROL 1 MMOL/ML IV SOLN COMPARISON:  None.  FINDINGS: Bones/Joint/Cartilage Diffuse marrow edema in the distal phalanx of the great toe with lateral cortical demineralization in the tuft and an adjacent 1.4 by 0.5 by 0.9 cm abscess in the soft tissues just lateral to the distal phalanx. The appearance is compatible with active osteomyelitis of the distal phalanx great toe. No other bony involvement identified. Ligaments The Lisfranc ligament appears intact. Muscles and Tendons Unremarkable Soft tissues Cellulitis of the great toe especially medially. Small abscess of the medial great toe, questionable extension to the right nail bed margin. Dorsal subcutaneous edema in the forefoot without associated enhancement. IMPRESSION: 1. Osteomyelitis of the distal phalanx great toe, with diffuse marrow edema and cortical demineralization in the lateral tuft. 2. 1.4 by 0.5 by 0.9 cm abscess laterally in the great toe, extending towards the right nail bed margin. 3. Cellulitis of the great toe. 4. Dorsal subcutaneous edema in the forefoot. Electronically Signed   By: Van Clines M.D.   On: 11/08/2021 17:59  ? ?DG Chest Port 1 View ? ?Result Date: 10/31/2021 ?CLINICAL DATA:  Shortness of breath with hyperglycemia. EXAM: PORTABLE CHEST 1 VIEW COMPARISON:  Portable chest 07/03/2021 FINDINGS: The heart size and mediastinal contours are within normal limits. Both lungs are clear. The visualized  skeletal structures are unremarkable. There are multiple overlying monitor wires. IMPRESSION: No evidence of acute chest disease or interval changes. Electronically Signed   By: Telford Nab M.D.   On: 10/31/2021 22:40   ? ?Microbiology: ?Results for orders placed or performed during the hospital encounter of 11/08/21  ?Blood culture (routine x 2)     Status: None (Preliminary result)  ? Collection Time: 11/08/21  5:00 PM  ? Specimen: Right Antecubital; Blood  ?Result Value Ref Range Status  ? Specimen Description RIGHT ANTECUBITAL  Final  ? Special Requests   Final  ?   BOTTLES DRAWN AEROBIC AND ANAEROBIC Blood Culture results may not be optimal due to an inadequate volume of blood received in culture bottles  ? Culture   Final  ?  NO GROWTH 3 DAYS ?Performed at [facility]

## 2021-11-11 NOTE — Assessment & Plan Note (Signed)
Importance of compliance with home medication regimen was stressed with the patient ?

## 2021-11-11 NOTE — Plan of Care (Signed)
Patient discharge instruction reviewed and boot use reviewed with patient who verbalized understanding.  PIV removed x2.   ?

## 2021-11-13 LAB — CULTURE, BLOOD (ROUTINE X 2)
Culture: NO GROWTH
Culture: NO GROWTH

## 2021-11-14 LAB — SURGICAL PATHOLOGY

## 2021-11-17 ENCOUNTER — Ambulatory Visit (INDEPENDENT_AMBULATORY_CARE_PROVIDER_SITE_OTHER): Payer: 59 | Admitting: Podiatry

## 2021-11-17 DIAGNOSIS — Z89419 Acquired absence of unspecified great toe: Secondary | ICD-10-CM | POA: Diagnosis not present

## 2021-11-17 MED ORDER — GABAPENTIN 300 MG PO CAPS: 300.0000 mg | ORAL_CAPSULE | Freq: Two times a day (BID) | ORAL | 0 refills | Status: DC

## 2021-11-17 MED ORDER — OXYCODONE-ACETAMINOPHEN 5-325 MG PO TABS: 1.0000 | ORAL_TABLET | ORAL | 0 refills | Status: DC | PRN

## 2021-11-17 NOTE — Progress Notes (Signed)
?  Subjective:  ?Patient ID: Jesus Ewing, male    DOB: [birth year],  MRN: [id number] ? ?Chief Complaint  ?Patient presents with  ? Routine Post Op  ? ? ?DOS: 11/10/21 ?Procedure: Right hallux amputation ? ?21 y.o. male returns for post-op check.  Patient states that he is doing well.  Minimal pain.  Pain is being controlled by pain medication.  Bandages clean dry and intact ? ?Review of Systems: Negative except as noted in the HPI. Denies N/V/F/Ch. ? ?Past Medical History:  ?Diagnosis Date  ? Celiac disease   ? Diabetes mellitus without complication ([place])   ? ? ?Current Outpatient Medications:  ?  oxyCODONE-acetaminophen (PERCOCET) 5-325 MG tablet, Take 1 tablet by mouth every 4 (four) hours as needed for severe pain., Disp: 30 tablet, Rfl: 0 ?  Ensure Max Protein (ENSURE MAX PROTEIN) LIQD, Take 330 mLs (11 oz total) by mouth 2 (two) times daily for 14 days., Disp: , Rfl: 0 ?  gabapentin (NEURONTIN) 300 MG capsule, Take 1 capsule (300 mg total) by mouth 2 (two) times daily., Disp: 60 capsule, Rfl: 0 ?  insulin lispro (HUMALOG) 100 UNIT/ML KwikPen, Inject 0-50 Units into the skin as directed., Disp: , Rfl:  ?  LANTUS SOLOSTAR 100 UNIT/ML Solostar Pen, Inject 50 Units into the skin at bedtime., Disp: , Rfl:  ?  levETIRAcetam (KEPPRA) 500 MG tablet, Take 1 tablet (500 mg total) by mouth 2 (two) times daily., Disp: 60 tablet, Rfl: 0 ?  Multiple Vitamin (MULTIVITAMIN WITH MINERALS) TABS tablet, Take 1 tablet by mouth daily., Disp: 30 tablet, Rfl: 0 ?  oxyCODONE-acetaminophen (PERCOCET/ROXICET) 5-325 MG tablet, Take 1-2 tablets by mouth every 6 (six) hours as needed (take 1 tablet for moderate pain level 3-6, take 2 tablets for severe pain level 7-10)., Disp: 30 tablet, Rfl: 0 ?  sulfamethoxazole-trimethoprim (BACTRIM DS) 800-160 MG tablet, Take 1 tablet by mouth 2 (two) times daily for 14 days., Disp: 28 tablet, Rfl: 0 ? ?Social History  ? ?Tobacco Use  ?Smoking Status Never  ?Smokeless Tobacco Never  ? ? ?No  Known Allergies ?Objective:  ?There were no vitals filed for this visit. ?There is no height or weight on file to calculate BMI. ?Constitutional Well developed. ?Well nourished.  ?Vascular Foot warm and well perfused. ?Capillary refill normal to all digits.   ?Neurologic Normal speech. ?Oriented to person, place, and time. ?Epicritic sensation to light touch grossly present bilaterally.  ?Dermatologic Skin healing well without signs of infection. Skin edges well coapted without signs of infection.  ?Orthopedic: Tenderness to palpation noted about the surgical site.  ? ?Radiographs: None ?Assessment:  ? ?1. History of amputation of great toe ([place])   ? ?Plan:  ?Patient was evaluated and treated and all questions answered. ? ?S/p foot surgery right ?-Progressing as expected post-operatively. ?-XR: See above ?-WB Status: Weightbearing as tolerated in surgical shoe ?-Sutures: Intact.  No clinical signs of [place] is noted.  No complication noted. ?-Medications: None ?-Foot redressed. ?-We will get x-ray during next clinical visit ? ?No follow-ups on file.  ?

## 2021-11-30 ENCOUNTER — Ambulatory Visit (INDEPENDENT_AMBULATORY_CARE_PROVIDER_SITE_OTHER): Payer: 59 | Admitting: Podiatry

## 2021-11-30 DIAGNOSIS — Z89419 Acquired absence of unspecified great toe: Secondary | ICD-10-CM | POA: Diagnosis not present

## 2021-12-05 ENCOUNTER — Encounter: Payer: Self-pay | Admitting: Podiatry

## 2021-12-05 NOTE — Progress Notes (Signed)
  Subjective:  Patient ID: Jesus Ewing, male    DOB: 15-Dec-2000,  MRN: [id number]  Chief Complaint  Patient presents with   Routine Post Op    DOS: 10/21/2021 Procedure: Partial right toe amputation right great toe  21 y.o. male returns for post-op check.  Doing okay does not want a look at his toe has been very painful  Review of Systems: Negative except as noted in the HPI. Denies N/V/F/Ch.   Objective:  There were no vitals filed for this visit. There is no height or weight on file to calculate BMI. Constitutional Well developed. Well nourished.  Vascular Foot warm and well perfused. Capillary refill normal to all digits.  Calf is soft and supple, no posterior calf or knee pain, negative Homans' sign  Neurologic Normal speech. Oriented to person, place, and time. Epicritic sensation to light touch grossly present bilaterally.  Dermatologic Skin healing well without signs of infection. Skin edges well coapted without signs of infection.  Orthopedic: Tenderness to palpation noted about the surgical site.    Assessment:   1. History of amputation of great toe [facility])    Plan:  Patient was evaluated and treated and all questions answered.  S/p foot surgery right -Progressing as expected post-operatively. -WB Status: WBAT in regular shoe gear when tolerated -Sutures: All sutures removed today. -Medications: No refills required -Continues to be quite painful we discussed the risk of phantom pain hopeful this begins to resolve for him soon.  Return in about 3 weeks (around 12/21/2021) for post op (no x-rays).

## 2021-12-11 ENCOUNTER — Inpatient Hospital Stay: Payer: 59

## 2021-12-11 ENCOUNTER — Other Ambulatory Visit: Payer: Self-pay

## 2021-12-11 ENCOUNTER — Encounter: Payer: Self-pay | Admitting: Intensive Care

## 2021-12-11 ENCOUNTER — Inpatient Hospital Stay
Admission: EM | Admit: 2021-12-11 | Discharge: 2021-12-13 | DRG: 639 | Disposition: A | Discharge status: Home / Self Care | Payer: 59 | Attending: Osteopathic Medicine | Admitting: Osteopathic Medicine

## 2021-12-11 DIAGNOSIS — F1020 Alcohol dependence, uncomplicated: Secondary | ICD-10-CM | POA: Diagnosis present

## 2021-12-11 DIAGNOSIS — Z91148 Patient's other noncompliance with medication regimen for other reason: Secondary | ICD-10-CM | POA: Diagnosis not present

## 2021-12-11 DIAGNOSIS — F1410 Cocaine abuse, uncomplicated: Secondary | ICD-10-CM

## 2021-12-11 DIAGNOSIS — Z89412 Acquired absence of left great toe: Secondary | ICD-10-CM

## 2021-12-11 DIAGNOSIS — Z79899 Other long term (current) drug therapy: Secondary | ICD-10-CM | POA: Diagnosis not present

## 2021-12-11 DIAGNOSIS — E1169 Type 2 diabetes mellitus with other specified complication: Secondary | ICD-10-CM | POA: Diagnosis present

## 2021-12-11 DIAGNOSIS — F32A Depression, unspecified: Secondary | ICD-10-CM | POA: Diagnosis present

## 2021-12-11 DIAGNOSIS — F1414 Cocaine abuse with cocaine-induced mood disorder: Secondary | ICD-10-CM | POA: Diagnosis present

## 2021-12-11 DIAGNOSIS — Z7151 Drug abuse counseling and surveillance of drug abuser: Secondary | ICD-10-CM | POA: Diagnosis not present

## 2021-12-11 DIAGNOSIS — T383X6A Underdosing of insulin and oral hypoglycemic [antidiabetic] drugs, initial encounter: Secondary | ICD-10-CM | POA: Diagnosis present

## 2021-12-11 DIAGNOSIS — F419 Anxiety disorder, unspecified: Secondary | ICD-10-CM | POA: Diagnosis present

## 2021-12-11 DIAGNOSIS — Z89421 Acquired absence of other right toe(s): Secondary | ICD-10-CM | POA: Diagnosis not present

## 2021-12-11 DIAGNOSIS — E1010 Type 1 diabetes mellitus with ketoacidosis without coma: Secondary | ICD-10-CM | POA: Diagnosis present

## 2021-12-11 DIAGNOSIS — Z9151 Personal history of suicidal behavior: Secondary | ICD-10-CM

## 2021-12-11 DIAGNOSIS — Z794 Long term (current) use of insulin: Secondary | ICD-10-CM

## 2021-12-11 DIAGNOSIS — K900 Celiac disease: Secondary | ICD-10-CM | POA: Diagnosis present

## 2021-12-11 DIAGNOSIS — E1110 Type 2 diabetes mellitus with ketoacidosis without coma: Secondary | ICD-10-CM | POA: Diagnosis present

## 2021-12-11 DIAGNOSIS — F1910 Other psychoactive substance abuse, uncomplicated: Secondary | ICD-10-CM | POA: Diagnosis present

## 2021-12-11 DIAGNOSIS — E109 Type 1 diabetes mellitus without complications: Secondary | ICD-10-CM | POA: Diagnosis present

## 2021-12-11 LAB — CBC
HCT: 53 % — ABNORMAL HIGH (ref 39.0–52.0)
Hemoglobin: 17.8 g/dL — ABNORMAL HIGH (ref 13.0–17.0)
MCH: 32.7 pg (ref 26.0–34.0)
MCHC: 33.6 g/dL (ref 30.0–36.0)
MCV: 97.2 fL (ref 80.0–100.0)
Platelets: 391 10*3/uL (ref 150–400)
RBC: 5.45 MIL/uL (ref 4.22–5.81)
RDW: 12 % (ref 11.5–15.5)
WBC: 8.2 10*3/uL (ref 4.0–10.5)
nRBC: 0 % (ref 0.0–0.2)

## 2021-12-11 LAB — URINE DRUG SCREEN, QUALITATIVE (ARMC ONLY)
Amphetamines, Ur Screen: POSITIVE — AB
Barbiturates, Ur Screen: NOT DETECTED
Benzodiazepine, Ur Scrn: NOT DETECTED
Cannabinoid 50 Ng, Ur ~~LOC~~: NOT DETECTED
Cocaine Metabolite,Ur ~~LOC~~: NOT DETECTED
MDMA (Ecstasy)Ur Screen: NOT DETECTED
Methadone Scn, Ur: NOT DETECTED
Opiate, Ur Screen: NOT DETECTED
Phencyclidine (PCP) Ur S: NOT DETECTED
Tricyclic, Ur Screen: NOT DETECTED

## 2021-12-11 LAB — URINALYSIS, ROUTINE W REFLEX MICROSCOPIC
Bacteria, UA: NONE SEEN
Bilirubin Urine: NEGATIVE
Glucose, UA: >=500 mg/dL — AB
Hgb urine dipstick: NEGATIVE
Ketones, ur: 80 mg/dL — AB
Leukocytes,Ua: NEGATIVE
Nitrite: NEGATIVE
Protein, ur: 30 mg/dL — AB
Specific Gravity, Urine: 1.025 (ref 1.005–1.030)
pH: 5 (ref 5.0–8.0)

## 2021-12-11 LAB — CBG MONITORING, ED
Glucose-Capillary: 489 mg/dL — ABNORMAL HIGH (ref 70–99)
Glucose-Capillary: 441 mg/dL — ABNORMAL HIGH (ref 70–99)
Glucose-Capillary: 321 mg/dL — ABNORMAL HIGH (ref 70–99)
Glucose-Capillary: 134 mg/dL — ABNORMAL HIGH (ref 70–99)
Glucose-Capillary: 144 mg/dL — ABNORMAL HIGH (ref 70–99)
Glucose-Capillary: 208 mg/dL — ABNORMAL HIGH (ref 70–99)
Glucose-Capillary: 235 mg/dL — ABNORMAL HIGH (ref 70–99)

## 2021-12-11 LAB — CBC WITH DIFFERENTIAL/PLATELET
Abs Immature Granulocytes: 0.13 10*3/uL — ABNORMAL HIGH (ref 0.00–0.07)
Basophils Absolute: 0.1 10*3/uL (ref 0.0–0.1)
Basophils Relative: 1 %
Eosinophils Absolute: 0.4 10*3/uL (ref 0.0–0.5)
Eosinophils Relative: 5 %
HCT: 59.4 % — ABNORMAL HIGH (ref 39.0–52.0)
Hemoglobin: 19.5 g/dL — ABNORMAL HIGH (ref 13.0–17.0)
Immature Granulocytes: 2 %
Lymphocytes Relative: 33 %
Lymphs Abs: 2.7 10*3/uL (ref 0.7–4.0)
MCH: 32.5 pg (ref 26.0–34.0)
MCHC: 32.8 g/dL (ref 30.0–36.0)
MCV: 99 fL (ref 80.0–100.0)
Monocytes Absolute: 0.5 10*3/uL (ref 0.1–1.0)
Monocytes Relative: 6 %
Neutro Abs: 4.5 10*3/uL (ref 1.7–7.7)
Neutrophils Relative %: 53 %
Platelets: 376 10*3/uL (ref 150–400)
RBC: 6 MIL/uL — ABNORMAL HIGH (ref 4.22–5.81)
RDW: 11.9 % (ref 11.5–15.5)
WBC: 8.4 10*3/uL (ref 4.0–10.5)
nRBC: 0 % (ref 0.0–0.2)

## 2021-12-11 LAB — BLOOD GAS, VENOUS
Acid-base deficit: 20.9 mmol/L — ABNORMAL HIGH (ref 0.0–2.0)
Bicarbonate: 9.3 mmol/L — ABNORMAL LOW (ref 20.0–28.0)
O2 Saturation: 61.4 %
Patient temperature: 37
pCO2, Ven: 36 mmHg — ABNORMAL LOW (ref 44–60)
pH, Ven: 7.02 — CL (ref 7.25–7.43)
pO2, Ven: 40 mmHg (ref 32–45)

## 2021-12-11 LAB — BASIC METABOLIC PANEL
Anion gap: 18 — ABNORMAL HIGH (ref 5–15)
BUN: 21 mg/dL — ABNORMAL HIGH (ref 6–20)
CO2: 15 mmol/L — ABNORMAL LOW (ref 22–32)
Calcium: 8.9 mg/dL (ref 8.9–10.3)
Chloride: 100 mmol/L (ref 98–111)
Creatinine, Ser: 0.92 mg/dL (ref 0.61–1.24)
GFR, Estimated: >60 mL/min (ref >60)
Glucose, Bld: 212 mg/dL — ABNORMAL HIGH (ref 70–99)
Potassium: 4.7 mmol/L (ref 3.5–5.1)
Sodium: 133 mmol/L — ABNORMAL LOW (ref 135–145)

## 2021-12-11 LAB — COMPREHENSIVE METABOLIC PANEL
ALT: 35 U/L (ref 0–44)
AST: 13 U/L — ABNORMAL LOW (ref 15–41)
Albumin: 4.5 g/dL (ref 3.5–5.0)
Alkaline Phosphatase: 143 U/L — ABNORMAL HIGH (ref 38–126)
Anion gap: 24 — ABNORMAL HIGH (ref 5–15)
BUN: 26 mg/dL — ABNORMAL HIGH (ref 6–20)
CO2: 8 mmol/L — ABNORMAL LOW (ref 22–32)
Calcium: 8.9 mg/dL (ref 8.9–10.3)
Chloride: 96 mmol/L — ABNORMAL LOW (ref 98–111)
Creatinine, Ser: 1.02 mg/dL (ref 0.61–1.24)
GFR, Estimated: >60 mL/min (ref >60)
Glucose, Bld: 434 mg/dL — ABNORMAL HIGH (ref 70–99)
Potassium: 4.9 mmol/L (ref 3.5–5.1)
Sodium: 128 mmol/L — ABNORMAL LOW (ref 135–145)
Total Bilirubin: 2.4 mg/dL — ABNORMAL HIGH (ref 0.3–1.2)
Total Protein: 8.4 g/dL — ABNORMAL HIGH (ref 6.5–8.1)

## 2021-12-11 LAB — BETA-HYDROXYBUTYRIC ACID
Beta-Hydroxybutyric Acid: >8 mmol/L — ABNORMAL HIGH (ref 0.05–0.27)
Beta-Hydroxybutyric Acid: 5.82 mmol/L — ABNORMAL HIGH (ref 0.05–0.27)

## 2021-12-11 LAB — LACTIC ACID, PLASMA
Lactic Acid, Venous: 1.6 mmol/L (ref 0.5–1.9)
Lactic Acid, Venous: 1.6 mmol/L (ref 0.5–1.9)

## 2021-12-11 LAB — GLUCOSE, CAPILLARY: Glucose-Capillary: 183 mg/dL — ABNORMAL HIGH (ref 70–99)

## 2021-12-11 MED ORDER — PHENOL 1.4 % MT LIQD: 1.0000 | OROMUCOSAL | Status: DC | PRN

## 2021-12-11 MED ORDER — LACTATED RINGERS IV SOLN: INTRAVENOUS | Status: DC

## 2021-12-11 MED ORDER — ESCITALOPRAM OXALATE 10 MG PO TABS
10.0000 mg | ORAL_TABLET | Freq: Every day | ORAL | Status: DC
  Administered 2021-12-12 – 2021-12-13 (×2): 10 mg via ORAL
  Filled 2021-12-11 (×2): qty 1

## 2021-12-11 MED ORDER — LEVETIRACETAM 500 MG PO TABS
500.0000 mg | ORAL_TABLET | Freq: Two times a day (BID) | ORAL | Status: DC
  Administered 2021-12-12 – 2021-12-13 (×3): 500 mg via ORAL
  Filled 2021-12-11 (×3): qty 1

## 2021-12-11 MED ORDER — GABAPENTIN 300 MG PO CAPS
300.0000 mg | ORAL_CAPSULE | Freq: Two times a day (BID) | ORAL | Status: DC
  Administered 2021-12-12 – 2021-12-13 (×3): 300 mg via ORAL
  Filled 2021-12-11 (×3): qty 1

## 2021-12-11 MED ORDER — POTASSIUM CHLORIDE 10 MEQ/100ML IV SOLN
10.0000 meq | INTRAVENOUS | Status: AC
  Administered 2021-12-11: 10 meq via INTRAVENOUS
  Filled 2021-12-11: qty 100

## 2021-12-11 MED ORDER — POTASSIUM CHLORIDE 10 MEQ/100ML IV SOLN
10.0000 meq | INTRAVENOUS | Status: AC
  Administered 2021-12-11 (×2): 10 meq via INTRAVENOUS
  Filled 2021-12-11 (×2): qty 100

## 2021-12-11 MED ORDER — SODIUM CHLORIDE 0.9 % IV SOLN
8.0000 mg | Freq: Once | INTRAVENOUS | Status: DC
  Filled 2021-12-11: qty 4

## 2021-12-11 MED ORDER — DEXTROSE IN LACTATED RINGERS 5 % IV SOLN: INTRAVENOUS | Status: DC

## 2021-12-11 MED ORDER — INSULIN REGULAR(HUMAN) IN NACL 100-0.9 UT/100ML-% IV SOLN: INTRAVENOUS | Status: DC

## 2021-12-11 MED ORDER — DEXTROSE 50 % IV SOLN: 0.0000 mL | INTRAVENOUS | Status: DC | PRN

## 2021-12-11 MED ORDER — ENOXAPARIN SODIUM 40 MG/0.4ML IJ SOSY
40.0000 mg | PREFILLED_SYRINGE | INTRAMUSCULAR | Status: DC
  Filled 2021-12-11: qty 0.4

## 2021-12-11 MED ORDER — INSULIN REGULAR(HUMAN) IN NACL 100-0.9 UT/100ML-% IV SOLN
INTRAVENOUS | Status: AC
  Administered 2021-12-11: 8 [IU]/h via INTRAVENOUS
  Filled 2021-12-11: qty 100

## 2021-12-11 MED ORDER — SODIUM CHLORIDE 0.9 % IV BOLUS
1000.0000 mL | Freq: Once | INTRAVENOUS | Status: AC
  Administered 2021-12-11: 1000 mL via INTRAVENOUS

## 2021-12-11 MED ORDER — LACTATED RINGERS IV BOLUS: 20.0000 mL/kg | Freq: Once | INTRAVENOUS | Status: DC

## 2021-12-11 MED ORDER — LORAZEPAM 2 MG/ML IJ SOLN
0.5000 mg | Freq: Once | INTRAMUSCULAR | Status: AC
  Administered 2021-12-11: 0.5 mg via INTRAVENOUS
  Filled 2021-12-11: qty 1

## 2021-12-11 MED ORDER — ADULT MULTIVITAMIN W/MINERALS CH
1.0000 | ORAL_TABLET | Freq: Every day | ORAL | Status: DC
  Administered 2021-12-12 – 2021-12-13 (×2): 1 via ORAL
  Filled 2021-12-11 (×2): qty 1

## 2021-12-11 NOTE — ED Notes (Addendum)
Pt and visitors (parents) heard yelling in room, this tech entered room to find Pt and Pt's parents yelling. Yelling stopped when this tech entered room, this tech asked Pt why he was yelling, no response from Pt. Pt's mother states that they are waiting for the MD. This tech informed Pt and Pt's parents that their yelling is disruptive to other patients. Pt and parents informed that this tech will inform RN of their needs. RN Tresa Endo made aware.

## 2021-12-11 NOTE — ED Notes (Signed)
This RN attempted [place] IV. Pt verbally demanding to stop with insertion and refusing attempt.

## 2021-12-11 NOTE — ED Notes (Signed)
This RN at bedside to ask if IV team could [place] IV - family agreeable but pt refuses at this time.

## 2021-12-11 NOTE — ED Notes (Signed)
Introduced myself to pt and his family, x-ray is at the bedside attempting to obtain a CXR and toe x-ray. Pt is not following commands and states "just give me 5 minutes to sleep". Encouraged pt to allow me to place his pulse ox back on. Pt was compliant, but states that he's hungry and thirsty. Advised family that I will check with admitting provider and see about pt possibly getting something small to eat and water. Pt is agreeable.

## 2021-12-11 NOTE — ED Notes (Signed)
Lab at bedside to collect blood.  

## 2021-12-11 NOTE — ED Notes (Signed)
MD at bedside. Pt agreeable to blood draw with MD and this RN made aware via secure chat. This RN unable to get into due to being in a critical pt's room. Float RN sent to draw blood works at this time.

## 2021-12-11 NOTE — ED Notes (Addendum)
This RN attempted IV access x2 without success. Will have another RN attempt

## 2021-12-11 NOTE — ED Triage Notes (Signed)
Patient arrived by EMS from home. Mom reports patient has been gone for days. Patient states he went on a "6 day cocaine binge." HX diabetes and right toe amputation. Mom reports he gave himself insulin this AM but unsure how much. Patient denies pain. Pt c/o cotton mouth and nausea

## 2021-12-11 NOTE — ED Notes (Addendum)
Another RN at bedside to attempt [place] IV for second line. Pt began yelling at RN during [place] IV attempt. Attempt unsuccessful and pt refusing another attempt.

## 2021-12-11 NOTE — ED Notes (Addendum)
Lab called for blood draw.

## 2021-12-11 NOTE — Progress Notes (Signed)
       CROSS COVER NOTE  NAME: Jesus Ewing MRN: [id number] DOB : Apr 25, 2001    Date of Service   12/11/2021  HPI/Events of Note   Pt admitted with DKA. Insulin drip stopped today at 1506 prior to transition orders being placed by hospitalist.    Interventions   Plan: ABG, BMP, Beta-Hydroxy Anion Gap 18, Beta-Hydroxybutyric Acid 5.82 Restart Insulin gtt, transition to subcutaneous insulin once Anion gap closed       Fidel Levy, MHA, FNP-BC Nurse Practitioner [facility] Pager [phone]

## 2021-12-11 NOTE — ED Notes (Signed)
This RN is unable to draw blood.

## 2021-12-11 NOTE — ED Notes (Signed)
MD aware of RN unable to get repeat blood due to pt refusing.

## 2021-12-11 NOTE — Progress Notes (Signed)
Spoke with lab. No blood draw yet, reached out to RN who will call me back as she is busy currently. Awaiting to hear back.   Darrick Penna, PharmD, MS PGPM Clinical Pharmacist 12/11/2021 7:51 PM

## 2021-12-11 NOTE — H&P (Signed)
History and Physical    Jesus Ewing OIZ:[id number] DOB: 2000/12/21 DOA: 12/11/2021  PCP: System, Provider Not In  Patient coming from: home  I have personally briefly reviewed patient's old medical records in [facility]  Chief Complaint: nausea vomiting   HPI: Jesus Ewing is a 21 y.o. male with medical history significant of DM type I,celiac disease, cocaine abuse presents to ed BIB EMS due to persistent n/v at home. Per notes Patient has note been home for days and returns home after 6 days cocaine binge per patient with n/v. Patient notes he has not taken any of his insulin during that time. Patient parents at bedside have requested  psych consult. Patient currently noted he is depressed but has no SI.  Patient at times is willing th participate in care and at others refuses care but eventually is able to be convinced to get labs draws and iv placed.  He also has great toe partial amputation with removal of sutures 2 weeks ago. He notes some pain but otherwise no complaints. He intermittently give history to difficult to get full ros.   ED Course:  IN ED vitals: Afeb, bp 97/70, hr 105, rr 20 sat 97% on ra  Labs Glu 489 Vbg:7/36 Lactic 1.6 Na :128, K 4.9, bicarg 8, glyc 434, cr 1.02 (0.42)  AG24 Wbc: 8.4,hgb 19.5 Beta-hydroxybutryric acid:>8  Tx ns 1L , ativan iv 0.5mg  x 1 Potassium Insulin drip per protocol  EKG: sinus no st -t wave changes      Review of Systems: As per HPI , due to cooperation not able to obtain full ros Past Medical History:  Diagnosis Date   Celiac disease    Diabetes mellitus without complication [facility])     Past Surgical History:  Procedure Laterality Date   AMPUTATION TOE Right 11/10/2021   Procedure: AMPUTATION TOE;  Surgeon: Candelaria Stagers, DPM;  Location: [facility] ORS;  Service: Podiatry;  Laterality: Right;   NO PAST SURGERIES       reports that he has never smoked. He has never used smokeless tobacco. He  reports current alcohol use. He reports current drug use. Drug: Cocaine.  No Known Allergies  History reviewed. No pertinent family history.  Prior to Admission medications   Medication Sig Start Date End Date Taking? Authorizing Provider  escitalopram (LEXAPRO) 10 MG tablet Take 10 mg by mouth daily. 11/22/21  Yes [provider]  gabapentin (NEURONTIN) 300 MG capsule Take 1 capsule (300 mg total) by mouth 2 (two) times daily. 11/17/21  Yes Candelaria Stagers, DPM  insulin lispro (HUMALOG) 100 UNIT/ML KwikPen Inject 0-50 Units into the skin as directed. 07/24/18  Yes [provider]  levETIRAcetam (KEPPRA) 500 MG tablet Take 1 tablet (500 mg total) by mouth 2 (two) times daily. 07/05/21 12/11/21 Yes Arnetha Courser, MD  Multiple Vitamin (MULTIVITAMIN WITH MINERALS) TABS tablet Take 1 tablet by mouth daily. 11/12/21  Yes Sunnie Nielsen, DO  oxyCODONE-acetaminophen (PERCOCET) 5-325 MG tablet Take 1 tablet by mouth every 4 (four) hours as needed for severe pain. 11/17/21  Yes Candelaria Stagers, DPM  oxyCODONE-acetaminophen (PERCOCET/ROXICET) 5-325 MG tablet Take 1-2 tablets by mouth every 6 (six) hours as needed (take 1 tablet for moderate pain level 3-6, take 2 tablets for severe pain level 7-10). 11/11/21  Yes Alexander, Dorene Grebe, DO  LANTUS SOLOSTAR 100 UNIT/ML Solostar Pen Inject 50 Units into the skin at bedtime. Patient not taking: Reported on 12/11/2021 05/04/19   [provider]    Physical Exam: Vitals:   12/11/21 1030 12/11/21 1100 12/11/21 1130 12/11/21 1220  BP: 119/80 123/84 (!) 139/99 120/78  Pulse: (!) 101 91 (!) 123 (!) 101  Resp: 18 16 (!) 32 18  Temp:      TempSrc:      SpO2: 100% 100% 95% 100%  Weight:      Height:         Vitals:   12/11/21 1030 12/11/21 1100 12/11/21 1130 12/11/21 1220  BP: 119/80 123/84 (!) 139/99 120/78  Pulse: (!) 101 91 (!) 123 (!) 101  Resp: 18 16 (!) 32 18  Temp:      TempSrc:      SpO2: 100% 100% 95% 100%  Weight:       Height:      Constitutional: NAD, calm, comfortable Eyes: PERRL, lids and conjunctivae normal ENMT: Mucous membranes are moist. Posterior pharynx clear of any exudate or lesions.Normal dentition.  Neck: normal, supple, no masses, no thyromegaly Respiratory: clear to auscultation bilaterally, no wheezing, no crackles. Normal respiratory effort. No accessory muscle use.  Cardiovascular: Regular rate and rhythm, no murmurs / rubs / gallops. No extremity edema. 2+ pedal pulses. No carotid bruits.  Abdomen: no tenderness, no masses palpated. No hepatosplenomegaly. Bowel sounds positive.  Musculoskeletal: no clubbing / cyanosis. Partial great toe amputation. Good ROM, no contractures. Normal muscle tone.  Skin: no rashes, lesions, ulcers. No induration Neurologic: CN 2-12 grossly intact. Sensation intact,  Strength 5/5 in all 4.  Psychiatric: Normal judgment and insight. Alert to self and place. Depressed, labile mood   Labs on Admission: I have personally reviewed following labs and imaging studies  CBC: Recent Labs  Lab 12/11/21 1122  WBC 8.4  NEUTROABS 4.5  HGB 19.5*  HCT 59.4*  MCV 99.0  PLT 376   Basic Metabolic Panel: Recent Labs  Lab 12/11/21 1122  NA 128*  K 4.9  CL 96*  CO2 8*  GLUCOSE 434*  BUN 26*  CREATININE 1.02  CALCIUM 8.9   GFR: Estimated Creatinine Clearance: 86 mL/min (by C-G formula based on SCr of 1.02 mg/dL). Liver Function Tests: Recent Labs  Lab 12/11/21 1122  AST 13*  ALT 35  ALKPHOS 143*  BILITOT 2.4*  PROT 8.4*  ALBUMIN 4.5   No results for input(s): LIPASE, AMYLASE in the last 168 hours. No results for input(s): AMMONIA in the last 168 hours. Coagulation Profile: No results for input(s): INR, PROTIME in the last 168 hours. Cardiac Enzymes: No results for input(s): CKTOTAL, CKMB, CKMBINDEX, TROPONINI in the last 168 hours. BNP (last 3 results) No results for input(s): PROBNP in the last 8760 hours. HbA1C: No results for input(s):  HGBA1C in the last 72 hours. CBG: Recent Labs  Lab 12/11/21 1026 12/11/21 1214 12/11/21 1318  GLUCAP 489* 441* 321*   Lipid Profile: No results for input(s): CHOL, HDL, LDLCALC, TRIG, CHOLHDL, LDLDIRECT in the last 72 hours. Thyroid Function Tests: No results for input(s): TSH, T4TOTAL, FREET4, T3FREE, THYROIDAB in the last 72 hours. Anemia Panel: No results for input(s): VITAMINB12, FOLATE, FERRITIN, TIBC, IRON, RETICCTPCT in the last 72 hours. Urine analysis:    Component Value Date/Time   COLORURINE STRAW (A) 10/31/2021 2233   APPEARANCEUR CLEAR (A) 10/31/2021 2233   LABSPEC 1.021 10/31/2021 2233   PHURINE 5.0 10/31/2021 2233   GLUCOSEU >=500 (A) 10/31/2021 2233   HGBUR SMALL (A) 10/31/2021 2233   BILIRUBINUR NEGATIVE 10/31/2021 2233   KETONESUR 80 (A) 10/31/2021 2233  PROTEINUR 30 (A) 10/31/2021 2233   NITRITE NEGATIVE 10/31/2021 2233   LEUKOCYTESUR NEGATIVE 10/31/2021 2233    Radiological Exams on Admission: No results found.  EKG: Independently reviewed.see above   Assessment/Plan  Type1 DM with DKA -dka due to noncompliance with medication  -place on insulin drip per  dka protocol  - bmp q4h  -repeat abg now  -ivfs per protocol  -strict I/o    Substance abuse  -cocaine  -UDS /etoh level pending  -place on ciwa   Partial Great toe amputation Left -xray pending   Depression -no SI -psych consult   Celiac disease -place on gluten pre diet once patient able to tolerate po    DVT prophylaxis: lovenox Code Status: full Family Communication: n/a Disposition Plan: patient  expected to be admitted greater than 2 midnights  Consults called: n/a Admission status: inpatient    Lurline Del MD [facility]   If 7PM-7AM, please contact night-coverage [email or web address] Password [email or web address]  12/11/2021, 1:33 PM

## 2021-12-11 NOTE — ED Provider Notes (Signed)
[facility] Provider Note   Event Date/Time   First MD Initiated Contact with Patient 12/11/21 1351     (approximate) History  Withdrawal and Hyperglycemia  HPI Jesus Ewing is a 21 y.o. male with stated past medical history of type 1 diabetes and cocaine abuse who presents after a reported "bender of cocaine" in which patient did not take his normal insulin requirements.  Patient now complains of severe pain "all over" as well as nausea/vomiting.  Patient is poor historian and is unwilling to participate in any further history of review of systems at this time Physical Exam  Triage Vital Signs: ED Triage Vitals  Enc Vitals Group     BP 12/11/21 1018 97/70     Pulse Rate 12/11/21 1018 (!) 105     Resp 12/11/21 1018 20     Temp 12/11/21 1018 97.7 F (36.5 C)     Temp Source 12/11/21 1018 Oral     SpO2 12/11/21 1018 97 %     Weight 12/11/21 1019 119 lb (54 kg)     Height 12/11/21 1019  (1.778 m)     Head Circumference --      Peak Flow --      Pain Score 12/11/21 1019 0     Pain Loc --      Pain Edu? --      Excl. in GC? --    Most recent vital signs: Vitals:   12/11/21 1445 12/11/21 1500  BP:  (!) 118/91  Pulse: (!) 108 (!) 102  Resp: (!) 25 (!) 21  Temp:    SpO2: 100% 100%   General: Awake, oriented x4. CV:  Good peripheral perfusion.  Resp:  Normal effort.  Abd:  No distention.  Other:  Young adult Caucasian male laying in bed in mild distress secondary to withdrawal ED Results / Procedures / Treatments  Labs (all labs ordered are listed, but only abnormal results are displayed) Labs Reviewed  COMPREHENSIVE METABOLIC PANEL - Abnormal; Notable for the following components:      Result Value   Sodium 128 (*)    Chloride 96 (*)    CO2 8 (*)    Glucose, Bld 434 (*)    BUN 26 (*)    Total Protein 8.4 (*)    AST 13 (*)    Alkaline Phosphatase 143 (*)    Total Bilirubin 2.4 (*)    Anion gap 24 (*)    All other components  within normal limits  BLOOD GAS, VENOUS - Abnormal; Notable for the following components:   pH, Ven 7.02 (*)    pCO2, Ven 36 (*)    Bicarbonate 9.3 (*)    Acid-base deficit 20.9 (*)    All other components within normal limits  CBC WITH DIFFERENTIAL/PLATELET - Abnormal; Notable for the following components:   RBC 6.00 (*)    Hemoglobin 19.5 (*)    HCT 59.4 (*)    Abs Immature Granulocytes 0.13 (*)    All other components within normal limits  BETA-HYDROXYBUTYRIC ACID - Abnormal; Notable for the following components:   Beta-Hydroxybutyric Acid >8.00 (*)    All other components within normal limits  CBG MONITORING, ED - Abnormal; Notable for the following components:   Glucose-Capillary 489 (*)    All other components within normal limits  CBG MONITORING, ED - Abnormal; Notable for the following components:   Glucose-Capillary 441 (*)    All other components within normal limits  CBG MONITORING, ED - Abnormal; Notable for the following components:   Glucose-Capillary 321 (*)    All other components within normal limits  CBG MONITORING, ED - Abnormal; Notable for the following components:   Glucose-Capillary 134 (*)    All other components within normal limits  LACTIC ACID, PLASMA  LACTIC ACID, PLASMA  URINE DRUG SCREEN, QUALITATIVE ([facility] ONLY)  URINALYSIS, ROUTINE W REFLEX MICROSCOPIC  BASIC METABOLIC PANEL  BASIC METABOLIC PANEL  BASIC METABOLIC PANEL  BASIC METABOLIC PANEL  CBC  BETA-HYDROXYBUTYRIC ACID  BETA-HYDROXYBUTYRIC ACID   EKG ED ECG REPORT I, Merwyn KatosEvan K Lysbeth Dicola, the attending physician, personally viewed and interpreted this ECG. Date: 12/11/2021 EKG Time: 1022 Rate: 98 Rhythm: normal sinus rhythm QRS Axis: normal Intervals: normal ST/T Wave abnormalities: normal Narrative Interpretation: no evidence of acute ischemia PROCEDURES: Critical Care performed: Yes, see critical care procedure note(s) .1-3 Lead EKG Interpretation Performed by: Merwyn KatosBradler, Ewell Benassi K,  MD Authorized by: Merwyn KatosBradler, Dahmir Epperly K, MD     Interpretation: normal     ECG rate:  103   ECG rate assessment: tachycardic     Rhythm: sinus tachycardia     Ectopy: none     Conduction: normal   CRITICAL CARE Performed by: Merwyn KatosEvan K Gergory Biello   Total critical care time: 41 minutes  Critical care time was exclusive of separately billable procedures and treating other patients.  Critical care was necessary to treat or prevent imminent or life-threatening deterioration.  Critical care was time spent personally by me on the following activities: development of treatment plan with patient and/or surrogate as well as nursing, discussions with consultants, evaluation of patient's response to treatment, examination of patient, obtaining history from patient or surrogate, ordering and performing treatments and interventions, ordering and review of laboratory studies, ordering and review of radiographic studies, pulse oximetry and re-evaluation of patient's condition.  MEDICATIONS ORDERED IN ED: Medications  lactated ringers bolus 1,080 mL (1,080 mLs Intravenous Not Given 12/11/21 1513)  insulin regular, human (MYXREDLIN) 100 units/ 100 mL infusion (1 Units/hr Intravenous Rate/Dose Change 12/11/21 1506)  lactated ringers infusion ( Intravenous Not Given 12/11/21 1514)  dextrose 5 % in lactated ringers infusion (has no administration in time range)  dextrose 50 % solution 0-50 mL (has no administration in time range)  potassium chloride 10 mEq in 100 mL IVPB (10 mEq Intravenous New Bag/Given 12/11/21 1513)  ondansetron (ZOFRAN) 8 mg in sodium chloride 0.9 % 50 mL IVPB (8 mg Intravenous Patient Refused/Not Given 12/11/21 1436)  escitalopram (LEXAPRO) tablet 10 mg (has no administration in time range)  gabapentin (NEURONTIN) capsule 300 mg (has no administration in time range)  levETIRAcetam (KEPPRA) tablet 500 mg (has no administration in time range)  multivitamin with minerals tablet 1 tablet (has no  administration in time range)  enoxaparin (LOVENOX) injection 40 mg (has no administration in time range)  potassium chloride 10 mEq in 100 mL IVPB (has no administration in time range)  sodium chloride 0.9 % bolus 1,000 mL (0 mLs Intravenous Stopped 12/11/21 1201)  LORazepam (ATIVAN) injection 0.5 mg (0.5 mg Intravenous Given 12/11/21 1210)   IMPRESSION / MDM / ASSESSMENT AND PLAN / ED COURSE  I reviewed the triage vital signs and the nursing notes.                             The patient is on the cardiac monitor to evaluate for evidence of arrhythmia and/or significant heart rate changes.  Patients presentation most consistent with hyperglycemic state WITH evidence of DKA. Given Exam, History, and Workup I have low suspicion for an emergent precipitating factor of this hyperglycemic state such as atypical MI, acute abdomen, or other serious bacterial illness. Patient is type I diabetic with nonadherence in medication regimen/adherence. pH: 7.02 Potassium: 4.9  Patient started on continuous fluid boluses of LR, insulin drip, potassium chloride supplemented as needed given potassium level at each BMP Reassessment: 1522 Mental/respiratory status improving Dispo: Admit    FINAL CLINICAL IMPRESSION(S) / ED DIAGNOSES   Final diagnoses:  Diabetic ketoacidosis without coma associated with type 1 diabetes mellitus ([place])  Cocaine abuse ([place])   Rx / DC Orders   ED Discharge Orders     None      Note:  This document was prepared using Dragon voice recognition software and may include unintentional dictation errors.   Naaman Plummer, MD 12/11/21 [phone]

## 2021-12-11 NOTE — ED Notes (Addendum)
IV access obtained but unable to draw blood work. Once lab work orders are placed lab will be called for straight stick.

## 2021-12-11 NOTE — ED Notes (Signed)
RT in the room attempting ABG, unsuccessful. Will notify provider

## 2021-12-11 NOTE — Progress Notes (Addendum)
Inpatient Diabetes Program Recommendations  AACE/ADA: New Consensus Statement on Inpatient Glycemic Control (2015)  Target Ranges:  Prepandial:   less than 140 mg/dL      Peak postprandial:   less than 180 mg/dL (1-2 hours)      Critically ill patients:  140 - 180 mg/dL    Latest Reference Range & Units 12/11/21 11:22  Sodium 135 - 145 mmol/L 128 (L)  Potassium 3.5 - 5.1 mmol/L 4.9  Chloride 98 - 111 mmol/L 96 (L)  CO2 22 - 32 mmol/L 8 (L)  Glucose 70 - 99 mg/dL 434 (H)  BUN 6 - 20 mg/dL 26 (H)  Creatinine 0.61 - 1.24 mg/dL 1.02  Calcium 8.9 - 10.3 mg/dL 8.9  Anion gap 5 - 15  24 (H)    Latest Reference Range & Units 12/11/21 11:22  Beta-Hydroxybutyric Acid 0.05 - 0.27 mmol/L >8.00 (H)    Latest Reference Range & Units 07/03/21 06:33 11/09/21 03:51  Hemoglobin A1C 4.8 - 5.6 % 10.0 (H) 9.7 (H)  (231 mg/dl)    Latest Reference Range & Units 12/11/21 10:26 12/11/21 12:14 12/11/21 13:18 12/11/21 15:04  Glucose-Capillary 70 - 99 mg/dL 489 (H) 441 (H)  IV Insulin Drip Started 321 (H) 134 (H)     Admit with: DKA/ Per mother of pt, pt has been gone for days and went on "6 day cocaine binge."  History: Type 1 Diabetes, Cocaine Abuse  Home DM Meds: Lantus 50 units QHS (NOT taking)       Humalog 0-50 units as directed       Dexcom G6 CGM  Current Orders: IV Insulin Drip   Pt counseled by the Diabetes Coordinator RN during last 3 hospital admissions Seen 07/04/2021 (admitted for DKA) Seen 11/01/2021 (admitted for DKA) 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 Baruch Gouty with [facility] Last seen 10/05/2021 Pt was instructed to take:  Lantus 45 units QHS Humalog 1 unit for every 8 grams Carbohydrates Humalog 1 unit for every 50 >150 mg/dl    --Will follow patient during hospitalization--  Wyn Quaker RN, MSN, CDE Diabetes Coordinator Inpatient Glycemic Control Team Team Pager: [phone] (8a-5p)

## 2021-12-12 ENCOUNTER — Inpatient Hospital Stay: Payer: 59

## 2021-12-12 DIAGNOSIS — F1910 Other psychoactive substance abuse, uncomplicated: Secondary | ICD-10-CM | POA: Diagnosis present

## 2021-12-12 DIAGNOSIS — E1010 Type 1 diabetes mellitus with ketoacidosis without coma: Secondary | ICD-10-CM | POA: Diagnosis not present

## 2021-12-12 LAB — GLUCOSE, CAPILLARY
Glucose-Capillary: 104 mg/dL — ABNORMAL HIGH (ref 70–99)
Glucose-Capillary: 113 mg/dL — ABNORMAL HIGH (ref 70–99)
Glucose-Capillary: 124 mg/dL — ABNORMAL HIGH (ref 70–99)
Glucose-Capillary: 124 mg/dL — ABNORMAL HIGH (ref 70–99)
Glucose-Capillary: 124 mg/dL — ABNORMAL HIGH (ref 70–99)
Glucose-Capillary: 127 mg/dL — ABNORMAL HIGH (ref 70–99)
Glucose-Capillary: 133 mg/dL — ABNORMAL HIGH (ref 70–99)
Glucose-Capillary: 158 mg/dL — ABNORMAL HIGH (ref 70–99)
Glucose-Capillary: 236 mg/dL — ABNORMAL HIGH (ref 70–99)
Glucose-Capillary: 289 mg/dL — ABNORMAL HIGH (ref 70–99)
Glucose-Capillary: 61 mg/dL — ABNORMAL LOW (ref 70–99)
Glucose-Capillary: 89 mg/dL (ref 70–99)
Glucose-Capillary: 96 mg/dL (ref 70–99)

## 2021-12-12 LAB — BASIC METABOLIC PANEL
Anion gap: 14 (ref 5–15)
BUN: 19 mg/dL (ref 6–20)
CO2: 17 mmol/L — ABNORMAL LOW (ref 22–32)
Calcium: 8.9 mg/dL (ref 8.9–10.3)
Chloride: 104 mmol/L (ref 98–111)
Creatinine, Ser: 0.7 mg/dL (ref 0.61–1.24)
GFR, Estimated: >60 mL/min (ref >60)
Glucose, Bld: 132 mg/dL — ABNORMAL HIGH (ref 70–99)
Potassium: 4.3 mmol/L (ref 3.5–5.1)
Sodium: 135 mmol/L (ref 135–145)

## 2021-12-12 LAB — BETA-HYDROXYBUTYRIC ACID
Beta-Hydroxybutyric Acid: 3.39 mmol/L — ABNORMAL HIGH (ref 0.05–0.27)
Beta-Hydroxybutyric Acid: 1.53 mmol/L — ABNORMAL HIGH (ref 0.05–0.27)

## 2021-12-12 IMAGING — DX DG TOE GREAT 2+V*R*
3 series · 3 of 3 positions shown · non-contrast
Comparison: Right toes MRI dated [DATE].

CLINICAL DATA: Right great toe pain.

EXAM:
RIGHT GREAT TOE

[toe ap]
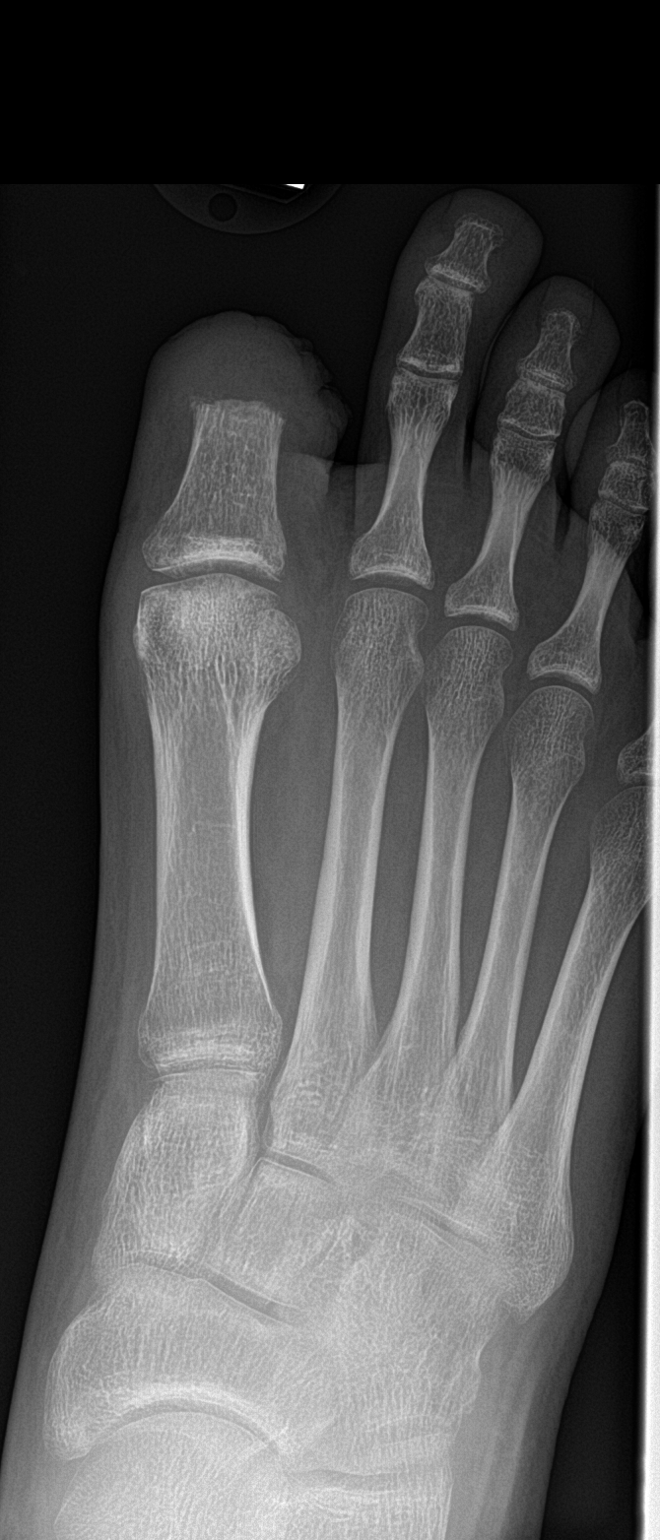

[toe obl]
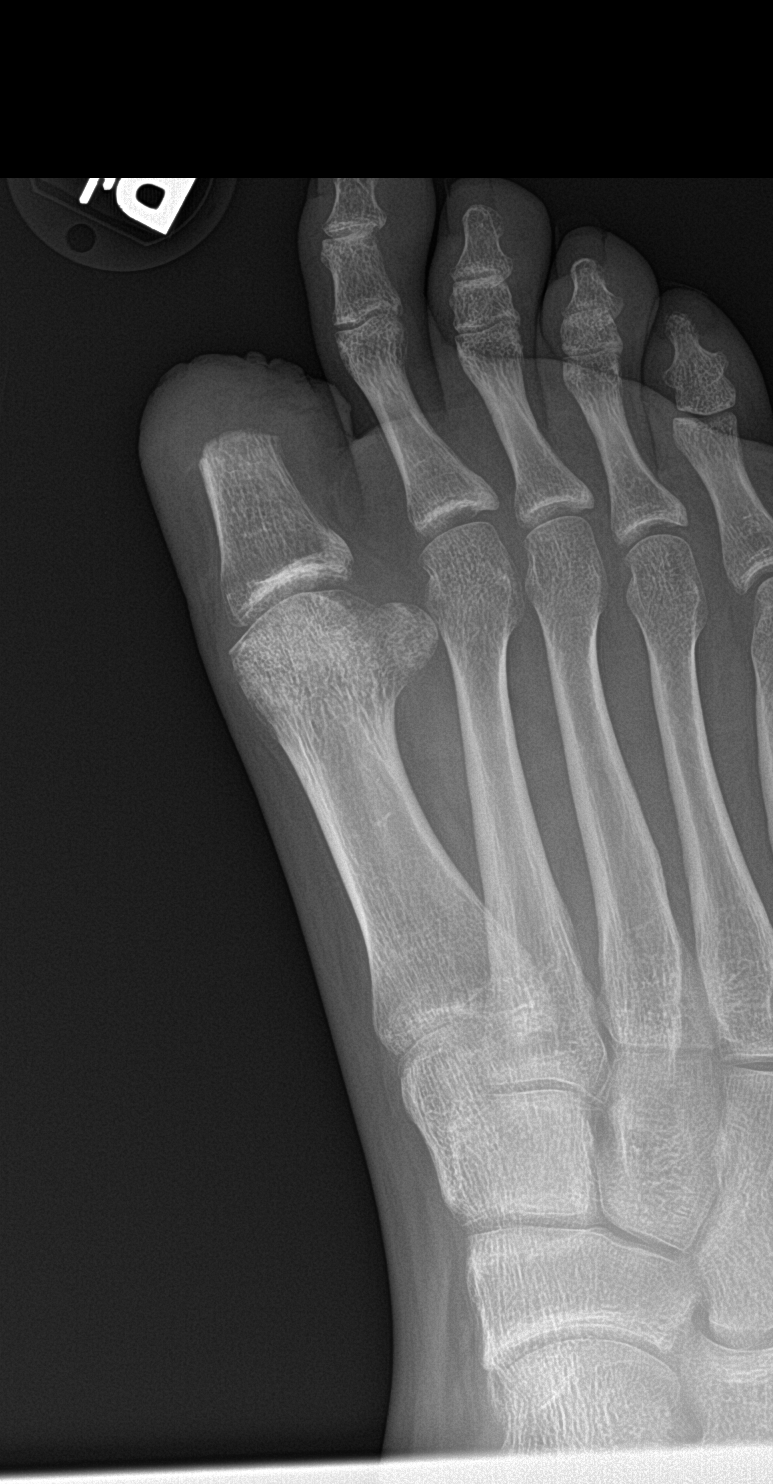

[toe lat]
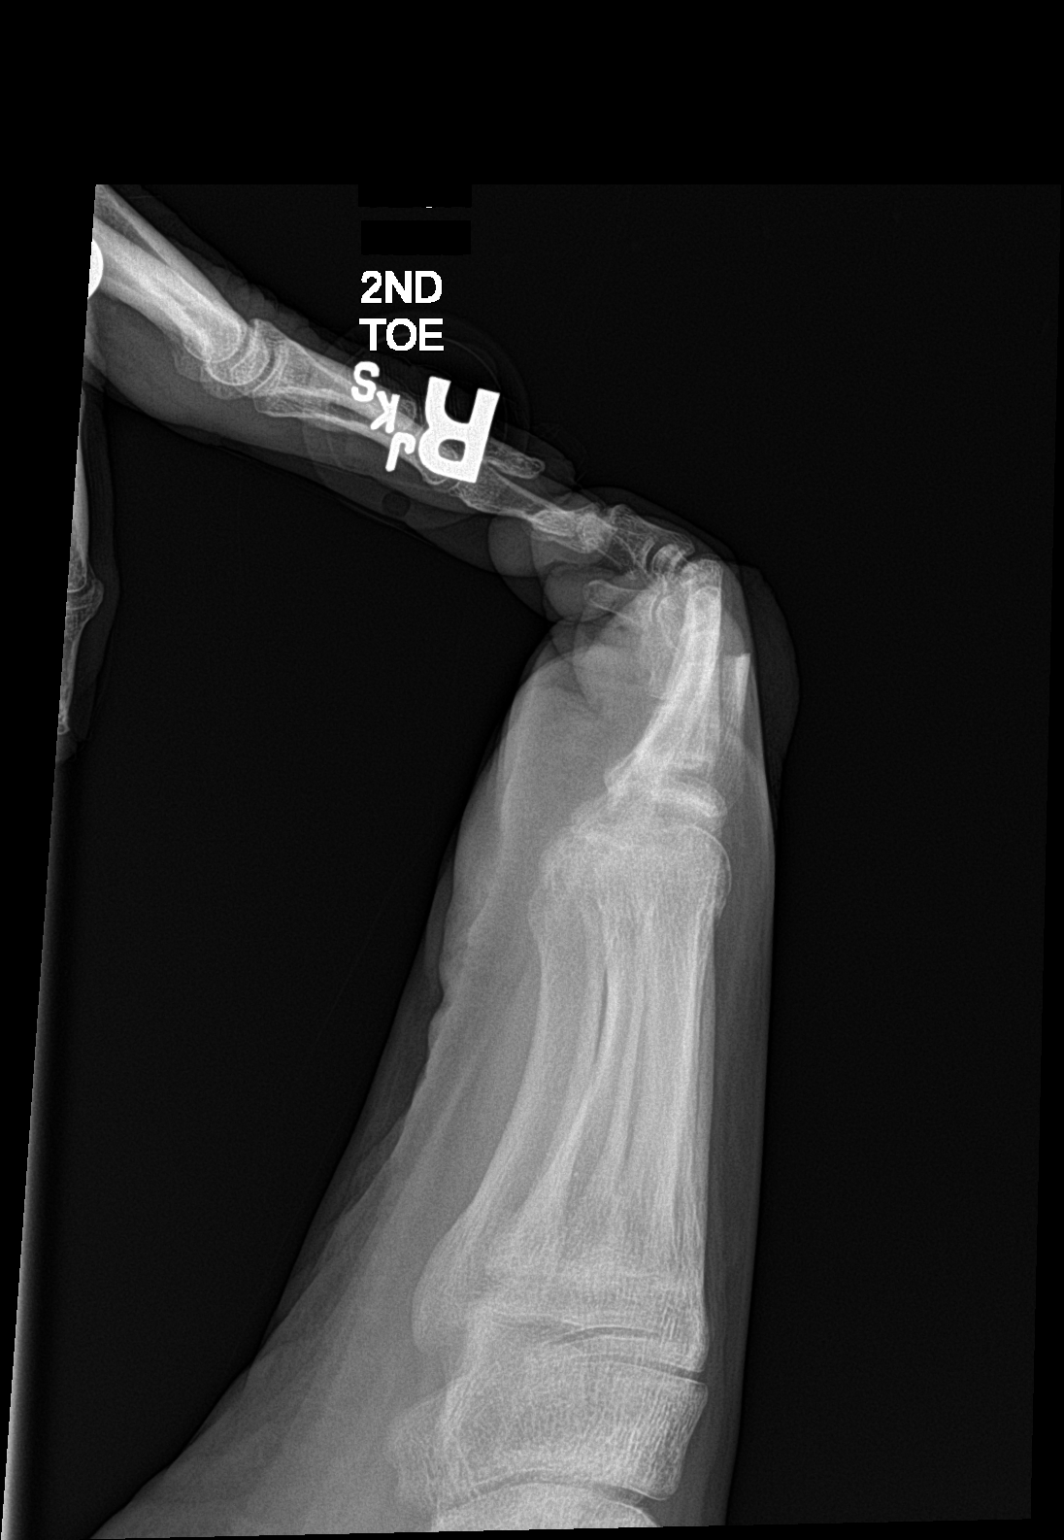

[3 of 3 positions shown; findings below may reference images not displayed]

FINDINGS: Interval amputation of the distal right great toe at the level of
the distal aspect of the 1st proximal phalanx. The medial and
lateral distal margins are mildly irregular with tiny bone
fragments. There is also a small area of loss of visible cortical
bone at the base of the 1st proximal phalanx laterally. No soft
tissue gas.
IMPRESSION: Findings concerning for possible early changes of osteomyelitis
involving the remaining portion of the 1st proximal phalanx.

## 2021-12-12 IMAGING — MR MR FOOT*R* WO/W CM
8 series · 40 of 40 positions shown · IV contrast (6ml Gadavist)
Comparison: Radiographs dated [DATE]

CLINICAL DATA: Concern for osteomyelitis.

EXAM:
MRI OF THE RIGHT FOREFOOT WITHOUT AND WITH CONTRAST
TECHNIQUE: Multiplanar, multisequence MR imaging of the right forefoot was
performed before and after the administration of intravenous
contrast.
CONTRAST:  6mL GADAVIST GADOBUTROL 1 MMOL/ML IV SOLN

[Series 4: T1 · coronal · right · 3.0mm · 0.38mm/px · 6 of 45 slices shown (1 of 2)]
[im 1/45]
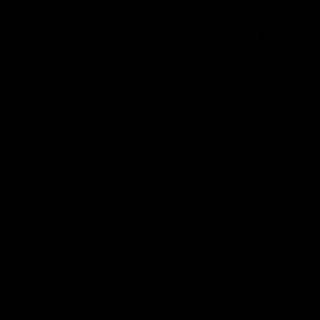
[im 9/45]
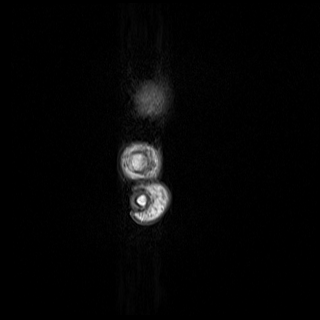
[im 18/45]
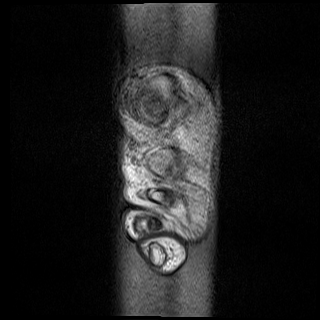
[im 27/45]
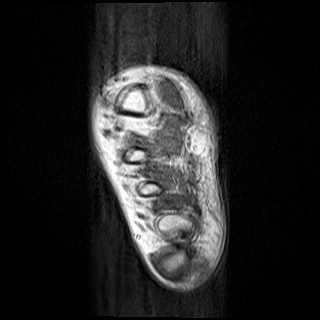
[im 36/45]
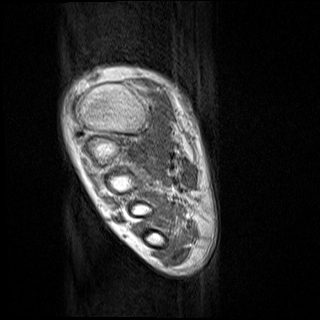
[im 45/45]
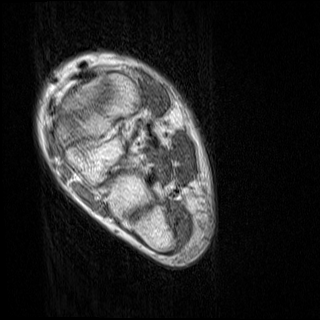

[Series 6: T2 · coronal · right · 3.0mm · 0.38mm/px · 6 of 45 slices shown (1 of 2)]
[im 1/45]
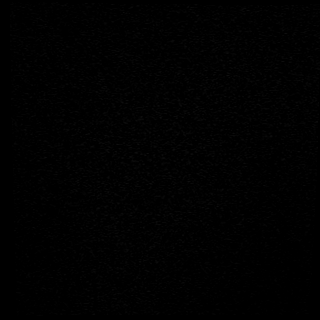
[im 9/45]
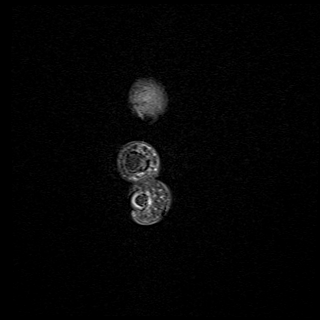
[im 18/45]
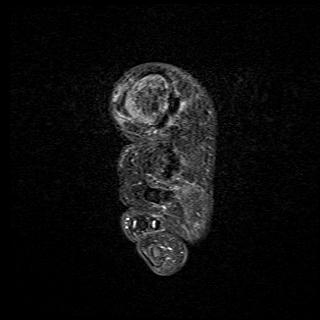
[im 27/45]
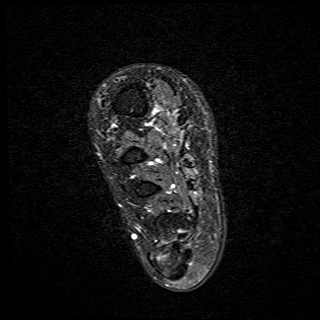
[im 36/45]
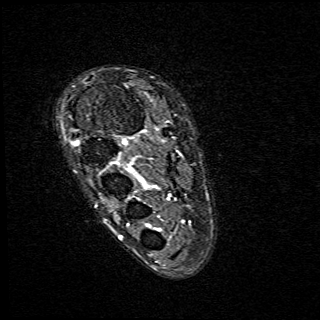
[im 45/45]
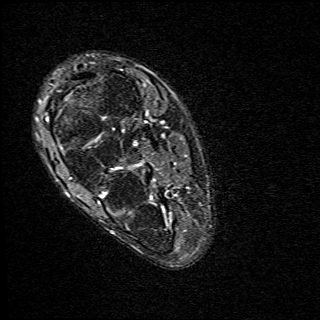

[Series 9: T1 · sagittal · right · 3.0mm · 0.70mm/px · 3 of 20 slices shown (2 of 2)]
[im 1/20]
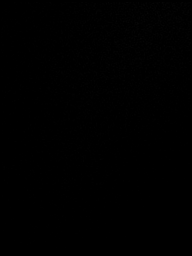
[im 10/20]
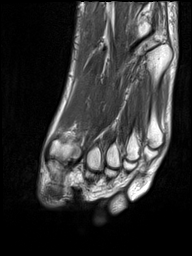
[im 20/20]
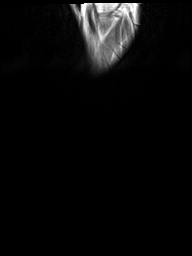

[Series 11: T2 · sagittal · right · 3.0mm · 0.70mm/px · 3 of 20 slices shown (2 of 2)]
[im 1/20]
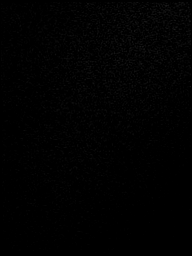
[im 10/20]
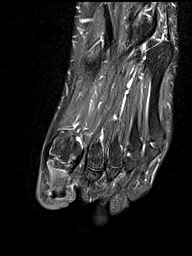
[im 20/20]
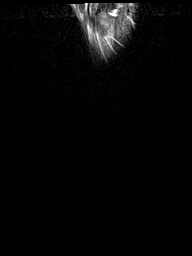

[Series 12: T1 fat-sat · coronal · non-contrast · right · 3.0mm · 0.38mm/px · 7 of 45 slices shown (1 of 3)]
[im 1/45]
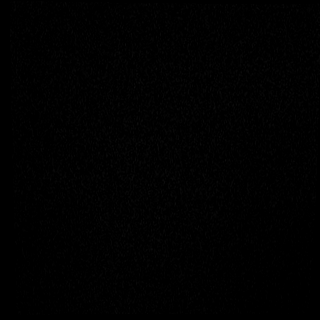
[im 8/45]
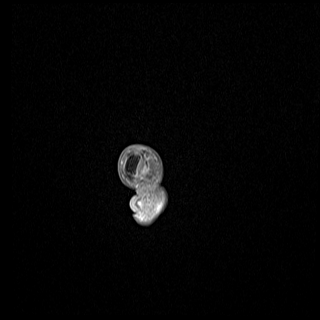
[im 15/45]
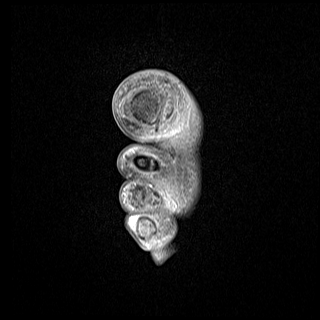
[im 23/45]
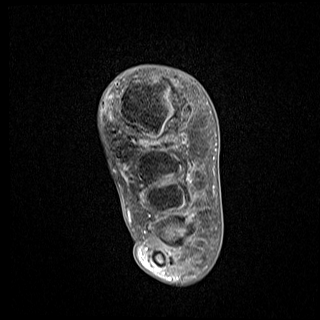
[im 30/45]
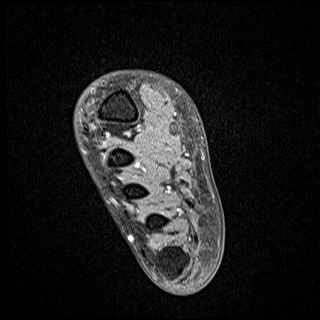
[im 37/45]
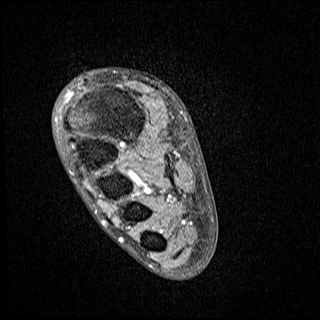
[im 45/45]
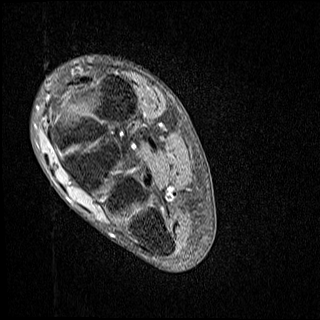

[Series 13: T1 fat-sat post-contrast · coronal · right · 3.0mm · 0.38mm/px · 7 of 45 slices shown]
[im 1/45]
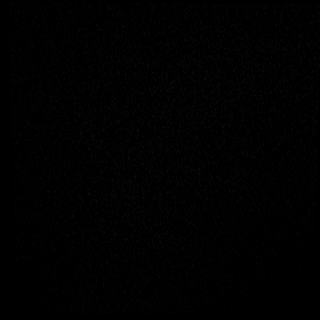
[im 8/45]
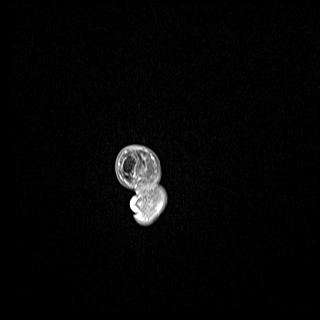
[im 15/45]
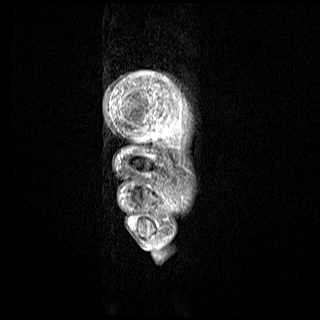
[im 23/45]
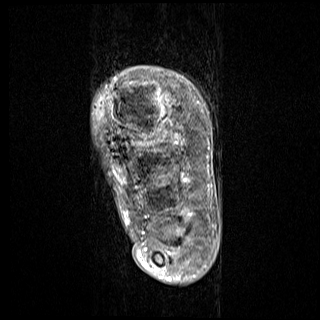
[im 30/45]
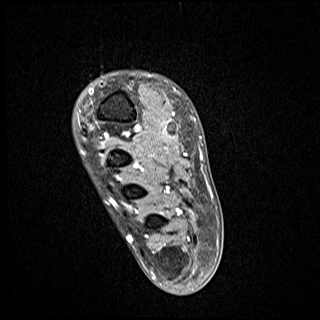
[im 37/45]
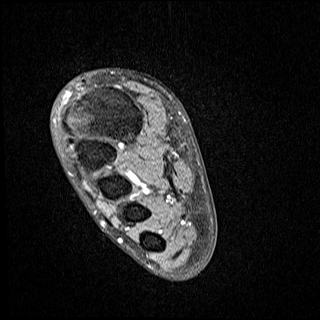
[im 45/45]
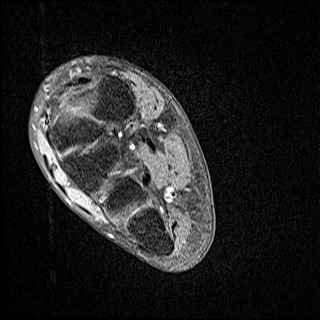

[Series 14: T1 fat-sat · oblique · right · 3.0mm · 0.62mm/px · 5 of 31 slices shown (2 of 3)]
[im 1/31]
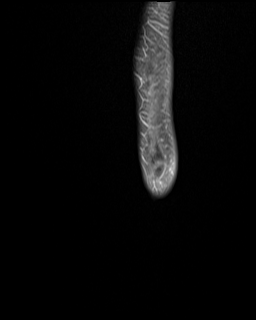
[im 8/31]
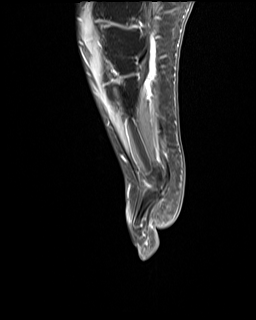
[im 16/31]
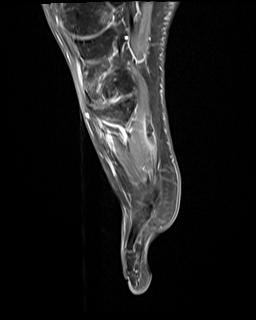
[im 23/31]
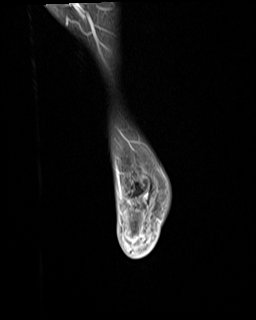
[im 31/31]
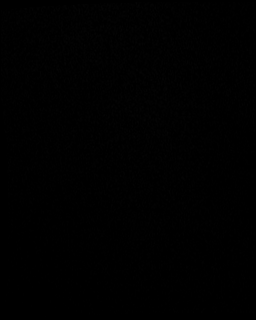

[Series 15: T1 fat-sat · sagittal · right · 3.0mm · 0.70mm/px · 3 of 19 slices shown (3 of 3)]
[im 1/19]
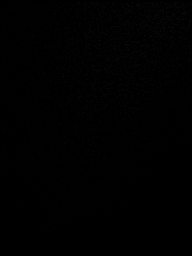
[im 10/19]
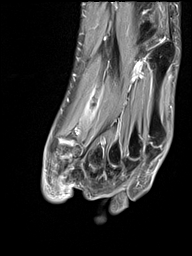
[im 19/19]
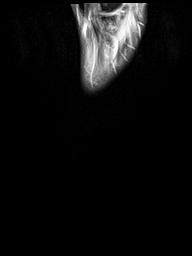

[40 of 40 positions shown; findings below may reference images not displayed]

FINDINGS: Bones/Joint/Cartilage

Status post first digit amputation through the distal aspect of the
proximal phalanx. There is bone marrow edema about the prior
amputation site. Subchondral cystic changes and edema at the first
metatarsophalangeal joint.

Ligaments

Lisfranc ligament is intact.  Collateral ligaments are maintained.

Muscles and Tendons

Muscles are normal in signal. No muscle atrophy or fluid
collection/abscess.

Soft tissues

Soft tissue swelling and edema about the prior amputation site
without evidence of drainable fluid collection or abscess.
IMPRESSION: 1. Bone marrow edema at the amputation site, likely postsurgical
changes. No definite evidence of acute osteomyelitis, if there is
nonhealing wound about the prior amputation site and clinical
concern for acute osteomyelitis short-term follow-up examination
could be considered.

2. Edema and soft tissue swelling about distal aspect of the first
digit, nonspecific finding, it may be secondary to postsurgical
changes or infectious/inflammatory process. Clinical correlation is
suggested.

## 2021-12-12 IMAGING — DX DG CHEST 1V PORT
1 series · 1 of 1 positions shown · non-contrast
Comparison: Chest x-ray [DATE].

CLINICAL DATA: 21-year-old male with history of diabetes. Right
great toe pain.

EXAM:
PORTABLE CHEST 1 VIEW

[chest ap]
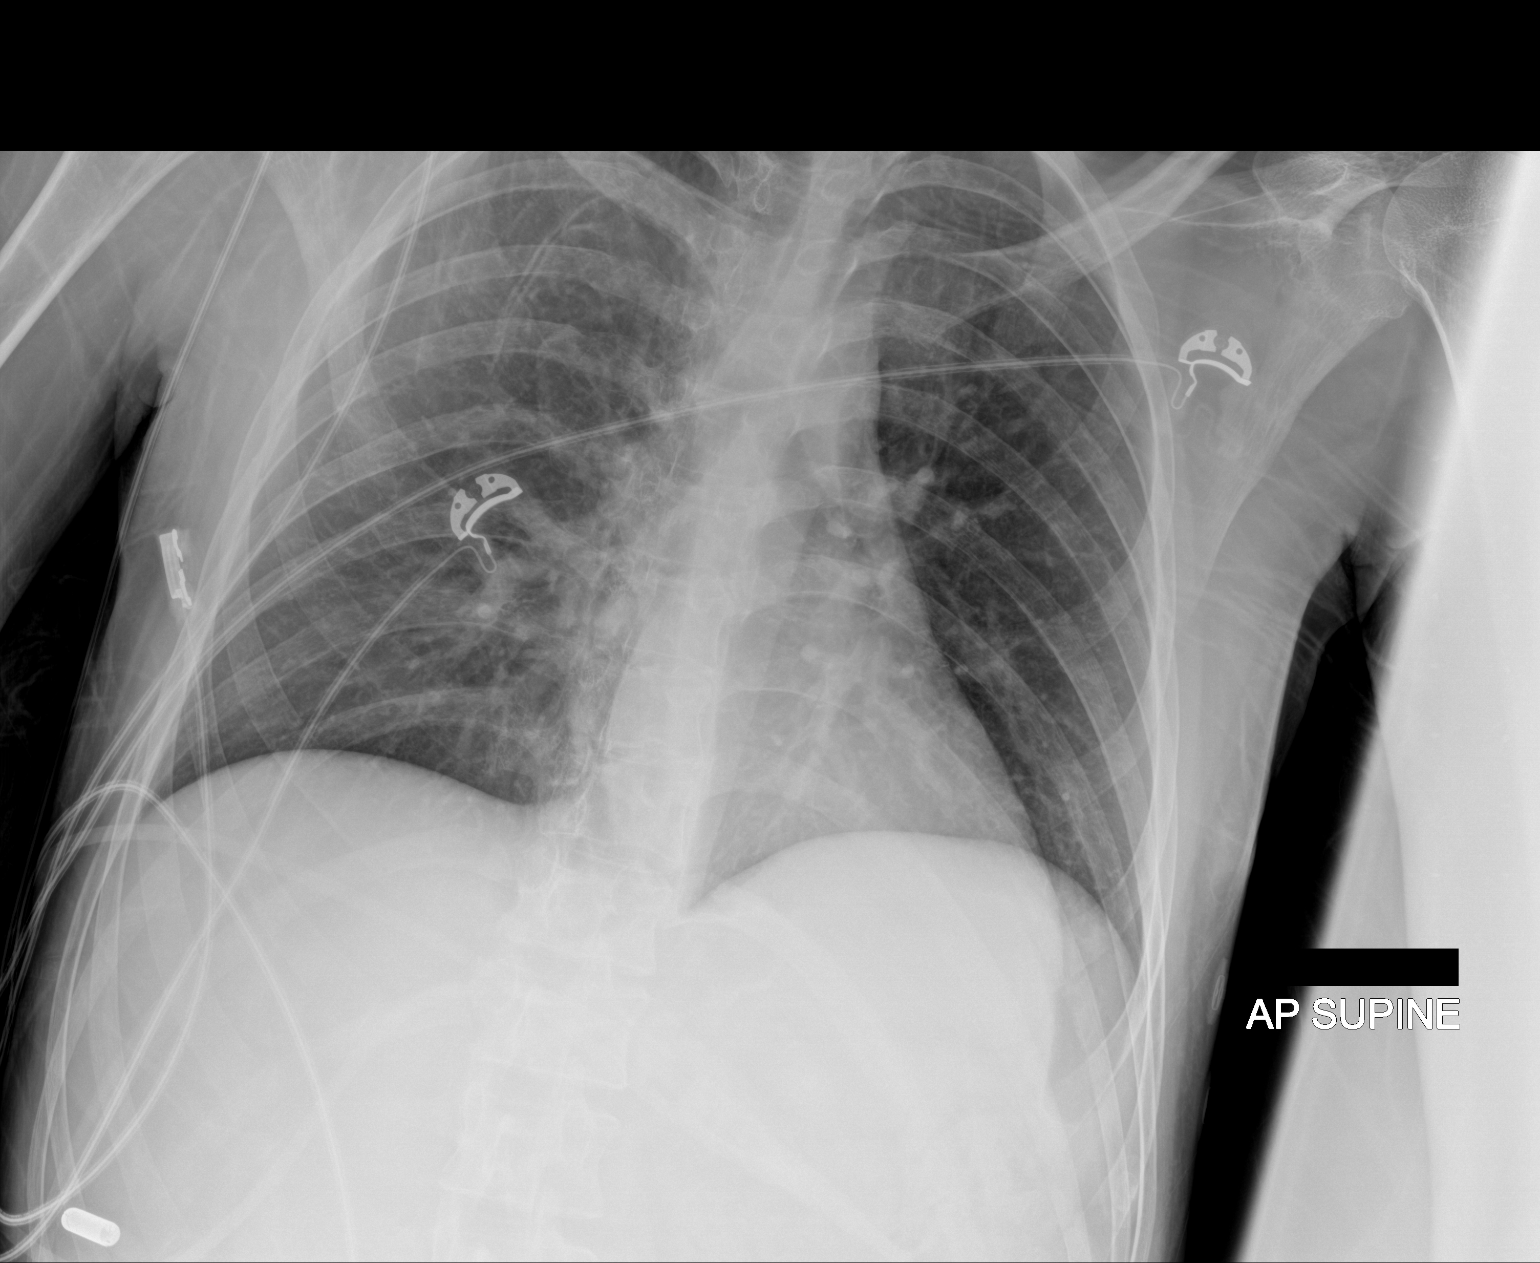

[1 of 1 positions shown; findings below may reference images not displayed]

FINDINGS: Lung volumes are normal. No consolidative airspace disease. No
pleural effusions. No pneumothorax. No pulmonary nodule or mass
noted. Pulmonary vasculature and the cardiomediastinal silhouette
are within normal limits.
IMPRESSION: No radiographic evidence of acute cardiopulmonary disease.

## 2021-12-12 MED ORDER — CHLORHEXIDINE GLUCONATE CLOTH 2 % EX PADS
6.0000 | MEDICATED_PAD | Freq: Every day | CUTANEOUS | Status: DC
  Administered 2021-12-12 – 2021-12-13 (×2): 6 via TOPICAL

## 2021-12-12 MED ORDER — INSULIN ASPART 100 UNIT/ML IJ SOLN
0.0000 [IU] | Freq: Three times a day (TID) | INTRAMUSCULAR | Status: DC
  Administered 2021-12-12: 1 [IU] via SUBCUTANEOUS
  Administered 2021-12-12 – 2021-12-13 (×3): 3 [IU] via SUBCUTANEOUS
  Filled 2021-12-12 (×4): qty 1

## 2021-12-12 MED ORDER — GADOBUTROL 1 MMOL/ML IV SOLN
6.0000 mL | Freq: Once | INTRAVENOUS | Status: AC | PRN
  Administered 2021-12-12: 6 mL via INTRAVENOUS

## 2021-12-12 MED ORDER — INSULIN ASPART 100 UNIT/ML IJ SOLN
3.0000 [IU] | Freq: Three times a day (TID) | INTRAMUSCULAR | Status: DC
Start: 2021-12-12 — End: 2021-12-13
  Administered 2021-12-12 – 2021-12-13 (×5): 3 [IU] via SUBCUTANEOUS
  Filled 2021-12-12 (×5): qty 1

## 2021-12-12 MED ORDER — INSULIN ASPART 100 UNIT/ML IJ SOLN
0.0000 [IU] | Freq: Every day | INTRAMUSCULAR | Status: DC
  Administered 2021-12-12: 3 [IU] via SUBCUTANEOUS
  Filled 2021-12-12: qty 1

## 2021-12-12 MED ORDER — TRAZODONE HCL 50 MG PO TABS
150.0000 mg | ORAL_TABLET | Freq: Once | ORAL | Status: AC
  Administered 2021-12-13: 150 mg via ORAL
  Filled 2021-12-12: qty 1

## 2021-12-12 MED ORDER — LACTATED RINGERS IV SOLN: INTRAVENOUS | Status: DC

## 2021-12-12 MED ORDER — INSULIN DETEMIR 100 UNIT/ML ~~LOC~~ SOLN
50.0000 [IU] | SUBCUTANEOUS | Status: DC
  Administered 2021-12-12 – 2021-12-13 (×2): 50 [IU] via SUBCUTANEOUS
  Filled 2021-12-12 (×2): qty 0.5

## 2021-12-12 NOTE — Progress Notes (Addendum)
PROGRESS NOTE    Jesus Ewing  [id number] DOB: [birth year]  DOA: 12/11/2021 Date of Service: 12/12/21 PCP: System, Provider Not In     Brief Narrative / Hospital Course:  Jesus Ewing is a 21 y.o. male with medical history significant of DM type I, celiac disease, amphetamine abuse presents to ed via EMS due to persistent n/v at home. Patient has not been home for days and returns home after 6 days cocaine binge per parent. Patient notes he has not taken any of his insulin during that time. Early admission / in ED, per notes, pt notes depression but no SI. Patient at times is willing to participate in care and at others refuses care but eventually is able to be convinced to get labs draws and iv placed.  He also has great toe partial amputation with removal of sutures 2 weeks ago. He notes some pain but otherwise no complaints. He intermittently give history to difficult to get full ros. To setepdown and treated for DKA which improved. Off insulin drip as of AM 05/29 Seen on 05/29, SDU --> MedSurg. XR concerning for possible OM, will get MRI. Pt not participating in interview. Mom at bedside. Pt became agitated stating he would rather die.   Consultants:  Behavioral Health  Procedures: none    Subjective: Patient  not participating in interview other than "I feel fine and I want to get the [expletive] out of here"     ASSESSMENT & PLAN:   Principal Problem:   DKA (diabetic ketoacidosis) ([place]) Active Problems:   Celiac disease   Depression   Diabetic osteomyelitis ([place])   DKA (diabetic ketoacidosis) ([place]) Type1 DM with DKA due to noncompliance with medication  off insulin gtt at this point repeat abg imroved ivfs per protocol  strict I/o  restarted diet and SSI   Depression Voicing self-harm thoughts today Behavioral health consult - secure chat read by [facility] team - asked to eval for IVC once medically stable   Diabetic osteomyelitis  ([place]) Recent amputation, XR now concerning for OM of remaining bone  MRI pending may need to reconsult podiatry   Celiac disease Gluten free diet  Substance abuse ([place]) Mom reports "on a bender" all last week, not on medications over that time  Abstinence stressed to patient [facility] consult pending  Mother is interested in rehab resources, will see if [facility] can provide anything or if he meets criteria for IVC           DVT prophylaxis: lovenox Code Status: full Family Communication: mother at bedside  Disposition Plan: patient  expected to be admitted greater than 2 midnights  Consults called: [place], see order Admission status: inpatient              Objective: Vitals:   12/12/21 0800 12/12/21 0900 12/12/21 1000 12/12/21 1100  BP: 110/67 (!) 109/59 (!) 106/58 104/62  Pulse: 73     Resp: 17 18 16 18   Temp:      TempSrc:      SpO2: 100%     Weight:      Height:        Intake/Output Summary (Last 24 hours) at 12/12/2021 1210 Last data filed at 12/12/2021 1100 Gross per 24 hour  Intake 2117.33 ml  Output --  Net 2117.33 ml   Filed Weights   12/11/21 1019 12/11/21 2331  Weight: 54 kg 57.3 kg    Examination:  Constitutional:  VS as above General Appearance: alert, Ears, Nose,  Mouth, Throat: Normal appearance Neck: No masses, trachea midline Respiratory: Normal respiratory effort Breath sounds normal, no wheeze/rhonchi/rales Cardiovascular: S1/S2 normal, no murmur/rub/gallop auscultated No lower extremity edema Gastrointestinal: Nontender, no masses Musculoskeletal:  No clubbing/cyanosis of digits Neurological: No cranial nerve deficit on limited exam Motor and sensation intact and symmetric Psychiatric: Poor judgment/insight Depressed alternative with agitated mood and affect       Scheduled Medications:   Chlorhexidine Gluconate Cloth  6 each Topical Daily   enoxaparin (LOVENOX) injection  40 mg Subcutaneous Q24H   escitalopram  10 mg Oral  Daily   gabapentin  300 mg Oral BID   insulin aspart  0-5 Units Subcutaneous QHS   insulin aspart  0-9 Units Subcutaneous TID WC   insulin aspart  3 Units Subcutaneous TID WC   insulin detemir  50 Units Subcutaneous Q24H   levETIRAcetam  500 mg Oral BID   multivitamin with minerals  1 tablet Oral Daily   traZODone  150 mg Oral Once    Continuous Infusions:  lactated ringers     lactated ringers 125 mL/hr at 12/12/21 0600   ondansetron (ZOFRAN) IV      PRN Medications:  dextrose, phenol  Antimicrobials:  Anti-infectives (From admission, onward)    None       Data Reviewed: I have personally reviewed following labs and imaging studies  CBC: Recent Labs  Lab 12/11/21 1122 12/11/21 1834  WBC 8.4 8.2  NEUTROABS 4.5  --   HGB 19.5* 17.8*  HCT 59.4* 53.0*  MCV 99.0 97.2  PLT 376 0000000   Basic Metabolic Panel: Recent Labs  Lab 12/11/21 1122 12/11/21 1834 12/12/21 0327  NA 128* 133* 135  K 4.9 4.7 4.3  CL 96* 100 104  CO2 8* 15* 17*  GLUCOSE 434* 212* 132*  BUN 26* 21* 19  CREATININE 1.02 0.92 0.70  CALCIUM 8.9 8.9 8.9   GFR: Estimated Creatinine Clearance: 116.4 mL/min (by C-G formula based on SCr of 0.7 mg/dL). Liver Function Tests: Recent Labs  Lab 12/11/21 1122  AST 13*  ALT 35  ALKPHOS 143*  BILITOT 2.4*  PROT 8.4*  ALBUMIN 4.5   No results for input(s): LIPASE, AMYLASE in the last 168 hours. No results for input(s): AMMONIA in the last 168 hours. Coagulation Profile: No results for input(s): INR, PROTIME in the last 168 hours. Cardiac Enzymes: No results for input(s): CKTOTAL, CKMB, CKMBINDEX, TROPONINI in the last 168 hours. BNP (last 3 results) No results for input(s): PROBNP in the last 8760 hours. HbA1C: No results for input(s): HGBA1C in the last 72 hours. CBG: Recent Labs  Lab 12/12/21 0639 12/12/21 0713 12/12/21 0750 12/12/21 1025 12/12/21 1154  GLUCAP 127* 89 61* 124* 133*   Lipid Profile: No results for input(s): CHOL,  HDL, LDLCALC, TRIG, CHOLHDL, LDLDIRECT in the last 72 hours. Thyroid Function Tests: No results for input(s): TSH, T4TOTAL, FREET4, T3FREE, THYROIDAB in the last 72 hours. Anemia Panel: No results for input(s): VITAMINB12, FOLATE, FERRITIN, TIBC, IRON, RETICCTPCT in the last 72 hours. Urine analysis:    Component Value Date/Time   COLORURINE STRAW (A) 12/11/2021 1510   APPEARANCEUR CLEAR (A) 12/11/2021 1510   LABSPEC 1.025 12/11/2021 1510   PHURINE 5.0 12/11/2021 1510   GLUCOSEU >=500 (A) 12/11/2021 1510   HGBUR NEGATIVE 12/11/2021 1510   BILIRUBINUR NEGATIVE 12/11/2021 1510   KETONESUR 80 (A) 12/11/2021 1510   PROTEINUR 30 (A) 12/11/2021 1510   NITRITE NEGATIVE 12/11/2021 1510   LEUKOCYTESUR NEGATIVE 12/11/2021 1510  Sepsis Labs: @LABRCNTIP (procalcitonin:4,lacticidven:4)  No results found for this or any previous visit (from the past 240 hour(s)).       Radiology Studies last 96 hours: DG Chest Port 1 View  Result Date: 12/12/2021 CLINICAL DATA:  21 year old male with history of diabetes. Right great toe pain. EXAM: PORTABLE CHEST 1 VIEW COMPARISON:  Chest x-ray 10/31/2021. FINDINGS: Lung volumes are normal. No consolidative airspace disease. No pleural effusions. No pneumothorax. No pulmonary nodule or mass noted. Pulmonary vasculature and the cardiomediastinal silhouette are within normal limits. IMPRESSION: No radiographic evidence of acute cardiopulmonary disease. Electronically Signed   By: Vinnie Langton M.D.   On: 12/12/2021 08:06   DG Toe Great Right  Result Date: 12/12/2021 CLINICAL DATA:  Right great toe pain. EXAM: RIGHT GREAT TOE COMPARISON:  Right toes MRI dated 11/08/2021. FINDINGS: Interval amputation of the distal right great toe at the level of the distal aspect of the 1st proximal phalanx. The medial and lateral distal margins are mildly irregular with tiny bone fragments. There is also a small area of loss of visible cortical bone at the base of the 1st  proximal phalanx laterally. No soft tissue gas. IMPRESSION: Findings concerning for possible early changes of osteomyelitis involving the remaining portion of the 1st proximal phalanx. Electronically Signed   By: Claudie Revering M.D.   On: 12/12/2021 09:17            LOS: 1 day    Time spent: 35 minutes    Emeterio Reeve, DO [facility] 12/12/2021, 12:10 PM   Staff may message me via secure chat in [place]  but this may not receive immediate response,  please page for urgent matters!  If 7PM-7AM, please contact night-coverage [email or web address]  Dictation software was used to generate the above note. Typos may occur and escape review, as with typed/written notes. Please contact Dr Sheppard Coil directly for clarity if needed.

## 2021-12-12 NOTE — Progress Notes (Signed)
Attempted to assist to obtain chest and foot X-ray. Patient started being cooperative and allowed the board to be placed behind his back, then he refused to put his arms down until he was allowed to eat. Explained to him again that he could have sips of water with medications, but he was not able to eat yet. He stated that he would just go home then.  NP is aware of multiple statements of leaving AMA. Paperwork is at bedside. Board was removed from behind him. Patient turned over and went back to sleep.

## 2021-12-12 NOTE — Assessment & Plan Note (Signed)
Gluten free diet 

## 2021-12-12 NOTE — Progress Notes (Signed)
Patient here from the ER on the stretcher. Insulin gtt infusing at 5 units. Moved patient over to bed, explaining to the patient what we were doing and what had to be done. When attempting to remove his shirt and complete his CHG bath, he became aggressive and started to yell. Multiple nurses at bedside assisting. He was again told what we were doing. He refused to put a gown on, we were able to place him on the monitor and obtain vitals. Shirt was placed in his belongings bag that came up with the patient. He began to yell again that he refused everything and wanted to go home. He was told that he could call someone to pick him up and was free to leave against medical advice. He stated that he just wanted to eat.  I explained to him that he was currently unable to eat or drink because first of all, there was no order, and secondly, he was still in DKA. He did not understand this because his CBG was lower than previously. He was informed that he is still on an insulin drip that is working to correct his glucose and other lab values. He turned over and asked for the lights to be turned out. Call bell was given to him, bed alarm turned on, and lights turned out.

## 2021-12-12 NOTE — Assessment & Plan Note (Signed)
Type1 DM with DKA due to noncompliance with medication Discussed importance of strict adherence

## 2021-12-12 NOTE — Progress Notes (Signed)
       CROSS COVER NOTE  NAME: Jesus Ewing MRN: [id number] DOB : 03/10/2001   Notified by nursing they received ENDOTOOL alert that patient is to be transitioned off of insulin infusion.  Chart reviewed.  Gap is closed and beta-hydroxybutyrate level normalizing.    Will place on home basal/bolus insulin therapy.  This will include 50U of basal insulin daily and 3U units before each meal.    Diabetic diet additionally started.  Insulin infusion to be stopped 2 hrs after administration of basal insulin administration.     Initiating accuchecks QAC and QHS with sliding scale insulin.  Bishop Limbo DNP, MHA, FNP-BC Nurse Practitioner [facility] Pager [phone]

## 2021-12-12 NOTE — Progress Notes (Signed)
Inpatient Diabetes Program Recommendations  AACE/ADA: New Consensus Statement on Inpatient Glycemic Control (2015)  Target Ranges:  Prepandial:   less than 140 mg/dL      Peak postprandial:   less than 180 mg/dL (1-2 hours)      Critically ill patients:  140 - 180 mg/dL    Latest Reference Range & Units 12/11/21 23:33 12/12/21 00:30 12/12/21 01:57 12/12/21 03:05 12/12/21 03:58 12/12/21 05:22 12/12/21 06:39 12/12/21 07:13 12/12/21 07:50  Glucose-Capillary 70 - 99 mg/dL 824 (H)  IV Insulin Drip Infusing 158 (H) 104 (H) 124 (H) 113 (H) 124 (H)  50 units Levemir @0544   IV Insulin Drip Stopped 127 (H) 89 61 (L)    Admit with: DKA/ Per mother of pt, pt has been gone for days and went on "6 day cocaine binge."   History: Type 1 Diabetes, Cocaine Abuse   Home DM Meds: Lantus 50 units QHS (NOT taking)                             Humalog 0-50 units as directed                             Dexcom G6 CGM   Current Orders: Levemir 50 units Q24 hours      Novolog Sensitive Correction Scale/ SSI (0-9 units) TID AC + HS      Novolog 3 units TID with meals       Transitioned to SQ Insulin early this AM  Most recent A1c was 9.7% (April 2023)  MD- Note Hypoglycemia this AM after receiving home dose basal insulin  May consider reducing Levemir slightly to 45 units Q24 hours  Has Endocrinologist, however, if pt continues to use Cocaine, he will continue to have difficulty managing his diabetes     Pt counseled by the Diabetes Coordinator RN during last 3 hospital admissions Seen 07/04/2021 (admitted for DKA) Seen 11/01/2021 (admitted for DKA) Seen 11/10/2021 (admitted for Diabetic osteomyelitis)     Endocrinologist: Dr. 11/12/2021 with [facility] Last seen 10/05/2021 Pt was instructed to take:  Lantus 45 units QHS Humalog 1 unit for every 8 grams Carbohydrates     --Will follow patient during hospitalization--  10/07/2021 RN, MSN, CDE Diabetes  Coordinator Inpatient Glycemic Control Team Team Pager: [phone] (8a-5p)

## 2021-12-12 NOTE — Assessment & Plan Note (Signed)
   Voicing self-harm thoughts today  Behavioral health consult - secure chat read by [facility] team - asked to eval for IVC once medically stable

## 2021-12-12 NOTE — Assessment & Plan Note (Signed)
Mom reports "on a bender" all last week, not on medications over that time   Abstinence stressed to patient  [facility] consult pending   Mother is interested in rehab resources, will see if [facility] can provide anything or if he meets criteria for IVC

## 2021-12-12 NOTE — Assessment & Plan Note (Signed)
Recent amputation, XR now concerning for OM of remaining bone but MRI was clear FOllow w/ podiatry as directed

## 2021-12-13 DIAGNOSIS — F1414 Cocaine abuse with cocaine-induced mood disorder: Secondary | ICD-10-CM | POA: Diagnosis present

## 2021-12-13 LAB — BLOOD GAS, VENOUS
Acid-base deficit: 11.1 mmol/L — ABNORMAL HIGH (ref 0.0–2.0)
Bicarbonate: 13.8 mmol/L — ABNORMAL LOW (ref 20.0–28.0)
O2 Saturation: 93.9 %
Patient temperature: 37
pCO2, Ven: 28 mmHg — ABNORMAL LOW (ref 44–60)
pH, Ven: 7.3 (ref 7.25–7.43)
pO2, Ven: 66 mmHg — ABNORMAL HIGH (ref 32–45)

## 2021-12-13 LAB — BASIC METABOLIC PANEL
Anion gap: 8 (ref 5–15)
BUN: 19 mg/dL (ref 6–20)
CO2: 28 mmol/L (ref 22–32)
Calcium: 8 mg/dL — ABNORMAL LOW (ref 8.9–10.3)
Chloride: 105 mmol/L (ref 98–111)
Creatinine, Ser: 0.47 mg/dL — ABNORMAL LOW (ref 0.61–1.24)
GFR, Estimated: >60 mL/min (ref >60)
Glucose, Bld: 96 mg/dL (ref 70–99)
Potassium: 3.1 mmol/L — ABNORMAL LOW (ref 3.5–5.1)
Sodium: 141 mmol/L (ref 135–145)

## 2021-12-13 LAB — CBC
HCT: 35.6 % — ABNORMAL LOW (ref 39.0–52.0)
Hemoglobin: 12.6 g/dL — ABNORMAL LOW (ref 13.0–17.0)
MCH: 33.2 pg (ref 26.0–34.0)
MCHC: 35.4 g/dL (ref 30.0–36.0)
MCV: 93.7 fL (ref 80.0–100.0)
Platelets: 227 10*3/uL (ref 150–400)
RBC: 3.8 MIL/uL — ABNORMAL LOW (ref 4.22–5.81)
RDW: 11.9 % (ref 11.5–15.5)
WBC: 4.4 10*3/uL (ref 4.0–10.5)
nRBC: 0 % (ref 0.0–0.2)

## 2021-12-13 LAB — POTASSIUM: Potassium: 4.6 mmol/L (ref 3.5–5.1)

## 2021-12-13 LAB — GLUCOSE, CAPILLARY
Glucose-Capillary: 117 mg/dL — ABNORMAL HIGH (ref 70–99)
Glucose-Capillary: 224 mg/dL — ABNORMAL HIGH (ref 70–99)
Glucose-Capillary: 243 mg/dL — ABNORMAL HIGH (ref 70–99)

## 2021-12-13 LAB — BETA-HYDROXYBUTYRIC ACID: Beta-Hydroxybutyric Acid: 0.15 mmol/L (ref 0.05–0.27)

## 2021-12-13 MED ORDER — NALTREXONE HCL 50 MG PO TABS
25.0000 mg | ORAL_TABLET | Freq: Once | ORAL | Status: AC
  Administered 2021-12-13: 25 mg via ORAL
  Filled 2021-12-13: qty 1

## 2021-12-13 MED ORDER — NALTREXONE HCL 50 MG PO TABS: 50.0000 mg | ORAL_TABLET | Freq: Every day | ORAL | Status: DC

## 2021-12-13 MED ORDER — NALTREXONE HCL 50 MG PO TABS: 50.0000 mg | ORAL_TABLET | Freq: Every day | ORAL | 0 refills | Status: DC

## 2021-12-13 MED ORDER — POTASSIUM CHLORIDE 10 MEQ/100ML IV SOLN
10.0000 meq | INTRAVENOUS | Status: AC
  Administered 2021-12-13 (×3): 10 meq via INTRAVENOUS
  Filled 2021-12-13 (×3): qty 100

## 2021-12-13 NOTE — Discharge Summary (Signed)
Physician Discharge Summary   Patient: Jesus Ewing MRN: [id number]  DOB: 05-01-2001   Admit:     Date of Admission: 12/11/2021 Admitted from: home   Discharge: Date of discharge: 12/13/21 Disposition: Home Condition at discharge: good  CODE STATUS: FULL   Diet recommendation: Carb modified diet   Discharge Physician: Sunnie Nielsen, DO [facility]     PCP: System, Provider Not In  Recommendations for Outpatient Follow-up:   Printed for patient on AVS:   "Follow up with PCP or endocrinologist in 1 week to monitor blood levels (sugars, potassium) and adjust insulin as needed. Please take your insulin and other medications as directed.   Please follow up with podiatry to check the amputation site as directed. Thankfully, based on MRI, there does not appear to be osteomyelitis (bone infection), just bone swelling from surgery. However if sugars remain uncontrolled you are at risk for another infection and further amputation.   You have been started on Naltrexone to help with drug/alcohol cravings. Please abstain from alcohol or recreational drug use. Please follow up as directed with psychiatry."     Brief Narrative / Hospital Course:  Jesus Ewing is a 21 y.o. male with medical history significant of DM type I, celiac disease, amphetamine abuse presents to ed via EMS due to persistent n/v at home. Patient has not been home for days and returns home after 6 days cocaine binge per parent. Patient notes he has not taken any of his insulin during that time. Early admission / in ED, per notes, pt notes depression but no SI. Patient at times is willing to participate in care and at others refuses care but eventually is able to be convinced to get labs draws and iv placed.  He also has great toe partial amputation with removal of sutures 2 weeks ago. He notes some pain but otherwise no complaints. He intermittently give history to difficult to get full  ros. To setepdown and treated for DKA which improved. Off insulin drip as of AM 05/29 Seen on 05/29, SDU --> MedSurg. XR concerning for possible OM, will get MRI. Pt not participating in interview. Mom at bedside. Pt became agitated stating he would rather die. --> BH consult placed. MRI no osteomyelitis 05/30: hypokalemia repleted. Psychiatry cleared for d/c.    Consultants:  Behavioral Health   Procedures: none          Discharge Diagnoses: Principal Problem:   DKA (diabetic ketoacidosis) (HCC) Active Problems:   Cocaine abuse with cocaine-induced mood disorder (HCC)   Celiac disease   Depression   Diabetic osteomyelitis (HCC)   Substance abuse (HCC)    Assessment & Plan: DKA (diabetic ketoacidosis) (HCC) Type1 DM with DKA due to noncompliance with medication Discussed importance of strict adherence   Depression Psychiatry cleared for discharge w/ outpatient f/u  Diabetic osteomyelitis (HCC) Recent amputation, XR now concerning for OM of remaining bone but MRI was clear FOllow w/ podiatry as directed   Celiac disease Gluten free diet  Substance abuse (HCC) Mom reports "on a bender" all last week, not on medications over that time  D/c on naltrexone for EtOH dependence  Psychiatry cleared for discharge w/ outpatient f/u      Discharge Instructions  Discharge Instructions     Diet - low sodium heart healthy   Complete by: As directed    Discharge instructions   Complete by: As directed    Follow up with PCP or endocrinologist in 1 week  to monitor blood levels (sugars, potassium) and adjust insulin as needed. Please take your insulin and other medications as directed.   Please follow up with podiatry to check the amputation site as directed. Thankfully, based on MRI, there does not appear to be osteomyelitis (bone infection), just bone swelling from surgery. However if sugars remain uncontrolled you are at risk for another infection and further amputation.    You have been started on Naltrexone to help with drug/alcohol cravings. Please abstain from alcohol or recreational drug use. Please follow up as directed with psychiatry.   Discharge wound care:   Complete by: As directed    Keep surgical site clean and dry   Increase activity slowly   Complete by: As directed        Allergies as of 12/13/2021   No Known Allergies      Medication List     STOP taking these medications    oxyCODONE-acetaminophen 5-325 MG tablet Commonly known as: Percocet       TAKE these medications    escitalopram 10 MG tablet Commonly known as: LEXAPRO Take 10 mg by mouth daily.   gabapentin 300 MG capsule Commonly known as: NEURONTIN Take 1 capsule (300 mg total) by mouth 2 (two) times daily.   insulin lispro 100 UNIT/ML KwikPen Commonly known as: HUMALOG Inject 0-50 Units into the skin as directed.   Lantus SoloStar 100 UNIT/ML Solostar Pen Generic drug: insulin glargine Inject 50 Units into the skin at bedtime.   levETIRAcetam 500 MG tablet Commonly known as: KEPPRA Take 1 tablet (500 mg total) by mouth 2 (two) times daily.   multivitamin with minerals Tabs tablet Take 1 tablet by mouth daily.   naltrexone 50 MG tablet Commonly known as: DEPADE Take 1 tablet (50 mg total) by mouth daily. Start taking on: Dec 14, 2021 Notes to patient: Start 12/14/21               Discharge Care Instructions  (From admission, onward)           Start     Ordered   12/13/21 0000  Discharge wound care:       Comments: Keep surgical site clean and dry   12/13/21 1528            Diet Orders (From admission, onward)     Start     Ordered   12/13/21 0000  Diet - low sodium heart healthy        12/13/21 1528   12/12/21 0504  Diet Carb Modified Fluid consistency: Thin; Room service appropriate? Yes  Diet effective now       Question Answer Comment  Diet-HS Snack? Nothing   Calorie Level Medium 1600-2000   Fluid consistency:  Thin   Room service appropriate? Yes      12/12/21 0504              Follow-up Information     [facility] Schedule an appointment as soon as possible for a visit.   Contact information: [address] [place] [address] [phone]                 No Known Allergies   Subjective: pt feels well and ready for discharge today, no complaints    Discharge Exam: Vitals:   12/13/21 0438 12/13/21 0731  BP: (!) 108/49 (!) 91/40  Pulse: 77 (!) 59  Resp: 18   Temp: 97.7 F (36.5 C) 97.9 F (36.6 C)  SpO2:  98% 96%   Vitals:   12/12/21 1800 12/12/21 2007 12/13/21 0438 12/13/21 0731  BP: (!) 117/58 (!) 107/59 (!) 108/49 (!) 91/40  Pulse: 80 91 77 (!) 59  Resp: 20 20 18    Temp: 98.2 F (36.8 C) 98.2 F (36.8 C) 97.7 F (36.5 C) 97.9 F (36.6 C)  TempSrc: Oral Oral Oral   SpO2: 99% 99% 98% 96%  Weight:      Height:        General: Pt is alert, awake, not in acute distress Cardiovascular: RRR, S1/S2 +, no rubs, no gallops Respiratory: CTA bilaterally, no wheezing, no rhonchi Abdominal: Soft, NT, ND, bowel sounds + Extremities: no edema, no cyanosis     The results of significant diagnostics from this hospitalization (including imaging, microbiology, ancillary and laboratory) are listed below for reference.     Microbiology: Recent Results (from the past 240 hour(s))  MRSA culture     Status: None (Preliminary result)   Collection Time: 12/12/21 12:00 AM   Specimen: Nasal Swab; Body Fluid  Result Value Ref Range Status   Specimen Description   Final    NASAL SWAB Performed at [facility], [address] [place], [place] [address]    Special Requests   Final    NASAL SWAB Performed at [facility], [address] [place], [place] [address]    Culture   Final    MODERATE STAPHYLOCOCCUS AUREUS SUSCEPTIBILITIES TO FOLLOW Performed at [facility], [address] [place], [place] [address]     Report Status PENDING  Incomplete     Labs: BNP (last 3 results) No results for input(s): BNP in the last 8760 hours. Basic Metabolic Panel: Recent Labs  Lab 12/11/21 1122 12/11/21 1834 12/12/21 0327 12/13/21 0856 12/13/21 1455  NA 128* 133* 135 141  --   K 4.9 4.7 4.3 3.1* 4.6  CL 96* 100 104 105  --   CO2 8* 15* 17* 28  --   GLUCOSE 434* 212* 132* 96  --   BUN 26* 21* 19 19  --   CREATININE 1.02 0.92 0.70 0.47*  --   CALCIUM 8.9 8.9 8.9 8.0*  --    Liver Function Tests: Recent Labs  Lab 12/11/21 1122  AST 13*  ALT 35  ALKPHOS 143*  BILITOT 2.4*  PROT 8.4*  ALBUMIN 4.5   No results for input(s): LIPASE, AMYLASE in the last 168 hours. No results for input(s): AMMONIA in the last 168 hours. CBC: Recent Labs  Lab 12/11/21 1122 12/11/21 1834 12/13/21 0856  WBC 8.4 8.2 4.4  NEUTROABS 4.5  --   --   HGB 19.5* 17.8* 12.6*  HCT 59.4* 53.0* 35.6*  MCV 99.0 97.2 93.7  PLT 376 391 227   Cardiac Enzymes: No results for input(s): CKTOTAL, CKMB, CKMBINDEX, TROPONINI in the last 168 hours. BNP: Invalid input(s): POCBNP CBG: Recent Labs  Lab 12/12/21 1548 12/12/21 2051 12/13/21 0729 12/13/21 1128 12/13/21 1612  GLUCAP 236* 289* 117* 224* 243*   D-Dimer No results for input(s): DDIMER in the last 72 hours. Hgb A1c No results for input(s): HGBA1C in the last 72 hours. Lipid Profile No results for input(s): CHOL, HDL, LDLCALC, TRIG, CHOLHDL, LDLDIRECT in the last 72 hours. Thyroid function studies No results for input(s): TSH, T4TOTAL, T3FREE, THYROIDAB in the last 72 hours.  Invalid input(s): FREET3 Anemia work up No results for input(s): VITAMINB12, FOLATE, FERRITIN, TIBC, IRON, RETICCTPCT in the last 72 hours. Urinalysis  Component Value Date/Time   COLORURINE STRAW (A) 12/11/2021 1510   APPEARANCEUR CLEAR (A) 12/11/2021 1510   LABSPEC 1.025 12/11/2021 1510   PHURINE 5.0 12/11/2021 1510   GLUCOSEU >=500 (A) 12/11/2021 1510   HGBUR NEGATIVE  12/11/2021 1510   BILIRUBINUR NEGATIVE 12/11/2021 1510   KETONESUR 80 (A) 12/11/2021 1510   PROTEINUR 30 (A) 12/11/2021 1510   NITRITE NEGATIVE 12/11/2021 1510   LEUKOCYTESUR NEGATIVE 12/11/2021 1510   Sepsis Labs Invalid input(s): PROCALCITONIN,  WBC,  LACTICIDVEN Microbiology Recent Results (from the past 240 hour(s))  MRSA culture     Status: None (Preliminary result)   Collection Time: 12/12/21 12:00 AM   Specimen: Nasal Swab; Body Fluid  Result Value Ref Range Status   Specimen Description   Final    NASAL SWAB Performed at [facility], [address], [place], [place] [address]    Special Requests   Final    NASAL SWAB Performed at [facility], [address] [place], [place] [address]    Culture   Final    MODERATE STAPHYLOCOCCUS AUREUS SUSCEPTIBILITIES TO FOLLOW Performed at [facility], [address], [place], [place] [address]    Report Status PENDING  Incomplete   Imaging MR FOOT RIGHT W WO CONTRAST  Result Date: 12/12/2021 CLINICAL DATA:  Concern for osteomyelitis. EXAM: MRI OF THE RIGHT FOREFOOT WITHOUT AND WITH CONTRAST TECHNIQUE: Multiplanar, multisequence MR imaging of the right forefoot was performed before and after the administration of intravenous contrast. CONTRAST:  6mL GADAVIST GADOBUTROL 1 MMOL/ML IV SOLN COMPARISON:  Radiographs dated Dec 12, 2021 FINDINGS: Bones/Joint/Cartilage Status post first digit amputation through the distal aspect of the proximal phalanx. There is bone marrow edema about the prior amputation site. Subchondral cystic changes and edema at the first metatarsophalangeal joint. Ligaments Lisfranc ligament is intact.  Collateral ligaments are maintained. Muscles and Tendons Muscles are normal in signal. No muscle atrophy or fluid collection/abscess. Soft tissues Soft tissue swelling and edema about the prior amputation site without evidence of drainable fluid collection or abscess. IMPRESSION: 1. Bone marrow  edema at the amputation site, likely postsurgical changes. No definite evidence of acute osteomyelitis, if there is nonhealing wound about the prior amputation site and clinical concern for acute osteomyelitis short-term follow-up examination could be considered. 2. Edema and soft tissue swelling about distal aspect of the first digit, nonspecific finding, it may be secondary to postsurgical changes or infectious/inflammatory process. Clinical correlation is suggested. Electronically Signed   By: Larose Hires D.O.   On: 12/12/2021 14:27   DG Chest Port 1 View  Result Date: 12/12/2021 CLINICAL DATA:  21 year old male with history of diabetes. Right great toe pain. EXAM: PORTABLE CHEST 1 VIEW COMPARISON:  Chest x-ray 10/31/2021. FINDINGS: Lung volumes are normal. No consolidative airspace disease. No pleural effusions. No pneumothorax. No pulmonary nodule or mass noted. Pulmonary vasculature and the cardiomediastinal silhouette are within normal limits. IMPRESSION: No radiographic evidence of acute cardiopulmonary disease. Electronically Signed   By: Trudie Reed M.D.   On: 12/12/2021 08:06   DG Toe Great Right  Result Date: 12/12/2021 CLINICAL DATA:  Right great toe pain. EXAM: RIGHT GREAT TOE COMPARISON:  Right toes MRI dated 11/08/2021. FINDINGS: Interval amputation of the distal right great toe at the level of the distal aspect of the 1st proximal phalanx. The medial and lateral distal margins are mildly irregular with tiny bone fragments. There is also a small area of loss of visible cortical bone at the  base of the 1st proximal phalanx laterally. No soft tissue gas. IMPRESSION: Findings concerning for possible early changes of osteomyelitis involving the remaining portion of the 1st proximal phalanx. Electronically Signed   By: Beckie Salts M.D.   On: 12/12/2021 09:17      Time coordinating discharge: Over 30 minutes  SIGNED:  Sunnie Nielsen DO [facility]

## 2021-12-13 NOTE — Discharge Instructions (Signed)
[facility]  [place] Address: [address], [place], [place] [address] Hours:  Open ? Closes 5?PM Phone: [phone]

## 2021-12-13 NOTE — TOC Initial Note (Signed)
Transition of Care [facility]) - Initial/Assessment Note    Patient Details  Name: Jesus Ewing MRN: [id number] Date of Birth: December 27, 2000  [facility] CM/SW Contact:    Chapman Fitch, RN Phone Number: 12/13/2021, 3:33 PM  Clinical Narrative:                  Per Psych note patient to follow up with [facility] for substance abuse Provided additional SA resources for discharge  Patient was set up with new patient appointment for PCP at [facility] on 5/1.  Not clear if patient went to that appointment or not. Encouraged patient to schedule follow up appointment with his PCP as needed.         Patient Goals and CMS Choice        Expected Discharge Plan and Services           Expected Discharge Date: 12/13/21                                    Prior Living Arrangements/Services                       Activities of Daily Living Home Assistive Devices/Equipment: CBG Meter ADL Screening (condition at time of admission) Patient's cognitive ability adequate to safely complete daily activities?: Yes Is the patient deaf or have difficulty hearing?: No Does the patient have difficulty seeing, even when wearing glasses/contacts?: No Does the patient have difficulty concentrating, remembering, or making decisions?: No Patient able to express need for assistance with ADLs?: Yes Does the patient have difficulty dressing or bathing?: No Independently performs ADLs?: Yes (appropriate for developmental age) Does the patient have difficulty walking or climbing stairs?: No Weakness of Legs: None Weakness of Arms/Hands: None  Permission Sought/Granted                  Emotional Assessment              Admission diagnosis:  Cocaine abuse (HCC) [F14.10] DKA (diabetic ketoacidosis) (HCC) [E11.10] Diabetic ketoacidosis without coma associated with type 1 diabetes mellitus (HCC) [E10.10] Patient Active Problem List   Diagnosis Date Noted    Cocaine abuse with cocaine-induced mood disorder (HCC) 12/13/2021   Substance abuse (HCC) 12/12/2021   Diabetic osteomyelitis (HCC) 11/08/2021   Hyponatremia 11/08/2021   Hyperkalemia 10/31/2021   Severe hyperglycemia due to diabetes mellitus (HCC) 07/03/2021   DKA (diabetic ketoacidosis) (HCC) 02/14/2021   Seizure (HCC) 02/14/2021   AKI (acute kidney injury) (HCC) 02/14/2021   Overdose of trazodone 02/14/2021   Depression 02/14/2021   Celiac disease 06/19/2016   PCP:  System, Provider Not In Pharmacy:   [id number] [place], [place] - [address] [place] [address] Phone: [phone] Fax: [phone]  [facility] [address] [place] - [address] [place] [address] Phone: [phone] Fax: [phone]  [facility] - [place], [place] - [address] [place] [address] Phone: [phone] Fax: [phone]     Social Determinants of Health (SDOH) Interventions    Readmission Risk Interventions     View : No data to display.

## 2021-12-13 NOTE — Plan of Care (Signed)
Pt AAOx4, no pain. VS are WNL. Possible discharge today pending psych eval. Bed is in lowest position, call light within reach. Will continue to monitor.

## 2021-12-13 NOTE — Consult Note (Signed)
[facility] Face-to-Face Psychiatry Consult   Reason for Consult:  cocaine abuse, depression Referring Physician:  Dr Lyn Hollingshead Patient Identification: Jesus Ewing MRN:  [id number] Principal Diagnosis: DKA (diabetic ketoacidosis) (HCC) Diagnosis:  Principal Problem:   DKA (diabetic ketoacidosis) (HCC) Active Problems:   Cocaine abuse with cocaine-induced mood disorder (HCC)   Celiac disease   Depression   Diabetic osteomyelitis (HCC)   Substance abuse (HCC)   Total Time spent with patient: 45 minutes  Subjective:   Jesus Ewing is a 21 y.o. male patient admitted with cocaine abuse and toe infection.  HPI:  21 yo male who presented to the ED after a cocaine binge and issues with his right toe.  On assessment today, he does report moderate depression with no suicidal ideations, one past attempt by Trazodone overdose.  Moderate anxiety related to his toe issues.  Denies paranoia, hallucinations, homicidal ideations, and withdrawal symptoms.  He is having cravings and this typically causes his relapses, discussed naltrexone and is in agreement with starting it for cravings and following up with [facility] for substance abuse IOP and mental health needs, psych cleared.  Past Psychiatric History: depression, anxiety, cocaine use d/o  Risk to Self:  none Risk to Others:  none Prior Inpatient Therapy:  once Prior Outpatient Therapy:  none  Past Medical History:  Past Medical History:  Diagnosis Date   Celiac disease    Diabetes mellitus without complication (HCC)     Past Surgical History:  Procedure Laterality Date   AMPUTATION TOE Right 11/10/2021   Procedure: AMPUTATION TOE;  Surgeon: Candelaria Stagers, DPM;  Location: [facility] ORS;  Service: Podiatry;  Laterality: Right;   NO PAST SURGERIES     Family History: History reviewed. No pertinent family history. Family Psychiatric  History: none Social History:  Social History   Substance and Sexual Activity  Alcohol Use Yes      Social History   Substance and Sexual Activity  Drug Use Yes   Types: Cocaine    Social History   Socioeconomic History   Marital status: Single    Spouse name: Not on file   Number of children: Not on file   Years of education: Not on file   Highest education level: Not on file  Occupational History   Not on file  Tobacco Use   Smoking status: Never   Smokeless tobacco: Never  Vaping Use   Vaping Use: Every day  Substance and Sexual Activity   Alcohol use: Yes   Drug use: Yes    Types: Cocaine   Sexual activity: Not on file  Other Topics Concern   Not on file  Social History Narrative   Not on file   Social Determinants of Health   Financial Resource Strain: Not on file  Food Insecurity: Not on file  Transportation Needs: Not on file  Physical Activity: Not on file  Stress: Not on file  Social Connections: Not on file   Additional Social History:    Allergies:  No Known Allergies  Labs:  Results for orders placed or performed during the hospital encounter of 12/11/21 (from the past 48 hour(s))  Comprehensive metabolic panel     Status: Abnormal   Collection Time: 12/11/21 11:22 AM  Result Value Ref Range   Sodium 128 (L) 135 - 145 mmol/L    Comment: ELECTROLYTES REPEATED TO VERIFY MJU   Potassium 4.9 3.5 - 5.1 mmol/L   Chloride 96 (L) 98 - 111 mmol/L   CO2 8 (  L) 22 - 32 mmol/L   Glucose, Bld 434 (H) 70 - 99 mg/dL    Comment: Glucose reference range applies only to samples taken after fasting for at least 8 hours.   BUN 26 (H) 6 - 20 mg/dL   Creatinine, Ser 1.61 0.61 - 1.24 mg/dL   Calcium 8.9 8.9 - 09.6 mg/dL   Total Protein 8.4 (H) 6.5 - 8.1 g/dL   Albumin 4.5 3.5 - 5.0 g/dL   AST 13 (L) 15 - 41 U/L   ALT 35 0 - 44 U/L   Alkaline Phosphatase 143 (H) 38 - 126 U/L   Total Bilirubin 2.4 (H) 0.3 - 1.2 mg/dL   GFR, Estimated >04 >54 mL/min    Comment: (NOTE) Calculated using the CKD-EPI Creatinine Equation (2021)    Anion gap 24 (H) 5 - 15     Comment: Performed at [facility], [address] [place], [place] [address]  Blood gas, venous     Status: Abnormal   Collection Time: 12/11/21 11:22 AM  Result Value Ref Range   pH, Ven 7.02 (LL) 7.25 - 7.43    Comment: CRITICAL RESULT CALLED TO, READ BACK BY AND VERIFIED WITH: Cathie Beams RN AT 1137 BY N ELINSKI RRT ON [id number]    pCO2, Ven 36 (L) 44 - 60 mmHg   pO2, Ven 40 32 - 45 mmHg   Bicarbonate 9.3 (L) 20.0 - 28.0 mmol/L   Acid-base deficit 20.9 (H) 0.0 - 2.0 mmol/L   O2 Saturation 61.4 %   Patient temperature 37.0    Collection site VENOUS     Comment: Performed at [facility], [address] [place], [place] [address]  Lactic acid, plasma     Status: None   Collection Time: 12/11/21 11:22 AM  Result Value Ref Range   Lactic Acid, Venous 1.6 0.5 - 1.9 mmol/L    Comment: Performed at [facility], [address] [place], [place] [address]  CBC with Differential     Status: Abnormal   Collection Time: 12/11/21 11:22 AM  Result Value Ref Range   WBC 8.4 4.0 - 10.5 K/uL   RBC 6.00 (H) 4.22 - 5.81 MIL/uL   Hemoglobin 19.5 (H) 13.0 - 17.0 g/dL   HCT 46.9 (H) 62.9 - 52.8 %   MCV 99.0 80.0 - 100.0 fL   MCH 32.5 26.0 - 34.0 pg   MCHC 32.8 30.0 - 36.0 g/dL   RDW 41.3 24.4 - 01.0 %   Platelets 376 150 - 400 K/uL    Comment: PLATELET COUNT CONFIRMED BY SMEAR   nRBC 0.0 0.0 - 0.2 %   Neutrophils Relative % 53 %   Neutro Abs 4.5 1.7 - 7.7 K/uL   Lymphocytes Relative 33 %   Lymphs Abs 2.7 0.7 - 4.0 K/uL   Monocytes Relative 6 %   Monocytes Absolute 0.5 0.1 - 1.0 K/uL   Eosinophils Relative 5 %   Eosinophils Absolute 0.4 0.0 - 0.5 K/uL   Basophils Relative 1 %   Basophils Absolute 0.1 0.0 - 0.1 K/uL   Immature Granulocytes 2 %   Abs Immature Granulocytes 0.13 (H) 0.00 - 0.07 K/uL    Comment: Performed at [facility], [address] [place], [place] [address]  Beta-hydroxybutyric acid     Status: Abnormal   Collection Time:  12/11/21 11:22 AM  Result Value Ref Range   Beta-Hydroxybutyric Acid >8.00 (H) 0.05 - 0.27 mmol/L    Comment: RESULT CONFIRMED BY  MANUAL DILUTION MJU Performed at [facility], [address] [place], [place] [address]   CBG monitoring, ED     Status: Abnormal   Collection Time: 12/11/21 12:14 PM  Result Value Ref Range   Glucose-Capillary 441 (H) 70 - 99 mg/dL    Comment: Glucose reference range applies only to samples taken after fasting for at least 8 hours.  CBG monitoring, ED     Status: Abnormal   Collection Time: 12/11/21  1:18 PM  Result Value Ref Range   Glucose-Capillary 321 (H) 70 - 99 mg/dL    Comment: Glucose reference range applies only to samples taken after fasting for at least 8 hours.  CBG monitoring, ED     Status: Abnormal   Collection Time: 12/11/21  3:04 PM  Result Value Ref Range   Glucose-Capillary 134 (H) 70 - 99 mg/dL    Comment: Glucose reference range applies only to samples taken after fasting for at least 8 hours.  Urine Drug Screen, Qualitative     Status: Abnormal   Collection Time: 12/11/21  3:10 PM  Result Value Ref Range   Tricyclic, Ur Screen NONE DETECTED NONE DETECTED   Amphetamines, Ur Screen POSITIVE (A) NONE DETECTED   MDMA (Ecstasy)Ur Screen NONE DETECTED NONE DETECTED   Cocaine Metabolite,Ur Bells NONE DETECTED NONE DETECTED   Opiate, Ur Screen NONE DETECTED NONE DETECTED   Phencyclidine (PCP) Ur S NONE DETECTED NONE DETECTED   Cannabinoid 50 Ng, Ur [place] NONE DETECTED NONE DETECTED   Barbiturates, Ur Screen NONE DETECTED NONE DETECTED   Benzodiazepine, Ur Scrn NONE DETECTED NONE DETECTED   Methadone Scn, Ur NONE DETECTED NONE DETECTED    Comment: (NOTE) Tricyclics + metabolites, urine    Cutoff 1000 ng/mL Amphetamines + metabolites, urine  Cutoff 1000 ng/mL MDMA (Ecstasy), urine              Cutoff 500 ng/mL Cocaine Metabolite, urine          Cutoff 300 ng/mL Opiate + metabolites, urine        Cutoff 300 ng/mL Phencyclidine  (PCP), urine         Cutoff 25 ng/mL Cannabinoid, urine                 Cutoff 50 ng/mL Barbiturates + metabolites, urine  Cutoff 200 ng/mL Benzodiazepine, urine              Cutoff 200 ng/mL Methadone, urine                   Cutoff 300 ng/mL  The urine drug screen provides only a preliminary, unconfirmed analytical test result and should not be used for non-medical purposes. Clinical consideration and professional judgment should be applied to any positive drug screen result due to possible interfering substances. A more specific alternate chemical method must be used in order to obtain a confirmed analytical result. Gas chromatography / mass spectrometry (GC/MS) is the preferred confirm atory method. Performed at [facility], [address] [place], [place] [address]   Urinalysis, Routine w reflex microscopic     Status: Abnormal   Collection Time: 12/11/21  3:10 PM  Result Value Ref Range   Color, Urine STRAW (A) YELLOW   APPearance CLEAR (A) CLEAR   Specific Gravity, Urine 1.025 1.005 - 1.030   pH 5.0 5.0 - 8.0   Glucose, UA >=500 (A) NEGATIVE mg/dL   Hgb urine dipstick NEGATIVE NEGATIVE   Bilirubin Urine NEGATIVE NEGATIVE  Ketones, ur 80 (A) NEGATIVE mg/dL   Protein, ur 30 (A) NEGATIVE mg/dL   Nitrite NEGATIVE NEGATIVE   Leukocytes,Ua NEGATIVE NEGATIVE   RBC / HPF 0-5 0 - 5 RBC/hpf   WBC, UA 0-5 0 - 5 WBC/hpf   Bacteria, UA NONE SEEN NONE SEEN   Squamous Epithelial / LPF 0-5 0 - 5   Mucus PRESENT     Comment: Performed at [facility], [address] [place], [place] [address]  CBG monitoring, ED     Status: Abnormal   Collection Time: 12/11/21  5:04 PM  Result Value Ref Range   Glucose-Capillary 144 (H) 70 - 99 mg/dL    Comment: Glucose reference range applies only to samples taken after fasting for at least 8 hours.  Lactic acid, plasma     Status: None   Collection Time: 12/11/21  6:34 PM  Result Value Ref Range   Lactic Acid, Venous 1.6  0.5 - 1.9 mmol/L    Comment: Performed at [facility], [address] [place], [place] [address]  Basic metabolic panel     Status: Abnormal   Collection Time: 12/11/21  6:34 PM  Result Value Ref Range   Sodium 133 (L) 135 - 145 mmol/L   Potassium 4.7 3.5 - 5.1 mmol/L   Chloride 100 98 - 111 mmol/L   CO2 15 (L) 22 - 32 mmol/L   Glucose, Bld 212 (H) 70 - 99 mg/dL    Comment: Glucose reference range applies only to samples taken after fasting for at least 8 hours.   BUN 21 (H) 6 - 20 mg/dL   Creatinine, Ser 7.01 0.61 - 1.24 mg/dL   Calcium 8.9 8.9 - 77.9 mg/dL   GFR, Estimated >39 >03 mL/min    Comment: (NOTE) Calculated using the CKD-EPI Creatinine Equation (2021)    Anion gap 18 (H) 5 - 15    Comment: Performed at [facility], [address] [place], [place] [address]  CBC     Status: Abnormal   Collection Time: 12/11/21  6:34 PM  Result Value Ref Range   WBC 8.2 4.0 - 10.5 K/uL   RBC 5.45 4.22 - 5.81 MIL/uL   Hemoglobin 17.8 (H) 13.0 - 17.0 g/dL   HCT 30.0 (H) 76.2 - 26.3 %   MCV 97.2 80.0 - 100.0 fL   MCH 32.7 26.0 - 34.0 pg   MCHC 33.6 30.0 - 36.0 g/dL   RDW 33.5 45.6 - 25.6 %   Platelets 391 150 - 400 K/uL   nRBC 0.0 0.0 - 0.2 %    Comment: Performed at [facility], [address], [place], [place] [address]  Beta-hydroxybutyric acid     Status: Abnormal   Collection Time: 12/11/21  6:34 PM  Result Value Ref Range   Beta-Hydroxybutyric Acid 5.82 (H) 0.05 - 0.27 mmol/L    Comment: RESULT CONFIRMED BY MANUAL DILUTION DLB Performed at [facility], [address] [place], [place] [address]   CBG monitoring, ED     Status: Abnormal   Collection Time: 12/11/21  8:17 PM  Result Value Ref Range   Glucose-Capillary 208 (H) 70 - 99 mg/dL    Comment: Glucose reference range applies only to samples taken after fasting for at least 8 hours.   Comment 1 Document in Chart   CBG monitoring, ED     Status: Abnormal   Collection Time:  12/11/21 10:46 PM  Result Value Ref Range   Glucose-Capillary 235 (  H) 70 - 99 mg/dL    Comment: Glucose reference range applies only to samples taken after fasting for at least 8 hours.   Comment 1 Document in Chart   Blood gas, venous     Status: Abnormal (Preliminary result)   Collection Time: 12/11/21 10:50 PM  Result Value Ref Range   pH, Ven 7.3 7.25 - 7.43   pCO2, Ven 28 (L) 44 - 60 mmHg   pO2, Ven 66 (H) 32 - 45 mmHg   Bicarbonate 13.8 (L) 20.0 - 28.0 mmol/L   Acid-base deficit 11.1 (H) 0.0 - 2.0 mmol/L   O2 Saturation 93.9 %   Patient temperature 37.0     Comment: Performed at [facility], [address] [place], [place] [address]   Collection site PENDING   Glucose, capillary     Status: Abnormal   Collection Time: 12/11/21 11:33 PM  Result Value Ref Range   Glucose-Capillary 183 (H) 70 - 99 mg/dL    Comment: Glucose reference range applies only to samples taken after fasting for at least 8 hours.  Glucose, capillary     Status: Abnormal   Collection Time: 12/12/21 12:30 AM  Result Value Ref Range   Glucose-Capillary 158 (H) 70 - 99 mg/dL    Comment: Glucose reference range applies only to samples taken after fasting for at least 8 hours.  Glucose, capillary     Status: None   Collection Time: 12/12/21  1:35 AM  Result Value Ref Range   Glucose-Capillary 96 70 - 99 mg/dL    Comment: Glucose reference range applies only to samples taken after fasting for at least 8 hours.   Comment 1 QC Due   Glucose, capillary     Status: Abnormal   Collection Time: 12/12/21  1:57 AM  Result Value Ref Range   Glucose-Capillary 104 (H) 70 - 99 mg/dL    Comment: Glucose reference range applies only to samples taken after fasting for at least 8 hours.  Glucose, capillary     Status: Abnormal   Collection Time: 12/12/21  3:05 AM  Result Value Ref Range   Glucose-Capillary 124 (H) 70 - 99 mg/dL    Comment: Glucose reference range applies only to samples taken after fasting  for at least 8 hours.  Basic metabolic panel     Status: Abnormal   Collection Time: 12/12/21  3:27 AM  Result Value Ref Range   Sodium 135 135 - 145 mmol/L   Potassium 4.3 3.5 - 5.1 mmol/L   Chloride 104 98 - 111 mmol/L   CO2 17 (L) 22 - 32 mmol/L   Glucose, Bld 132 (H) 70 - 99 mg/dL    Comment: Glucose reference range applies only to samples taken after fasting for at least 8 hours.   BUN 19 6 - 20 mg/dL   Creatinine, Ser 6.04 0.61 - 1.24 mg/dL   Calcium 8.9 8.9 - 54.0 mg/dL   GFR, Estimated >98 >11 mL/min    Comment: (NOTE) Calculated using the CKD-EPI Creatinine Equation (2021)    Anion gap 14 5 - 15    Comment: Performed at [facility], [address] [place], [place] [address]  Beta-hydroxybutyric acid     Status: Abnormal   Collection Time: 12/12/21  3:27 AM  Result Value Ref Range   Beta-Hydroxybutyric Acid 3.39 (H) 0.05 - 0.27 mmol/L    Comment: Performed at [facility], [address] [place], [place] [address]  Glucose, capillary  Status: Abnormal   Collection Time: 12/12/21  3:58 AM  Result Value Ref Range   Glucose-Capillary 113 (H) 70 - 99 mg/dL    Comment: Glucose reference range applies only to samples taken after fasting for at least 8 hours.  Glucose, capillary     Status: Abnormal   Collection Time: 12/12/21  5:22 AM  Result Value Ref Range   Glucose-Capillary 124 (H) 70 - 99 mg/dL    Comment: Glucose reference range applies only to samples taken after fasting for at least 8 hours.  Glucose, capillary     Status: Abnormal   Collection Time: 12/12/21  6:39 AM  Result Value Ref Range   Glucose-Capillary 127 (H) 70 - 99 mg/dL    Comment: Glucose reference range applies only to samples taken after fasting for at least 8 hours.  Beta-hydroxybutyric acid     Status: Abnormal   Collection Time: 12/12/21  7:03 AM  Result Value Ref Range   Beta-Hydroxybutyric Acid 1.53 (H) 0.05 - 0.27 mmol/L    Comment: Performed at [facility], [address] [place], [place] [address]  Glucose, capillary     Status: None   Collection Time: 12/12/21  7:13 AM  Result Value Ref Range   Glucose-Capillary 89 70 - 99 mg/dL    Comment: Glucose reference range applies only to samples taken after fasting for at least 8 hours.  Glucose, capillary     Status: Abnormal   Collection Time: 12/12/21  7:50 AM  Result Value Ref Range   Glucose-Capillary 61 (L) 70 - 99 mg/dL    Comment: Glucose reference range applies only to samples taken after fasting for at least 8 hours.  Glucose, capillary     Status: Abnormal   Collection Time: 12/12/21 10:25 AM  Result Value Ref Range   Glucose-Capillary 124 (H) 70 - 99 mg/dL    Comment: Glucose reference range applies only to samples taken after fasting for at least 8 hours.  Glucose, capillary     Status: Abnormal   Collection Time: 12/12/21 11:54 AM  Result Value Ref Range   Glucose-Capillary 133 (H) 70 - 99 mg/dL    Comment: Glucose reference range applies only to samples taken after fasting for at least 8 hours.  Glucose, capillary     Status: Abnormal   Collection Time: 12/12/21  3:48 PM  Result Value Ref Range   Glucose-Capillary 236 (H) 70 - 99 mg/dL    Comment: Glucose reference range applies only to samples taken after fasting for at least 8 hours.  Glucose, capillary     Status: Abnormal   Collection Time: 12/12/21  8:51 PM  Result Value Ref Range   Glucose-Capillary 289 (H) 70 - 99 mg/dL    Comment: Glucose reference range applies only to samples taken after fasting for at least 8 hours.  Glucose, capillary     Status: Abnormal   Collection Time: 12/13/21  7:29 AM  Result Value Ref Range   Glucose-Capillary 117 (H) 70 - 99 mg/dL    Comment: Glucose reference range applies only to samples taken after fasting for at least 8 hours.  Basic metabolic panel     Status: Abnormal   Collection Time: 12/13/21  8:56 AM  Result Value Ref Range   Sodium 141 135 - 145 mmol/L   Potassium  3.1 (L) 3.5 - 5.1 mmol/L   Chloride 105 98 - 111 mmol/L   CO2 28 22 - 32 mmol/L   Glucose, Bld  96 70 - 99 mg/dL    Comment: Glucose reference range applies only to samples taken after fasting for at least 8 hours.   BUN 19 6 - 20 mg/dL   Creatinine, Ser 2.59 (L) 0.61 - 1.24 mg/dL   Calcium 8.0 (L) 8.9 - 10.3 mg/dL   GFR, Estimated >56 >38 mL/min    Comment: (NOTE) Calculated using the CKD-EPI Creatinine Equation (2021)    Anion gap 8 5 - 15    Comment: Performed at [facility], [address] [place], [place] [address]  CBC     Status: Abnormal   Collection Time: 12/13/21  8:56 AM  Result Value Ref Range   WBC 4.4 4.0 - 10.5 K/uL   RBC 3.80 (L) 4.22 - 5.81 MIL/uL   Hemoglobin 12.6 (L) 13.0 - 17.0 g/dL    Comment: REPEATED TO VERIFY   HCT 35.6 (L) 39.0 - 52.0 %   MCV 93.7 80.0 - 100.0 fL   MCH 33.2 26.0 - 34.0 pg   MCHC 35.4 30.0 - 36.0 g/dL   RDW 32.9 51.8 - 84.1 %   Platelets 227 150 - 400 K/uL   nRBC 0.0 0.0 - 0.2 %    Comment: Performed at [facility], [address] [place], [place] [address]  Beta-hydroxybutyric acid     Status: None   Collection Time: 12/13/21  8:56 AM  Result Value Ref Range   Beta-Hydroxybutyric Acid 0.15 0.05 - 0.27 mmol/L    Comment: Performed at [facility], [address] [place], [place] [address]    Current Facility-Administered Medications  Medication Dose Route Frequency Provider Last Rate Last Admin   Chlorhexidine Gluconate Cloth 2 % PADS 6 each  6 each Topical Daily Foust, Katy L, NP   6 each at 12/13/21 0903   dextrose 50 % solution 0-50 mL  0-50 mL Intravenous PRN Merwyn Katos, MD       enoxaparin (LOVENOX) injection 40 mg  40 mg Subcutaneous Q24H Skip Mayer A, MD       escitalopram (LEXAPRO) tablet 10 mg  10 mg Oral Daily Skip Mayer A, MD   10 mg at 12/13/21 0932   gabapentin (NEURONTIN) capsule 300 mg  300 mg Oral BID Skip Mayer A, MD   300 mg at 12/13/21 3557   insulin  aspart (novoLOG) injection 0-5 Units  0-5 Units Subcutaneous QHS Foust, Katy L, NP   3 Units at 12/12/21 2320   insulin aspart (novoLOG) injection 0-9 Units  0-9 Units Subcutaneous TID WC Foust, Katy L, NP   3 Units at 12/12/21 1722   insulin aspart (novoLOG) injection 3 Units  3 Units Subcutaneous TID WC Foust, Katy L, NP   3 Units at 12/13/21 0900   insulin detemir (LEVEMIR) injection 50 Units  50 Units Subcutaneous Q24H Foust, Katy L, NP   50 Units at 12/13/21 3220   lactated ringers bolus 1,080 mL  20 mL/kg Intravenous Once Merwyn Katos, MD       lactated ringers infusion   Intravenous Continuous Foust, Katy L, NP 125 mL/hr at 12/13/21 1000 New Bag at 12/13/21 1000   levETIRAcetam (KEPPRA) tablet 500 mg  500 mg Oral BID Skip Mayer A, MD   500 mg at 12/13/21 2542   multivitamin with minerals tablet 1 tablet  1 tablet Oral Daily Skip Mayer A, MD   1 tablet at 12/13/21 0903   ondansetron (ZOFRAN) 8 mg in sodium chloride 0.9 % 50 mL  IVPB  8 mg Intravenous Once Bradler, Clent JacksEvan K, MD       phenol (CHLORASEPTIC) mouth spray 1 spray  1 spray Mouth/Throat PRN Foust, Katy L, NP       potassium chloride 10 mEq in 100 mL IVPB  10 mEq Intravenous Q1 Hr x 3 Sunnie Nielsenlexander, Natalie, DO 100 mL/hr at 12/13/21 1004 10 mEq at 12/13/21 1004    Musculoskeletal: Strength & Muscle Tone: within normal limits Gait & Station:  did not witness Patient leans: N/A  Psychiatric Specialty Exam: Physical Exam Vitals and nursing note reviewed.  Constitutional:      Appearance: Normal appearance.  HENT:     Head: Normocephalic.     Nose: Nose normal.  Pulmonary:     Effort: Pulmonary effort is normal.  Musculoskeletal:     Cervical back: Normal range of motion.  Neurological:     General: No focal deficit present.     Mental Status: He is alert and oriented to person, place, and time.  Psychiatric:        Attention and Perception: Attention normal. He perceives auditory hallucinations.        Mood and  Affect: Mood is anxious and depressed.        Speech: Speech normal.        Behavior: Behavior normal. Behavior is cooperative.        Thought Content: Thought content normal.        Cognition and Memory: Cognition and memory normal.        Judgment: Judgment normal.    Review of Systems  Musculoskeletal:        Toe pain  Psychiatric/Behavioral:  Positive for depression. The patient is nervous/anxious.   All other systems reviewed and are negative.  Blood pressure (!) 91/40, pulse (!) 59, temperature 97.9 F (36.6 C), resp. rate 18, height 5\' 10"  (1.778 m), weight 57.3 kg, SpO2 96 %.Body mass index is 18.13 kg/m.  General Appearance: Casual  Eye Contact:  Good  Speech:  Normal Rate  Volume:  Normal  Mood:  Anxious and Depressed  Affect:  Congruent  Thought Process:  Coherent and Descriptions of Associations: Intact  Orientation:  Full (Time, Place, and Person)  Thought Content:  WDL and Logical  Suicidal Thoughts:  No  Homicidal Thoughts:  No  Memory:  Immediate;   Good Recent;   Good Remote;   Good  Judgement:  Fair  Insight:  Fair  Psychomotor Activity:  Decreased  Concentration:  Concentration: Good and Attention Span: Good  Recall:  Good  Fund of Knowledge:  Good  Language:  Good  Akathisia:  No  Handed:  Right  AIMS (if indicated):     Assets:  Housing Leisure Time Resilience Social Support  ADL's:  Intact  Cognition:  WNL  Sleep:       Physical Exam: Physical Exam Vitals and nursing note reviewed.  Constitutional:      Appearance: Normal appearance.  HENT:     Head: Normocephalic.     Nose: Nose normal.  Pulmonary:     Effort: Pulmonary effort is normal.  Musculoskeletal:     Cervical back: Normal range of motion.  Neurological:     General: No focal deficit present.     Mental Status: He is alert and oriented to person, place, and time.  Psychiatric:        Attention and Perception: Attention normal. He perceives auditory hallucinations.         Mood and  Affect: Mood is anxious and depressed.        Speech: Speech normal.        Behavior: Behavior normal. Behavior is cooperative.        Thought Content: Thought content normal.        Cognition and Memory: Cognition and memory normal.        Judgment: Judgment normal.   Review of Systems  Musculoskeletal:        Toe pain  Psychiatric/Behavioral:  Positive for depression. The patient is nervous/anxious.   All other systems reviewed and are negative. Blood pressure (!) 91/40, pulse (!) 59, temperature 97.9 F (36.6 C), resp. rate 18, height 5\' 10"  (1.778 m), weight 57.3 kg, SpO2 96 %. Body mass index is 18.13 kg/m.  Treatment Plan Summary: Cocaine abuse with cocaine induced mood disorder: Started naltrexone 25 mg today and 50 mg tomorrow to continue daily Follow up with [facility]  Disposition: No evidence of imminent risk to self or others at present.   Patient does not meet criteria for psychiatric inpatient admission. Supportive therapy provided about ongoing stressors.  , NP 12/13/2021 10:31 AM

## 2021-12-13 NOTE — Plan of Care (Incomplete)
AVS and education provided. IV removed with no complications. VS are WNL. Pt transported by [facility] to main lobby where mother will transport him home.

## 2021-12-14 LAB — MRSA CULTURE: Culture: NOT DETECTED

## 2021-12-16 ENCOUNTER — Other Ambulatory Visit (HOSPITAL_BASED_OUTPATIENT_CLINIC_OR_DEPARTMENT_OTHER): Payer: Self-pay | Admitting: Osteopathic Medicine

## 2021-12-22 ENCOUNTER — Encounter: Payer: 59 | Admitting: Podiatry

## 2022-02-20 ENCOUNTER — Emergency Department: Payer: 59

## 2022-02-20 ENCOUNTER — Observation Stay
Admission: EM | Admit: 2022-02-20 | Discharge: 2022-02-21 | Disposition: A | Discharge status: Home / Self Care | Payer: 59 | Attending: Internal Medicine | Admitting: Internal Medicine

## 2022-02-20 ENCOUNTER — Encounter: Payer: Self-pay | Admitting: Radiology

## 2022-02-20 DIAGNOSIS — G40909 Epilepsy, unspecified, not intractable, without status epilepticus: Secondary | ICD-10-CM | POA: Insufficient documentation

## 2022-02-20 DIAGNOSIS — E1065 Type 1 diabetes mellitus with hyperglycemia: Secondary | ICD-10-CM | POA: Diagnosis not present

## 2022-02-20 DIAGNOSIS — F329 Major depressive disorder, single episode, unspecified: Secondary | ICD-10-CM | POA: Diagnosis not present

## 2022-02-20 DIAGNOSIS — R937 Abnormal findings on diagnostic imaging of other parts of musculoskeletal system: Secondary | ICD-10-CM | POA: Diagnosis present

## 2022-02-20 DIAGNOSIS — Z89411 Acquired absence of right great toe: Secondary | ICD-10-CM | POA: Insufficient documentation

## 2022-02-20 DIAGNOSIS — F1910 Other psychoactive substance abuse, uncomplicated: Secondary | ICD-10-CM | POA: Diagnosis not present

## 2022-02-20 DIAGNOSIS — Z91148 Patient's other noncompliance with medication regimen for other reason: Secondary | ICD-10-CM

## 2022-02-20 DIAGNOSIS — E11610 Type 2 diabetes mellitus with diabetic neuropathic arthropathy: Secondary | ICD-10-CM

## 2022-02-20 DIAGNOSIS — M14679 Charcot's joint, unspecified ankle and foot: Secondary | ICD-10-CM | POA: Diagnosis not present

## 2022-02-20 DIAGNOSIS — M86171 Other acute osteomyelitis, right ankle and foot: Principal | ICD-10-CM

## 2022-02-20 DIAGNOSIS — F32A Depression, unspecified: Secondary | ICD-10-CM | POA: Diagnosis present

## 2022-02-20 DIAGNOSIS — F419 Anxiety disorder, unspecified: Secondary | ICD-10-CM | POA: Diagnosis present

## 2022-02-20 DIAGNOSIS — M79671 Pain in right foot: Secondary | ICD-10-CM | POA: Diagnosis present

## 2022-02-20 DIAGNOSIS — R569 Unspecified convulsions: Secondary | ICD-10-CM

## 2022-02-20 LAB — CBC WITH DIFFERENTIAL/PLATELET
Abs Immature Granulocytes: 0.03 10*3/uL (ref 0.00–0.07)
Basophils Absolute: 0.1 10*3/uL (ref 0.0–0.1)
Basophils Relative: 1 %
Eosinophils Absolute: 0.7 10*3/uL — ABNORMAL HIGH (ref 0.0–0.5)
Eosinophils Relative: 11 %
HCT: 47 % (ref 39.0–52.0)
Hemoglobin: 16.5 g/dL (ref 13.0–17.0)
Immature Granulocytes: 1 %
Lymphocytes Relative: 36 %
Lymphs Abs: 2.2 10*3/uL (ref 0.7–4.0)
MCH: 33 pg (ref 26.0–34.0)
MCHC: 35.1 g/dL (ref 30.0–36.0)
MCV: 94 fL (ref 80.0–100.0)
Monocytes Absolute: 0.5 10*3/uL (ref 0.1–1.0)
Monocytes Relative: 8 %
Neutro Abs: 2.7 10*3/uL (ref 1.7–7.7)
Neutrophils Relative %: 43 %
Platelets: 317 10*3/uL (ref 150–400)
RBC: 5 MIL/uL (ref 4.22–5.81)
RDW: 11.6 % (ref 11.5–15.5)
WBC: 6.2 10*3/uL (ref 4.0–10.5)
nRBC: 0 % (ref 0.0–0.2)

## 2022-02-20 LAB — BASIC METABOLIC PANEL
Anion gap: 12 (ref 5–15)
BUN: 27 mg/dL — ABNORMAL HIGH (ref 6–20)
CO2: 26 mmol/L (ref 22–32)
Calcium: 9.4 mg/dL (ref 8.9–10.3)
Chloride: 98 mmol/L (ref 98–111)
Creatinine, Ser: 0.78 mg/dL (ref 0.61–1.24)
GFR, Estimated: >60 mL/min (ref >60)
Glucose, Bld: 457 mg/dL — ABNORMAL HIGH (ref 70–99)
Potassium: 4.6 mmol/L (ref 3.5–5.1)
Sodium: 136 mmol/L (ref 135–145)

## 2022-02-20 LAB — CBG MONITORING, ED
Glucose-Capillary: 466 mg/dL — ABNORMAL HIGH (ref 70–99)
Glucose-Capillary: 292 mg/dL — ABNORMAL HIGH (ref 70–99)
Glucose-Capillary: 206 mg/dL — ABNORMAL HIGH (ref 70–99)

## 2022-02-20 MED ORDER — METRONIDAZOLE 500 MG/100ML IV SOLN
500.0000 mg | Freq: Once | INTRAVENOUS | Status: AC
  Administered 2022-02-21: 500 mg via INTRAVENOUS
  Filled 2022-02-20: qty 100

## 2022-02-20 MED ORDER — GADOBUTROL 1 MMOL/ML IV SOLN
5.0000 mL | Freq: Once | INTRAVENOUS | Status: AC | PRN
  Administered 2022-02-20: 5 mL via INTRAVENOUS

## 2022-02-20 MED ORDER — CEFEPIME HCL 2 G IV SOLR
2.0000 g | Freq: Once | INTRAVENOUS | Status: AC
Start: 2022-02-20 — End: 2022-02-21
  Administered 2022-02-20: 2 g via INTRAVENOUS
  Filled 2022-02-20: qty 12.5

## 2022-02-20 MED ORDER — VANCOMYCIN HCL 1250 MG/250ML IV SOLN
1250.0000 mg | Freq: Once | INTRAVENOUS | Status: AC
Start: 2022-02-20 — End: 2022-02-21
  Administered 2022-02-21: 1250 mg via INTRAVENOUS
  Filled 2022-02-20: qty 250

## 2022-02-20 MED ORDER — SODIUM CHLORIDE 0.9 % IV BOLUS
1000.0000 mL | Freq: Once | INTRAVENOUS | Status: AC
  Administered 2022-02-20: 1000 mL via INTRAVENOUS

## 2022-02-20 MED ORDER — MORPHINE SULFATE (PF) 4 MG/ML IV SOLN
4.0000 mg | Freq: Once | INTRAVENOUS | Status: AC
  Administered 2022-02-20: 4 mg via INTRAVENOUS
  Filled 2022-02-20: qty 1

## 2022-02-20 NOTE — H&P (Incomplete)
History and Physical    Jesus Ewing EHU:[id number] DOB: June 08, 2001 DOA: 02/20/2022  PCP: Pcp, No    Patient coming from:  Home    Chief Complaint:  Rt foot pain.   HPI:  Jesus Ewing is a 21 y.o. male seen in ed with complaints of rt foot pain s/p amputation and felt pop while running and is sore and tender. Also with c/o pain in right foot.  Pt has had distal phalanx amputation. MRI of foot is pending.  Pt was having sweats.  Patient does not report any headache speech or gait issues fevers nausea vomiting abdominal pain.  Bowel or bladder complaints.  Pt has past medical history of diabetes mellitus type 1, osteomyelitis, history of seizures, history of cocaine use.   ED Course:   Vitals:   02/20/22 1920 02/20/22 1921 02/20/22 2133  BP:  123/83 (!) 125/96  Pulse:  100 91  Resp:  18 19  Temp:  98.4 F (36.9 C)   TempSrc:  Oral   SpO2:  98% 98%  Weight: 58 kg    Height: 5\' 10"  (1.778 m)    In the emergency room patient is alert awake and oriented afebrile with O2 sats of 98% on room air. BMP shows glucose of 457 with a creatinine of 0.78. LFTs added on. CBC within normal limits. CRP added on. Sed rate added on.    Review of Systems:  ROS    Past Medical History:  Diagnosis Date  . Celiac disease   . Diabetes mellitus without complication [facility])     Past Surgical History:  Procedure Laterality Date  . AMPUTATION TOE Right 11/10/2021   Procedure: AMPUTATION TOE;  Surgeon: 11/12/2021, DPM;  Location: [facility] ORS;  Service: Podiatry;  Laterality: Right;  . NO PAST SURGERIES       reports that he has never smoked. He has never used smokeless tobacco. He reports current alcohol use. He reports current drug use. Drug: Cocaine.  No Known Allergies  History reviewed. No pertinent family history.  Prior to Admission medications   Medication Sig Start Date End Date Taking? Authorizing Provider  escitalopram (LEXAPRO) 10 MG tablet Take 10  mg by mouth daily. 11/22/21   [provider]  gabapentin (NEURONTIN) 300 MG capsule Take 1 capsule (300 mg total) by mouth 2 (two) times daily. 11/17/21   01/17/22, DPM  insulin lispro (HUMALOG) 100 UNIT/ML KwikPen Inject 0-50 Units into the skin as directed. 07/24/18   [provider]  LANTUS SOLOSTAR 100 UNIT/ML Solostar Pen Inject 50 Units into the skin at bedtime. Patient not taking: Reported on 12/11/2021 05/04/19   [provider]  levETIRAcetam (KEPPRA) 500 MG tablet Take 1 tablet (500 mg total) by mouth 2 (two) times daily. 07/05/21 12/11/21  12/13/21, MD  Multiple Vitamin (MULTIVITAMIN WITH MINERALS) TABS tablet Take 1 tablet by mouth daily. 11/12/21   11/14/21, DO  naltrexone (DEPADE) 50 MG tablet Take 1 tablet (50 mg total) by mouth daily. 12/14/21   12/16/21, DO    Physical Exam: Vitals:   02/20/22 1920 02/20/22 1921 02/20/22 2133  BP:  123/83 (!) 125/96  Pulse:  100 91  Resp:  18 19  Temp:  98.4 F (36.9 C)   TempSrc:  Oral   SpO2:  98% 98%  Weight: 58 kg    Height: 5\' 10"  (1.778 m)     Physical Exam   Labs on Admission: I have personally reviewed  following labs and imaging studies BMET Recent Labs  Lab 02/20/22 1918  NA 136  K 4.6  CL 98  CO2 26  BUN 27*  CREATININE 0.78  GLUCOSE 457*   Electrolytes Recent Labs  Lab 02/20/22 1918  CALCIUM 9.4   Sepsis Markers No results for input(s): "LATICACIDVEN", "PROCALCITON", "O2SATVEN" in the last 168 hours. ABG No results for input(s): "PHART", "PCO2ART", "PO2ART" in the last 168 hours. Liver Enzymes No results for input(s): "AST", "ALT", "ALKPHOS", "BILITOT", "ALBUMIN" in the last 168 hours. Cardiac Enzymes No results for input(s): "TROPONINI", "PROBNP" in the last 168 hours. No results found for: "DDIMER" Coag's No results for input(s): "APTT", "INR" in the last 168 hours.  No results found for this or any previous visit (from the past 240  hour(s)).   Current Facility-Administered Medications:  .  ceFEPIme (MAXIPIME) 2 g in sodium chloride 0.9 % 100 mL IVPB, 2 g, Intravenous, Once, Otelia Sergeant, RPH, Last Rate: 200 mL/hr at 02/20/22 2354, 2 g at 02/20/22 2354 .  metroNIDAZOLE (FLAGYL) IVPB 500 mg, 500 mg, Intravenous, Once, Sharyn Creamer, MD .  vancomycin (VANCOREADY) IVPB 1250 mg/250 mL, 1,250 mg, Intravenous, Once, Belue, Lendon Collar, RPH  Current Outpatient Medications:  .  escitalopram (LEXAPRO) 10 MG tablet, Take 10 mg by mouth daily., Disp: , Rfl:  .  gabapentin (NEURONTIN) 300 MG capsule, Take 1 capsule (300 mg total) by mouth 2 (two) times daily., Disp: 60 capsule, Rfl: 0 .  insulin lispro (HUMALOG) 100 UNIT/ML KwikPen, Inject 0-50 Units into the skin as directed., Disp: , Rfl:  .  LANTUS SOLOSTAR 100 UNIT/ML Solostar Pen, Inject 50 Units into the skin at bedtime. (Patient not taking: Reported on 12/11/2021), Disp: , Rfl:  .  levETIRAcetam (KEPPRA) 500 MG tablet, Take 1 tablet (500 mg total) by mouth 2 (two) times daily., Disp: 60 tablet, Rfl: 0 .  Multiple Vitamin (MULTIVITAMIN WITH MINERALS) TABS tablet, Take 1 tablet by mouth daily., Disp: 30 tablet, Rfl: 0 .  naltrexone (DEPADE) 50 MG tablet, Take 1 tablet (50 mg total) by mouth daily., Disp: 30 tablet, Rfl: 0  COVID-19 Labs No results for input(s): "DDIMER", "FERRITIN", "LDH", "CRP" in the last 72 hours. Lab Results  Component Value Date   SARSCOV2NAA NEGATIVE 07/03/2021   SARSCOV2NAA NEGATIVE 02/14/2021   SARSCOV2NAA POSITIVE (A) 05/26/2019    Radiological Exams on Admission: DG Foot Complete Right  Result Date: 02/20/2022 CLINICAL DATA:  Sore that has gotten worse on top of the right foot. EXAM: RIGHT FOOT COMPLETE - 3+ VIEW COMPARISON:  Dec 12, 2021 FINDINGS: Prior amputation of the right great toe is seen along the distal aspect of the proximal phalanx. There is no evidence of an acute fracture or dislocation. There is no evidence of arthropathy or other focal  bone abnormality. Soft tissues are unremarkable. IMPRESSION: 1. Prior amputation of the right great toe. 2. No acute osseous abnormality. Electronically Signed   By: Aram Candela M.D.   On: 02/20/2022 19:23    EKG: Independently reviewed.  None   Assessment and Plan: No notes have been filed under this hospital service. Service: Hospitalist           DVT prophylaxis:  Heparin  Code Status:  Full code  Family Communication:  Micai, Apolinar (Mother)  [phone] (Mobile)   Disposition Plan:  Home  Consults called:  Podiatry: [facility]. Dr.McDonald.   Admission status: Inpatient.     Gertha Calkin MD [facility]  6  PM- 2 AM. Please contact me via secure Chat 6 PM-2 AM. [phone] ( Pager ) To contact the [facility] Attending or Consulting provider 7A - 7P or covering provider during after hours 7P -7A, for this patient.   Check the care team in [facility] and look for a) attending/consulting TRH provider listed and b) the [facility] team listed Log into [email or web address] and use [place]'s universal password to access. If you do not have the password, please contact the hospital operator. Locate the [facility] provider you are looking for under [facility] and page to a number that you can be directly reached. If you still have difficulty reaching the provider, please page the [facility] (Director on Call) for the Hospitalists listed on amion for assistance. [email or web address] 02/20/2022, 11:55 PM

## 2022-02-20 NOTE — Progress Notes (Signed)
PHARMACY -  BRIEF ANTIBIOTIC NOTE   Pharmacy has received consult(s) for Cefepime & Vancomycin from an ED provider.  The patient's profile has been reviewed for ht/wt/allergies/indication/available labs.    One time order(s) placed for Cefepime 2 gm and Vancomycin 1250 mg.  Further antibiotics/pharmacy consults should be ordered by admitting physician if indicated.                       Thank you, Otelia Sergeant, PharmD, [facility] 02/20/2022 11:40 PM

## 2022-02-20 NOTE — H&P (Signed)
History and Physical    Jesus Ewing [id number] DOB: 2001-07-15 DOA: 02/20/2022  PCP: Pcp, No    Patient coming from:  Home    Chief Complaint:  Rt foot pain.   HPI:  Jesus Ewing is a 21 y.o. male seen in ed with complaints of rt foot pain s/p amputation and felt pop while running and is sore and tender. Also with c/o pain in right foot.  Pt has had distal phalanx amputation. MRI of foot is pending.  Pt was having sweats.  Patient does not report any headache speech or gait issues fevers nausea vomiting abdominal pain.  Bowel or bladder complaints.  Pt has past medical history of diabetes mellitus type 1, osteomyelitis, history of seizures, history of cocaine use. Patient states he lives behind his parents home and has good support system.I asked him barriers to reaching goal for his sugars and he said himself he is not motivated to take care of himself.no SI currently and has had thoughts in past but not today.  Says he has had diabetes since the age of 14 and he is not motivated to take care of himself.  He asked about any financial constraints and he says that no he works and his only barrier is himself.  Discussed with patient to allow [place] to help him and offered different resources for him to reach goal for his sugars.  ED Course:   Vitals:   02/20/22 1920 02/20/22 1921 02/20/22 2133 02/21/22 0035  BP:  123/83 (!) 125/96 128/73  Pulse:  100 91 91  Resp:  18 19 18   Temp:  98.4 F (36.9 C)  98.5 F (36.9 C)  TempSrc:  Oral  Oral  SpO2:  98% 98% 96%  Weight: 58 kg     Height: 5\' 10"  (1.778 m)     In the emergency room patient is alert awake and oriented afebrile with O2 sats of 98% on room air. BMP shows glucose of 457 with a creatinine of 0.78. LFTs added on. CBC within normal limits. CRP added on. Sed rate added on. MRI shows concerning findings with marrow edema for Osteomyelitis official report still pending. Podiatry has been consulted by  EDMD.  Review of Systems:  Review of Systems  Constitutional:  Positive for diaphoresis and malaise/fatigue.  HENT: Negative.    Eyes: Negative.   Respiratory: Negative.    Cardiovascular: Negative.   Gastrointestinal: Negative.   Musculoskeletal:  Positive for joint pain.       Foot pain.  Neurological: Negative.   Psychiatric/Behavioral:  Positive for depression. Negative for hallucinations, memory loss, substance abuse and suicidal ideas. The patient is not nervous/anxious and does not have insomnia.    Past Medical History:  Diagnosis Date   Celiac disease    Diabetes mellitus without complication [facility])    Past Surgical History:  Procedure Laterality Date   AMPUTATION TOE Right 11/10/2021   Procedure: AMPUTATION TOE;  Surgeon: Felipa Furnace, DPM;  Location: [facility] ORS;  Service: Podiatry;  Laterality: Right;   NO PAST SURGERIES      reports that he has never smoked. He has never used smokeless tobacco. He reports current alcohol use. He reports current drug use. Drug: Cocaine.  No Known Allergies  History reviewed. No pertinent family history.  Prior to Admission medications   Medication Sig Start Date End Date Taking? Authorizing Provider  escitalopram (LEXAPRO) 10 MG tablet Take 10 mg by mouth daily. 11/22/21   [provider]  gabapentin (NEURONTIN) 300 MG capsule Take 1 capsule (300 mg total) by mouth 2 (two) times daily. 11/17/21   Candelaria Stagers, DPM  insulin lispro (HUMALOG) 100 UNIT/ML KwikPen Inject 0-50 Units into the skin as directed. 07/24/18   [provider]  LANTUS SOLOSTAR 100 UNIT/ML Solostar Pen Inject 50 Units into the skin at bedtime. Patient not taking: Reported on 12/11/2021 05/04/19   [provider]  levETIRAcetam (KEPPRA) 500 MG tablet Take 1 tablet (500 mg total) by mouth 2 (two) times daily. 07/05/21 12/11/21  Arnetha Courser, MD  Multiple Vitamin (MULTIVITAMIN WITH MINERALS) TABS tablet Take 1 tablet by mouth daily. 11/12/21    Sunnie Nielsen, DO  naltrexone (DEPADE) 50 MG tablet Take 1 tablet (50 mg total) by mouth daily. 12/14/21   Sunnie Nielsen, DO   Physical Exam: Vitals:   02/20/22 1920 02/20/22 1921 02/20/22 2133 02/21/22 0035  BP:  123/83 (!) 125/96 128/73  Pulse:  100 91 91  Resp:  18 19 18   Temp:  98.4 F (36.9 C)  98.5 F (36.9 C)  TempSrc:  Oral  Oral  SpO2:  98% 98% 96%  Weight: 58 kg     Height: 5\' 10"  (1.778 m)      Physical Exam Vitals and nursing note reviewed.  Constitutional:      General: He is not in acute distress.    Appearance: Normal appearance. He is not ill-appearing, toxic-appearing or diaphoretic.  HENT:     Head: Normocephalic and atraumatic.     Right Ear: Hearing and external ear normal.     Left Ear: Hearing and external ear normal.     Nose: Nose normal. No nasal deformity.     Mouth/Throat:     Lips: Pink.     Mouth: Mucous membranes are moist.     Tongue: No lesions.     Pharynx: Oropharynx is clear.  Eyes:     Extraocular Movements: Extraocular movements intact.     Pupils: Pupils are equal, round, and reactive to light.  Neck:     Vascular: No carotid bruit.  Cardiovascular:     Rate and Rhythm: Normal rate and regular rhythm.     Pulses: Normal pulses.     Heart sounds: Normal heart sounds.  Pulmonary:     Effort: Pulmonary effort is normal.     Breath sounds: Normal breath sounds.  Abdominal:     General: Bowel sounds are normal. There is no distension.     Palpations: Abdomen is soft. There is no mass.     Tenderness: There is no abdominal tenderness. There is no guarding.     Hernia: No hernia is present.  Musculoskeletal:     Right lower leg: No edema.     Left lower leg: No edema.  Skin:    General: Skin is warm.  Neurological:     General: No focal deficit present.     Mental Status: He is alert and oriented to person, place, and time.     Cranial Nerves: Cranial nerves 2-12 are intact.     Motor: Motor function is intact.   Psychiatric:        Attention and Perception: Attention normal.        Mood and Affect: Mood normal.        Speech: Speech normal.        Behavior: Behavior normal. Behavior is cooperative.        Cognition and Memory: Cognition normal.   Labs  on Admission: I have personally reviewed following labs and imaging studies.  BMET Recent Labs  Lab 02/20/22 1918  NA 136  K 4.6  CL 98  CO2 26  BUN 27*  CREATININE 0.78  GLUCOSE 457*   Electrolytes Recent Labs  Lab 02/20/22 1918  CALCIUM 9.4   Sepsis Markers No results for input(s): "LATICACIDVEN", "PROCALCITON", "O2SATVEN" in the last 168 hours. ABG No results for input(s): "PHART", "PCO2ART", "PO2ART" in the last 168 hours. Liver Enzymes No results for input(s): "AST", "ALT", "ALKPHOS", "BILITOT", "ALBUMIN" in the last 168 hours. Cardiac Enzymes No results for input(s): "TROPONINI", "PROBNP" in the last 168 hours. No results found for: "DDIMER" Coag's No results for input(s): "APTT", "INR" in the last 168 hours.  No results found for this or any previous visit (from the past 240 hour(s)).   Current Facility-Administered Medications:    0.9 %  sodium chloride infusion, , Intravenous, Continuous, Daegon Deiss V, MD   ceFEPIme (MAXIPIME) 2 g in sodium chloride 0.9 % 100 mL IVPB, 2 g, Intravenous, Q8H, Belue, Nathan S, RPH   escitalopram (LEXAPRO) tablet 10 mg, 10 mg, Oral, Daily, Allena Katz, Eliezer Mccoy, MD   gabapentin (NEURONTIN) capsule 300 mg, 300 mg, Oral, BID, Allena Katz, Eliezer Mccoy, MD   heparin injection 5,000 Units, 5,000 Units, Subcutaneous, Q8H, Adara Kittle, Eliezer Mccoy, MD   hydrALAZINE (APRESOLINE) injection 10 mg, 10 mg, Intravenous, Q6H PRN, Gertha Calkin, MD   HYDROcodone-acetaminophen (NORCO/VICODIN) 5-325 MG per tablet 1-2 tablet, 1-2 tablet, Oral, Q4H PRN, Allena Katz, Eliezer Mccoy, MD   insulin lispro (HUMALOG) KwikPen 0-50 Units, 0-50 Units, Subcutaneous, UD, Aiyla Baucom, Eliezer Mccoy, MD   levETIRAcetam (KEPPRA) tablet 500 mg, 500 mg, Oral, BID, Allena Katz, Maidie Streight  V, MD   morphine (PF) 2 MG/ML injection 2 mg, 2 mg, Intravenous, Q2H PRN, Allena Katz, Eliezer Mccoy, MD   nicotine (NICODERM CQ - dosed in mg/24 hr) patch 7 mg, 7 mg, Transdermal, Daily, Vonita Calloway V, MD   sodium chloride flush (NS) 0.9 % injection 3 mL, 3 mL, Intravenous, Q12H, Allena Katz, Eliezer Mccoy, MD   traMADol (ULTRAM) tablet 50 mg, 50 mg, Oral, Q8H PRN, Gertha Calkin, MD   vancomycin (VANCOCIN) IVPB 1000 mg/200 mL premix, 1,000 mg, Intravenous, Q12H, Belue, Nathan S, RPH   vancomycin (VANCOREADY) IVPB 1250 mg/250 mL, 1,250 mg, Intravenous, Once, Otelia Sergeant, RPH, Last Rate: 166.7 mL/hr at 02/21/22 0138, 1,250 mg at 02/21/22 0138  Current Outpatient Medications:    escitalopram (LEXAPRO) 10 MG tablet, Take 10 mg by mouth daily., Disp: , Rfl:    gabapentin (NEURONTIN) 300 MG capsule, Take 1 capsule (300 mg total) by mouth 2 (two) times daily., Disp: 60 capsule, Rfl: 0   insulin lispro (HUMALOG) 100 UNIT/ML KwikPen, Inject 0-50 Units into the skin as directed., Disp: , Rfl:    LANTUS SOLOSTAR 100 UNIT/ML Solostar Pen, Inject 50 Units into the skin at bedtime. (Patient not taking: Reported on 12/11/2021), Disp: , Rfl:    levETIRAcetam (KEPPRA) 500 MG tablet, Take 1 tablet (500 mg total) by mouth 2 (two) times daily., Disp: 60 tablet, Rfl: 0   Multiple Vitamin (MULTIVITAMIN WITH MINERALS) TABS tablet, Take 1 tablet by mouth daily., Disp: 30 tablet, Rfl: 0   naltrexone (DEPADE) 50 MG tablet, Take 1 tablet (50 mg total) by mouth daily., Disp: 30 tablet, Rfl: 0  COVID-19 Labs No results for input(s): "DDIMER", "FERRITIN", "LDH", "CRP" in the last 72 hours. Lab Results  Component Value Date   SARSCOV2NAA NEGATIVE 07/03/2021  [place] NEGATIVE 02/14/2021   SARSCOV2NAA POSITIVE (A) 05/26/2019    Radiological Exams on Admission: DG Foot Complete Right  Result Date: 02/20/2022 CLINICAL DATA:  Sore that has gotten worse on top of the right foot. EXAM: RIGHT FOOT COMPLETE - 3+ VIEW COMPARISON:  Dec 12, 2021  FINDINGS: Prior amputation of the right great toe is seen along the distal aspect of the proximal phalanx. There is no evidence of an acute fracture or dislocation. There is no evidence of arthropathy or other focal bone abnormality. Soft tissues are unremarkable. IMPRESSION: 1. Prior amputation of the right great toe. 2. No acute osseous abnormality. Electronically Signed   By: Virgina Norfolk M.D.   On: 02/20/2022 19:23    EKG: Independently reviewed.  None.  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 Official report is pending. We will continue with iv abx.  Podiatry consulted.   Abnormal MRI scan, bone Official report of OM pending.   AKI (acute kidney injury) [facility]) Lab Results  Component Value Date   CREATININE 0.78 02/20/2022   CREATININE 0.47 (L) 12/13/2021   CREATININE 0.70 12/12/2021  Resolved.    Seizure ([place]) Cont keppra.  Seizure precaution.  Depression Cont lexapro.   Substance abuse ([place]) H/o cocaine. UDS pending.  Uncontrolled type 1 diabetes mellitus with hyperglycemia, with long-term current use of insulin ([facility]) Continue lantus daily. Cont Glycemic protocol.     DVT prophylaxis:  Heparin  Code Status:  Full code  Family Communication:  Avard, Janssen (Mother)  [phone] (Mobile)   Disposition Plan:  Home  Consults called:  Podiatry: [facility]. Dr.McDonald.   Admission status: Inpatient.     Para Skeans MD [facility]  6 PM- 2 AM. Please contact me via secure Chat 6 PM-2 AM. [phone] ( Pager ) To contact the [facility] Attending or Consulting provider [place] or covering provider during after hours [place], for this patient.   Check the care team in [facility] and look for a) attending/consulting [facility] provider listed and b) the [facility] team listed Log into [email or web address] and use [place]'s universal password to access. If you do not have the password, please contact the hospital  operator. Locate the [facility] provider you are looking for under [facility] and page to a number that you can be directly reached. If you still have difficulty reaching the provider, please page the [facility] (Director on Call) for the Hospitalists listed on amion for assistance. [email or web address] 02/21/2022, 1:51 AM

## 2022-02-20 NOTE — ED Provider Notes (Signed)
[facility] Provider Note   Event Date/Time   First MD Initiated Contact with Patient 02/20/22 2051     (approximate)  History   Foot Injury  HPI  Jesus Ewing is a 21 y.o. male with a history of insulin-dependent diabetes and previous right great toe amputation  Patient reports that he tried running a couple days ago and felt a pop feeling in his right foot.  Since then has had increasing redness swelling and pain over the top of his right foot.  He is here with his mother.  Both concerned that he could have suffered an injury or infection as he has a history of an infection and osteomyelitis that has previously led to amputation   No numbness or weakness in the foot.  No other pain or injury.  Did not fall or suffer any injury.  Right foot has become more swollen and red feels slightly warm to touch.  Pain is a 6-7 out of 10  No open wounds or drainage  He is diabetic, reports he ate a high sugar meal right before he came to the ER and covered using his sliding scale with 25 units of short acting insulin just shortly before he got to the ER.  Physical Exam   Triage Vital Signs: ED Triage Vitals  Enc Vitals Group     BP 02/20/22 1921 123/83     Pulse Rate 02/20/22 1921 100     Resp 02/20/22 1921 18     Temp 02/20/22 1921 98.4 F (36.9 C)     Temp Source 02/20/22 1921 Oral     SpO2 02/20/22 1921 98 %     Weight 02/20/22 1920 127 lb 13.9 oz (58 kg)     Height 02/20/22 1920 5\' 10"  (1.778 m)     Head Circumference --      Peak Flow --      Pain Score 02/20/22 1921 7     Pain Loc --      Pain Edu? --      Excl. in GC? --     Most recent vital signs: Vitals:   02/20/22 1921 02/20/22 2133  BP: 123/83 (!) 125/96  Pulse: 100 91  Resp: 18 19  Temp: 98.4 F (36.9 C)   SpO2: 98% 98%     General: Awake, no distress.  CV:  Good peripheral perfusion.  Normal heart tones, strong palpable dorsalis pedis and posterior tibial pulses in the  right foot. Resp:  Normal effort.  Clear lung speaks in full sentences Abd:  No distention.  Other:  Right foot with previous distal right great toe amputation, clean dry intact.  Mild erythema warmth and tenderness noted across the stub of the right great toe, also up across the dorsal surface of the right foot with mild erythema tenderness and slight edema extending across the top of the foot but not noted on the bottom.  There is no open wounds or lesions.  There is no redness or streaking up the level of the ankle or up the leg further.   ED Results / Procedures / Treatments   Labs (all labs ordered are listed, but only abnormal results are displayed) Labs Reviewed  BASIC METABOLIC PANEL - Abnormal; Notable for the following components:      Result Value   Glucose, Bld 457 (*)    BUN 27 (*)    All other components within normal limits  CBC WITH DIFFERENTIAL/PLATELET - Abnormal; Notable  for the following components:   Eosinophils Absolute 0.7 (*)    All other components within normal limits  CBG MONITORING, ED - Abnormal; Notable for the following components:   Glucose-Capillary 466 (*)    All other components within normal limits  CBG MONITORING, ED - Abnormal; Notable for the following components:   Glucose-Capillary 292 (*)    All other components within normal limits  CBG MONITORING, ED - Abnormal; Notable for the following components:   Glucose-Capillary 206 (*)    All other components within normal limits  CULTURE, BLOOD (ROUTINE X 2)  CULTURE, BLOOD (ROUTINE X 2)  CBG MONITORING, ED  CBG MONITORING, ED  CBG MONITORING, ED  CBG MONITORING, ED  CBG MONITORING, ED  CBG MONITORING, ED  CBG MONITORING, ED  CBG MONITORING, ED     EKG     RADIOLOGY  Reviewed radiologist preliminary read for MRI of the right foot.  Entered as clinical media   I personally interpreted the patient's right foot x-ray is negative for acute finding.  No lytic bone lesion is noted no  gas formation.  Please note MRI however is pending  PROCEDURES:  Critical Care performed: No  Procedures   MEDICATIONS ORDERED IN ED: Medications  metroNIDAZOLE (FLAGYL) IVPB 500 mg (has no administration in time range)  vancomycin (VANCOREADY) IVPB 1250 mg/250 mL (has no administration in time range)  ceFEPIme (MAXIPIME) 2 g in sodium chloride 0.9 % 100 mL IVPB (has no administration in time range)  morphine (PF) 4 MG/ML injection 4 mg (4 mg Intravenous Given 02/20/22 2125)  sodium chloride 0.9 % bolus 1,000 mL (0 mLs Intravenous Stopped 02/20/22 2217)  gadobutrol (GADAVIST) 1 MMOL/ML injection 5 mL (5 mLs Intravenous Contrast Given 02/20/22 2244)     IMPRESSION / MDM / ASSESSMENT AND PLAN / ED COURSE  I reviewed the triage vital signs and the nursing notes.                              Differential diagnosis includes, but is not limited to, possible musculoskeletal injury, ligamentous strain or injury as its occurred with a pop while running and has steadily worsened, but now also concern for possible cellulitis, diabetic infection, osteomyelitis or other complication.  No acute neurologic or vascular abnormalities noted.  There is erythema of the foot and slight swelling.  Labs indicate no concern for DKA, he is taking his own insulin and had to cover for a large meal and his blood sugar has improved without receiving insulin here.  Normal anion gap.  He does not have a fever or elevated white count which argues somewhat against infection, but does certainly have a history of known infection in the right foot resulting in previous amputation.  Discussed with patient and his mother, we will proceed with MRI of the right foot to further delineate potential cause for his presentation which I am most concerned could represent infection but also could represent musculoskeletal type injury given the history  Patient's presentation is most consistent with acute complicated illness / injury  requiring diagnostic workup.  ----------------------------------------- 11:49 PM on 02/20/2022 ----------------------------------------- Patient reports pain well controlled at this time.  Fully alert and oriented understands my concern that he has potential recurrent infection in the right foot including possible osteomyelitis and infected joint.  Discussed with pharmacy, will start on broad-spectrum antibiotics.  I have placed consult with Dr. Lilian Kapur of [facility].  Dr. Sherryle Lis agrees to provide consultation, reviewed MRI preliminary read with him as well as clinical history, will start on broad-spectrum antibiotic awaiting podiatry consult for additional info.  Consulted with the hospitalist, after discussion patient will be admitted to the hospitalist service with podiatry consult to occur later on this morning  FINAL CLINICAL IMPRESSION(S) / ED DIAGNOSES   Final diagnoses:  Other acute osteomyelitis of right foot ([place])     Rx / DC Orders   ED Discharge Orders     None        Note:  This document was prepared using Dragon voice recognition software and may include unintentional dictation errors.   Delman Kitten, MD 02/21/22 0000

## 2022-02-20 NOTE — ED Provider Triage Note (Signed)
  Emergency Medicine Provider Triage Evaluation Note  Jesus Ewing , a 21 y.o.male,  was evaluated in triage.  Pt complains of right lower extremity pain, swelling, redness.  Reports noticing this over the past few days.  He has had a amputation of his right big toe in the past due to a reported bone infection.  He is a diabetic.   Review of Systems  Positive: Right lower extremity pain, erythema Negative: Denies fever, chest pain, vomiting  Physical Exam  There were no vitals filed for this visit. Gen:   Awake, no distress   Resp:  Normal effort  MSK:   Moves extremities without difficulty  Other:  Erythema present along the right lower extremity, from the foot extending up to the proximal tibia/fibular region.  Medical Decision Making  Given the patient's initial medical screening exam, the following diagnostic evaluation has been ordered. The patient will be placed in the appropriate treatment space, once one is available, to complete the evaluation and treatment. I have discussed the plan of care with the patient and I have advised the patient that an ED physician or mid-level practitioner will reevaluate their condition after the test results have been received, as the results may give them additional insight into the type of treatment they may need.    Diagnostics: Labs, x-ray  Treatments: none immediately   Varney Daily, [place] 02/20/22 [phone]

## 2022-02-20 NOTE — ED Triage Notes (Addendum)
Pt presents via POV with complaints of right foot pain that started 3 days ago. Pt was running and he heard and "pop" in his right foot and has redness to the top of his foot. Of note, the patient is T1DM and had his great toe on his right foot amputated in April 2023.  Pt ambulatory in triage.   CBG 466

## 2022-02-21 ENCOUNTER — Other Ambulatory Visit: Payer: Self-pay

## 2022-02-21 DIAGNOSIS — F32A Depression, unspecified: Secondary | ICD-10-CM | POA: Diagnosis not present

## 2022-02-21 DIAGNOSIS — R937 Abnormal findings on diagnostic imaging of other parts of musculoskeletal system: Secondary | ICD-10-CM | POA: Diagnosis not present

## 2022-02-21 DIAGNOSIS — M86671 Other chronic osteomyelitis, right ankle and foot: Secondary | ICD-10-CM | POA: Diagnosis not present

## 2022-02-21 DIAGNOSIS — M79671 Pain in right foot: Secondary | ICD-10-CM | POA: Diagnosis not present

## 2022-02-21 DIAGNOSIS — Z91148 Patient's other noncompliance with medication regimen for other reason: Secondary | ICD-10-CM

## 2022-02-21 DIAGNOSIS — R569 Unspecified convulsions: Secondary | ICD-10-CM

## 2022-02-21 DIAGNOSIS — F1910 Other psychoactive substance abuse, uncomplicated: Secondary | ICD-10-CM

## 2022-02-21 DIAGNOSIS — E11610 Type 2 diabetes mellitus with diabetic neuropathic arthropathy: Secondary | ICD-10-CM | POA: Diagnosis not present

## 2022-02-21 DIAGNOSIS — E1065 Type 1 diabetes mellitus with hyperglycemia: Secondary | ICD-10-CM | POA: Diagnosis present

## 2022-02-21 LAB — GLUCOSE, CAPILLARY
Glucose-Capillary: 458 mg/dL — ABNORMAL HIGH (ref 70–99)
Glucose-Capillary: 321 mg/dL — ABNORMAL HIGH (ref 70–99)
Glucose-Capillary: 362 mg/dL — ABNORMAL HIGH (ref 70–99)
Glucose-Capillary: 164 mg/dL — ABNORMAL HIGH (ref 70–99)

## 2022-02-21 LAB — GAMMA GT: GGT: 16 U/L (ref 7–50)

## 2022-02-21 LAB — URIC ACID: Uric Acid, Serum: 4.7 mg/dL (ref 3.7–8.6)

## 2022-02-21 LAB — HEMOGLOBIN A1C
Hgb A1c MFr Bld: 10.2 % — ABNORMAL HIGH (ref 4.8–5.6)
Mean Plasma Glucose: 246.04 mg/dL

## 2022-02-21 LAB — COMPREHENSIVE METABOLIC PANEL
ALT: 27 U/L (ref 0–44)
AST: 18 U/L (ref 15–41)
Albumin: 3.1 g/dL — ABNORMAL LOW (ref 3.5–5.0)
Alkaline Phosphatase: 94 U/L (ref 38–126)
Anion gap: 12 (ref 5–15)
BUN: 29 mg/dL — ABNORMAL HIGH (ref 6–20)
CO2: 19 mmol/L — ABNORMAL LOW (ref 22–32)
Calcium: 8.1 mg/dL — ABNORMAL LOW (ref 8.9–10.3)
Chloride: 99 mmol/L (ref 98–111)
Creatinine, Ser: 0.74 mg/dL (ref 0.61–1.24)
GFR, Estimated: >60 mL/min (ref >60)
Glucose, Bld: 549 mg/dL (ref 70–99)
Potassium: 4.8 mmol/L (ref 3.5–5.1)
Sodium: 130 mmol/L — ABNORMAL LOW (ref 135–145)
Total Bilirubin: 1.6 mg/dL — ABNORMAL HIGH (ref 0.3–1.2)
Total Protein: 5.9 g/dL — ABNORMAL LOW (ref 6.5–8.1)

## 2022-02-21 LAB — CBC
HCT: 42.3 % (ref 39.0–52.0)
Hemoglobin: 14.7 g/dL (ref 13.0–17.0)
MCH: 32.5 pg (ref 26.0–34.0)
MCHC: 34.8 g/dL (ref 30.0–36.0)
MCV: 93.6 fL (ref 80.0–100.0)
Platelets: 263 10*3/uL (ref 150–400)
RBC: 4.52 MIL/uL (ref 4.22–5.81)
RDW: 11.5 % (ref 11.5–15.5)
WBC: 7.4 10*3/uL (ref 4.0–10.5)
nRBC: 0 % (ref 0.0–0.2)

## 2022-02-21 LAB — SEDIMENTATION RATE: Sed Rate: 7 mm/hr (ref 0–15)

## 2022-02-21 LAB — CBG MONITORING, ED: Glucose-Capillary: 473 mg/dL — ABNORMAL HIGH (ref 70–99)

## 2022-02-21 LAB — PREALBUMIN: Prealbumin: 21 mg/dL (ref 18–38)

## 2022-02-21 LAB — HIV ANTIBODY (ROUTINE TESTING W REFLEX): HIV Screen 4th Generation wRfx: NONREACTIVE

## 2022-02-21 LAB — PHOSPHORUS: Phosphorus: 4.7 mg/dL — ABNORMAL HIGH (ref 2.5–4.6)

## 2022-02-21 LAB — ETHANOL: Alcohol, Ethyl (B): <10 mg/dL (ref <10)

## 2022-02-21 LAB — MAGNESIUM: Magnesium: 2 mg/dL (ref 1.7–2.4)

## 2022-02-21 MED ORDER — INSULIN ASPART 100 UNIT/ML IJ SOLN: 0.0000 [IU] | Freq: Every day | INTRAMUSCULAR | Status: DC

## 2022-02-21 MED ORDER — MORPHINE SULFATE (PF) 2 MG/ML IV SOLN: 2.0000 mg | INTRAVENOUS | Status: DC | PRN

## 2022-02-21 MED ORDER — TRAMADOL HCL 50 MG PO TABS: 50.0000 mg | ORAL_TABLET | Freq: Three times a day (TID) | ORAL | Status: DC | PRN

## 2022-02-21 MED ORDER — HYDROCODONE-ACETAMINOPHEN 5-325 MG PO TABS
1.0000 | ORAL_TABLET | ORAL | Status: DC | PRN
  Administered 2022-02-21: 2 via ORAL
  Filled 2022-02-21: qty 2

## 2022-02-21 MED ORDER — HYDRALAZINE HCL 20 MG/ML IJ SOLN: 10.0000 mg | Freq: Four times a day (QID) | INTRAMUSCULAR | Status: DC | PRN

## 2022-02-21 MED ORDER — NICOTINE 7 MG/24HR TD PT24
7.0000 mg | MEDICATED_PATCH | Freq: Every day | TRANSDERMAL | Status: DC
  Filled 2022-02-21: qty 1

## 2022-02-21 MED ORDER — INSULIN ASPART 100 UNIT/ML IJ SOLN: 3.0000 [IU] | Freq: Three times a day (TID) | INTRAMUSCULAR | Status: DC

## 2022-02-21 MED ORDER — SODIUM CHLORIDE 0.9% FLUSH
3.0000 mL | Freq: Two times a day (BID) | INTRAVENOUS | Status: DC
  Administered 2022-02-21 (×2): 3 mL via INTRAVENOUS

## 2022-02-21 MED ORDER — VANCOMYCIN HCL IN DEXTROSE 1-5 GM/200ML-% IV SOLN
1000.0000 mg | Freq: Two times a day (BID) | INTRAVENOUS | Status: DC
  Filled 2022-02-21: qty 200

## 2022-02-21 MED ORDER — SODIUM CHLORIDE 0.9 % IV SOLN: INTRAVENOUS | Status: DC

## 2022-02-21 MED ORDER — INSULIN LISPRO (1 UNIT DIAL) 100 UNIT/ML (KWIKPEN): 0.0000 [IU] | PEN_INJECTOR | SUBCUTANEOUS | Status: DC

## 2022-02-21 MED ORDER — INSULIN DETEMIR 100 UNIT/ML ~~LOC~~ SOLN
28.0000 [IU] | Freq: Once | SUBCUTANEOUS | Status: AC
  Administered 2022-02-21: 28 [IU] via SUBCUTANEOUS
  Filled 2022-02-21: qty 0.28

## 2022-02-21 MED ORDER — INSULIN ASPART 100 UNIT/ML IJ SOLN
20.0000 [IU] | Freq: Three times a day (TID) | INTRAMUSCULAR | Status: DC
  Administered 2022-02-21 (×2): 20 [IU] via SUBCUTANEOUS
  Filled 2022-02-21 (×2): qty 1

## 2022-02-21 MED ORDER — NICOTINE 7 MG/24HR TD PT24: 7.0000 mg | MEDICATED_PATCH | Freq: Every day | TRANSDERMAL | 0 refills | Status: DC

## 2022-02-21 MED ORDER — INSULIN DETEMIR 100 UNIT/ML ~~LOC~~ SOLN: 28.0000 [IU] | Freq: Two times a day (BID) | SUBCUTANEOUS | Status: DC

## 2022-02-21 MED ORDER — INSULIN ASPART 100 UNIT/ML IJ SOLN
0.0000 [IU] | Freq: Three times a day (TID) | INTRAMUSCULAR | Status: DC
  Administered 2022-02-21 (×2): 9 [IU] via SUBCUTANEOUS
  Administered 2022-02-21: 2 [IU] via SUBCUTANEOUS
  Filled 2022-02-21 (×3): qty 1

## 2022-02-21 MED ORDER — LEVETIRACETAM 500 MG PO TABS
500.0000 mg | ORAL_TABLET | Freq: Two times a day (BID) | ORAL | Status: DC
  Administered 2022-02-21: 500 mg via ORAL
  Filled 2022-02-21 (×2): qty 1

## 2022-02-21 MED ORDER — TRAMADOL HCL 50 MG PO TABS: 100.0000 mg | ORAL_TABLET | Freq: Three times a day (TID) | ORAL | Status: DC | PRN

## 2022-02-21 MED ORDER — HEPARIN SODIUM (PORCINE) 5000 UNIT/ML IJ SOLN
5000.0000 [IU] | Freq: Three times a day (TID) | INTRAMUSCULAR | Status: DC
  Administered 2022-02-21: 5000 [IU] via SUBCUTANEOUS
  Filled 2022-02-21 (×3): qty 1

## 2022-02-21 MED ORDER — INSULIN GLARGINE-YFGN 100 UNIT/ML ~~LOC~~ SOLN
40.0000 [IU] | Freq: Every day | SUBCUTANEOUS | Status: DC
  Filled 2022-02-21: qty 0.4

## 2022-02-21 MED ORDER — SODIUM CHLORIDE 0.9 % IV SOLN
2.0000 g | Freq: Three times a day (TID) | INTRAVENOUS | Status: DC
  Administered 2022-02-21: 2 g via INTRAVENOUS
  Filled 2022-02-21 (×2): qty 12.5

## 2022-02-21 MED ORDER — ESCITALOPRAM OXALATE 10 MG PO TABS: 10.0000 mg | ORAL_TABLET | Freq: Every day | ORAL | Status: DC

## 2022-02-21 MED ORDER — INSULIN ASPART 100 UNIT/ML IJ SOLN
3.0000 [IU] | Freq: Once | INTRAMUSCULAR | Status: AC
  Administered 2022-02-21: 3 [IU] via SUBCUTANEOUS
  Filled 2022-02-21: qty 1

## 2022-02-21 MED ORDER — GABAPENTIN 300 MG PO CAPS: 300.0000 mg | ORAL_CAPSULE | Freq: Two times a day (BID) | ORAL | Status: DC

## 2022-02-21 MED ORDER — SODIUM CHLORIDE 0.9 % IV BOLUS
1000.0000 mL | Freq: Once | INTRAVENOUS | Status: AC
  Administered 2022-02-21: 1000 mL via INTRAVENOUS

## 2022-02-21 NOTE — Assessment & Plan Note (Signed)
Continue lantus daily. Cont Glycemic protocol.

## 2022-02-21 NOTE — Progress Notes (Signed)
Pharmacy Antibiotic Note  Jesus Ewing is a 21 y.o. male admitted on 02/20/2022 with osteomyelitis.  Pharmacy has been consulted for Cefepime & Vancomycin dosing.  Plan: Cefepime 2 gm q8h per indication & renal fxn  Pt given Vancomycin 1250 mg once. Vancomycin 1000 mg IV Q 12 hrs. Goal AUC 400-550. Expected AUC: 457.5 SCr used: 0.78, TBW 58 kg < IBW 73 kg  Pharmacy will continue to follow and will adjust abx dosing whenever warranted.  Height: 5\' 10"  (177.8 cm) Weight: 58 kg (127 lb 13.9 oz) IBW/kg (Calculated) : 73  Temp (24hrs), Avg:98.5 F (36.9 C), Min:98.4 F (36.9 C), Max:98.5 F (36.9 C)   Recent Labs  Lab 02/20/22 1918  WBC 6.2  CREATININE 0.78    Estimated Creatinine Clearance: 117.8 mL/min (by C-G formula based on SCr of 0.78 mg/dL).    No Known Allergies  Antimicrobials this admission: 8/07 Flagyl >> x 1 dose 8/07 Cefepime >>  8/07 Vancomycin >>   Microbiology results: 8/07 BCx: Pending  Thank you for allowing pharmacy to be a part of this patient's care.  10/07, PharmD, MBA 02/21/2022 1:22 AM

## 2022-02-21 NOTE — Progress Notes (Signed)
Discharge process complete with patient and mother at bedside. PIV removed and personal items gathered and taken with patient. Vitals stable. Patient refused wheelchair transport to medical mall. Patient off unit. Care relinquished.

## 2022-02-21 NOTE — Assessment & Plan Note (Signed)
Official report of OM pending.

## 2022-02-21 NOTE — Consult Note (Addendum)
PODIATRY CONSULTATION  NAME Jesus Ewing MRN [id number] DOB 2001/02/13 DOA 02/20/2022   Reason for consult:  Chief Complaint  Patient presents with   Foot Injury    Consulting physician: Irena Cords MD.  [facility].  History of present illness: 21 y.o. male PMHx DM type I, prior surgical history of osteomyelitis with partial toe amputation to the right hallux admitted to the hospital for worsening pain and tenderness to the right foot and ankle.  Concern for possible osteomyelitis.  Presents this a.m. resting comfortably in bed.  Past Medical History:  Diagnosis Date   Celiac disease    Diabetes mellitus without complication (HCC)        Latest Ref Rng & Units 02/21/2022    4:58 AM 02/20/2022    7:18 PM 12/13/2021    8:56 AM  CBC  WBC 4.0 - 10.5 K/uL 7.4  6.2  4.4   Hemoglobin 13.0 - 17.0 g/dL 62.2  29.7  98.9   Hematocrit 39.0 - 52.0 % 42.3  47.0  35.6   Platelets 150 - 400 K/uL 263  317  227        Latest Ref Rng & Units 02/21/2022    4:58 AM 02/20/2022    7:18 PM 12/13/2021    2:55 PM  BMP  Glucose 70 - 99 mg/dL 211  941    BUN 6 - 20 mg/dL 29  27    Creatinine 7.40 - 1.24 mg/dL 8.14  4.81    Sodium 856 - 145 mmol/L 130  136    Potassium 3.5 - 5.1 mmol/L 4.8  4.6  4.6   Chloride 98 - 111 mmol/L 99  98    CO2 22 - 32 mmol/L 19  26    Calcium 8.9 - 10.3 mg/dL 8.1  9.4         Physical Exam: General: The patient is alert and oriented x3 in no acute distress.   Dermatology: Skin is warm, dry and supple bilateral lower extremities.  No open wounds noted.  Skin is healthy and viable.  The partial toe amputation has healed nicely.  Vascular: Palpable pedal pulses bilaterally.  There is no warmth compared to the contralateral limb.  Very mild edema noted throughout the foot and ankle  Neurological: Epicritic and protective threshold diminished bilaterally.   Musculoskeletal Exam: History of partial hallux amputation RT foot, otherwise no structural  deformity noted.  Pain with light touch throughout the entire foot and ankle  XR FOOT RT 02/20/2022 inpatient: IMPRESSION: 1. Prior amputation of the right great toe. 2. No acute osseous abnormality.  MR FOOT RT W/WO CONTRAST 02/20/2022: IMPRESSION: 1. Acute nondisplaced fractures of the base of the first metatarsal and base of the great toe proximal phalanx. 2. Bone marrow edema within the second and third metatarsal head and necks also suggest stress changes/developing fractures. 3. Additional sites of bone marrow edema involves the proximal, middle, and distal phalanx of the second toe, the medial and intermediate cuneiforms, as well as the navicular and talar head/neck which are seen at the edge of the field of view. Each of the above findings are favored to represent changes related to stress and altered mechanics. No definite osteomyelitis at this time. 4. Post amputation changes of the flexor tendon of the great toe with mild tenosynovitis.  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  osteomyelitis. -No surgery or surgical intervention warranted at this time -Recommend weightbearing as tolerated in the cam boot with the assistance of crutches.  Order placed for crutches and Cam boot -Okay to discharge from a podiatry standpoint.  Recommend follow-up in office outpatient -Podiatry to sign off  Thank you for the consult.     Felecia Shelling, DPM [facility]  Dr. Felecia Shelling, DPM    [address] [place], [place] [address]                Office [phone]  Fax [phone]

## 2022-02-21 NOTE — Assessment & Plan Note (Signed)
Lab Results  Component Value Date   CREATININE 0.78 02/20/2022   CREATININE 0.47 (L) 12/13/2021   CREATININE 0.70 12/12/2021  Resolved.

## 2022-02-21 NOTE — Assessment & Plan Note (Signed)
Abnormal Mri for OM of rt 1st MTP. Official report is pending. We will continue with iv abx.  Podiatry consulted.

## 2022-02-21 NOTE — Progress Notes (Addendum)
Discahrge placed by MD. I went in to do discharge education. Pt mother stated hse had not spoken to a MD about d/c today nor has she seen a podiatrist. Pt mother and pt is refusing to leave without speaking to MD. MD secure chatted multiple time at 1623. Manger Leavy Cella) a nd Press photographer Bre added to ,message and made aware. Unit secretory then paged Md over phone. MD called back approx. [phone] and stated that was fine for pt to refused and reevaluate in the morning. Will continue to monitor

## 2022-02-21 NOTE — Discharge Summary (Signed)
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 He presents to the hospital for right foot pain after feeling a pop while running.   The ED he was started on IV antibiotics for possible recurrent osteomyelitis.  He underwent an MRI of the right foot: Acute nondisplaced fractures of the base of the first metatarsal and base of the great toe proximal phalanx. 2. Bone marrow edema within the second and third metatarsal head and necks also suggest stress changes/developing fractures.  Principal Problem:   Right foot pain -MRI is negative for osteomyelitis, I have discontinued antibiotics -MRI does show fracture of metatarsal and proximal phalanx of the left first toe - The patient's foot is quite tender and swollen-swelling is specifically in the dorsum of the foot but tenderness is throughout the foot - Podiatry, Dr. Logan Bores has evaluated the patient and notes that this is likely the beginning of Charcot arthropathy-Cam walker and crutches have been ordered been ambulating to the bathroom and has not used any pain medications  Active  Problems:      Seizure (HCC) - Continue Keppra    Depression - Continue Lexapro    Substance abuse (HCC) -Tree of cocaine use  Uncontrolled type 1 diabetes mellitus with hyperglycemia, with long-term current use of insulin (HCC) -A1c is 10.2 - I have discussed with the patient that he needs better control of his diabetes - he states he eats large amounts of food and despite taking his insulin has sugars that run in the 200s - His mother states that he is noncompliant with insulin although the patient states that he is compliant       Discharge Instructions  Discharge Instructions     Diet - low sodium heart healthy   Complete by: As directed    Diet Carb Modified   Complete by: As directed    Increase activity slowly   Complete by: As directed       Allergies as of 02/21/2022   No Known Allergies      Medication List     TAKE these medications    escitalopram 10 MG tablet Commonly known as: LEXAPRO Take 10 mg by mouth daily.   gabapentin 300 MG capsule Commonly known as: NEURONTIN Take 1 capsule (300 mg total) by mouth 2 (two) times daily.   insulin lispro 100 UNIT/ML KwikPen Commonly known as: HUMALOG Inject 0-50 Units into the skin as directed.   Lantus SoloStar 100 UNIT/ML Solostar Pen Generic drug: insulin glargine Inject 55 Units into the skin at bedtime.   levETIRAcetam 500 MG tablet Commonly known as: KEPPRA Take 1 tablet (500 mg total) by mouth 2 (two) times daily.   multivitamin with minerals Tabs  tablet Take 1 tablet by mouth daily.   naltrexone 50 MG tablet Commonly known as: DEPADE Take 1 tablet (50 mg total) by mouth daily.   nicotine 7 mg/24hr patch Commonly known as: NICODERM CQ - dosed in mg/24 hr Place 1 patch (7 mg total) onto the skin daily. Start taking on: February 22, 2022               Durable Medical Equipment  (From admission, onward)           Start     Ordered   02/21/22 0913  For home use only DME  Crutches  Once        02/21/22 0912                The results of significant diagnostics from this hospitalization (including imaging, microbiology, ancillary and laboratory) are listed below for reference.    MR FOOT RIGHT W WO CONTRAST  Result Date: 02/21/2022 CLINICAL DATA:  Right foot swelling, diabetes EXAM: MRI OF THE RIGHT FOREFOOT WITHOUT AND WITH CONTRAST TECHNIQUE: Multiplanar, multisequence MR imaging of the right forefoot was performed before and after the administration of intravenous contrast. CONTRAST:  46mL GADAVIST GADOBUTROL 1 MMOL/ML IV SOLN COMPARISON:  X-ray 02/20/2022, MRI 12/12/2021 FINDINGS: Bones/Joint/Cartilage Linear T1 hypointense nondisplaced fracture line involving the base and proximal metaphysis of the first metatarsal oriented parallel to the first TMT joint with surrounding bone marrow edema (series 12, image 7). Similar nondisplaced fracture line involving the base of the great toe proximal phalanx (series 12, image 9). Suspect developing fractures involving the bases of the second and third metatarsals (series 10, images 8-9). Bone marrow edema within the second and third metatarsal head and necks also suggest stress changes/developing fractures (series 10, image 11). Additional sites of bone marrow edema involves the proximal, middle, and distal phalanx of the second toe, the medial and intermediate cuneiforms, as well as the navicular and talar head/neck which are seen at the edge of the field of view. Patient is status post great toe amputation through the proximal phalanx. Minimal marrow edema along the resection margin is less prominent compared to the previous MRI and favored to represent evolving surgical change. No focal erosion or area of marrow replacement. No malalignment. Ligaments Intact Lisfranc ligament.  Intact collateral ligaments. Muscles and Tendons Post amputation changes of the flexor tendon of the great toe with mild tenosynovitis. Remaining  flexor and extensor tendons are within normal limits. Soft tissues No discernible ulceration. No soft tissue edema or fluid collection. IMPRESSION: 1. Acute nondisplaced fractures of the base of the first metatarsal and base of the great toe proximal phalanx. 2. Bone marrow edema within the second and third metatarsal head and necks also suggest stress changes/developing fractures. 3. Additional sites of bone marrow edema involves the proximal, middle, and distal phalanx of the second toe, the medial and intermediate cuneiforms, as well as the navicular and talar head/neck which are seen at the edge of the field of view. Each of the above findings are favored to represent changes related to stress and altered mechanics. No definite osteomyelitis at this time. 4. Post amputation changes of the flexor tendon of the great toe with mild tenosynovitis. These results will be called to the ordering clinician or representative by the Radiologist Assistant, and communication documented in the PACS or [employer]. Electronically Signed   By: Duanne Guess D.O.   On: 02/21/2022 08:15   DG Foot Complete Right  Result Date: 02/20/2022  CLINICAL DATA:  Sore that has gotten worse on top of the right foot. EXAM: RIGHT FOOT COMPLETE - 3+ VIEW COMPARISON:  Dec 12, 2021 FINDINGS: Prior amputation of the right great toe is seen along the distal aspect of the proximal phalanx. There is no evidence of an acute fracture or dislocation. There is no evidence of arthropathy or other focal bone abnormality. Soft tissues are unremarkable. IMPRESSION: 1. Prior amputation of the right great toe. 2. No acute osseous abnormality. Electronically Signed   By: Aram Candela M.D.   On: 02/20/2022 19:23   Labs:   Basic Metabolic Panel: Recent Labs  Lab 02/20/22 1918 02/21/22 0458  NA 136 130*  K 4.6 4.8  CL 98 99  CO2 26 19*  GLUCOSE 457* 549*  BUN 27* 29*  CREATININE 0.78 0.74  CALCIUM 9.4 8.1*  MG  --  2.0  PHOS  --   4.7*     CBC: Recent Labs  Lab 02/20/22 1918 02/21/22 0458  WBC 6.2 7.4  NEUTROABS 2.7  --   HGB 16.5 14.7  HCT 47.0 42.3  MCV 94.0 93.6  PLT 317 263         SIGNED:   Calvert Cantor, MD  [facility] 02/21/2022, 3:42 PM

## 2022-02-21 NOTE — Assessment & Plan Note (Signed)
H/o cocaine. UDS pending.

## 2022-02-21 NOTE — ED Provider Notes (Signed)
Dr. Renaldo Reel, admitting hospitalist   Sharyn Creamer, MD 02/21/22 0001

## 2022-02-21 NOTE — Progress Notes (Signed)
Met with the patient and his mother in the room He stated that he lives with room mates He has transportation His mother will pick him up at DC He does not qualify for [facility] due to not being Home bound, he stated that he does not need PT He works and has Ins He has no TOC needs at this time

## 2022-02-21 NOTE — Assessment & Plan Note (Signed)
Cont lexapro 

## 2022-02-21 NOTE — Progress Notes (Signed)
       CROSS COVER NOTE  NAME: Jesus Ewing MRN: [id number] DOB : Sep 16, 2000    Date of Service   02/21/22  HPI/Events of Note   Notified of elevated CBG--> 458  Interventions   Plan: 12U of Novolog     This document was prepared using Dragon voice recognition software and may include unintentional dictation errors.  Bishop Limbo DNP, MHA, FNP-BC Nurse Practitioner [facility] Pager [phone]

## 2022-02-21 NOTE — Progress Notes (Signed)
Notified NP on duty on patient's latest CBG 549 mg/dL, Sodium is 765.

## 2022-02-21 NOTE — Assessment & Plan Note (Signed)
Cont keppra.  Seizure precaution.

## 2022-02-21 NOTE — Inpatient Diabetes Management (Signed)
Inpatient Diabetes Program Recommendations  AACE/ADA: New Consensus Statement on Inpatient Glycemic Control (2015)  Target Ranges:  Prepandial:   less than 140 mg/dL      Peak postprandial:   less than 180 mg/dL (1-2 hours)      Critically ill patients:  140 - 180 mg/dL   Lab Results  Component Value Date   GLUCAP 321 (H) 02/21/2022   HGBA1C 9.7 (H) 11/09/2021    Latest Reference Range & Units 02/21/22 04:58  Potassium 3.5 - 5.1 mmol/L 4.8  Chloride 98 - 111 mmol/L 99  CO2 22 - 32 mmol/L 19 (L)  Glucose 70 - 99 mg/dL 676 (HH)  BUN 6 - 20 mg/dL 29 (H)  Creatinine 1.95 - 1.24 mg/dL 0.93  Calcium 8.9 - 26.7 mg/dL 8.1 (L)  Anion gap 5 - 15  12    Latest Reference Range & Units 02/20/22 19:23 02/20/22 21:03 02/20/22 23:10 02/21/22 01:29 02/21/22 06:45 02/21/22 08:20  Glucose-Capillary 70 - 99 mg/dL 124 (H) 580 (H) 998 (H) 473 (H) 458 (H) 321 (H)  (H): Data is abnormally high  Pending A1c  Diabetes history: DM1 (requires basal, meal coverage + correction) Outpatient Diabetes medications: Lantus 50 units QHS                             Humalog 0-50 units as directed                             Dexcom G6 CGM Current orders for Inpatient glycemic control: Levemir 28 units bid, Novolog 0-9 units tid, 0-5 units hs  Inpatient Diabetes Program Recommendations:   Please consider: Novolog 0-9 units correction q 4 hrs. While NPO -Add Novolog 3 units tid meal coverage when eating if eats 50%  Patient well known to DM coordinator team. 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 Collene Schlichter with [facility] Last seen 10/05/2021 Pt was instructed to take:  Lantus 45 units QHS Humalog 1 unit for every 8 grams Carbohydrates Humalog 1 unit for every 50 >150 mg/dl  Thank you, Jesus Ewing.  Jesus Oriol, RN, MSN, CDE  Diabetes Coordinator Inpatient Glycemic Control Team Team Pager [phone] (8am-5pm) 02/21/2022 8:32 AM

## 2022-02-22 ENCOUNTER — Telehealth: Payer: Self-pay | Admitting: Podiatry

## 2022-02-22 LAB — HIGH SENSITIVITY CRP: CRP, High Sensitivity: 1.13 mg/L (ref 0.00–3.00)

## 2022-02-22 NOTE — Telephone Encounter (Signed)
Patient's mother was seen in the office today for an unrelated issue with her other child.  She let me know that Jesus Ewing was discharged yesterday without the boot and was disappointed about this.  I gave her a prescription to take to him to get one from [facility].  A safety zone portal report was created for this.  I confirmed that the order was correctly placed he was discharged without it  Sharl Ma, DPM 02/22/2022

## 2022-02-25 LAB — CULTURE, BLOOD (ROUTINE X 2)
Culture: NO GROWTH
Culture: NO GROWTH
Special Requests: ADEQUATE
Special Requests: ADEQUATE

## 2022-03-01 ENCOUNTER — Ambulatory Visit: Payer: 59 | Admitting: Podiatry

## 2022-03-01 ENCOUNTER — Ambulatory Visit (INDEPENDENT_AMBULATORY_CARE_PROVIDER_SITE_OTHER): Payer: 59

## 2022-03-01 DIAGNOSIS — E1165 Type 2 diabetes mellitus with hyperglycemia: Secondary | ICD-10-CM

## 2022-03-01 DIAGNOSIS — M14671 Charcot's joint, right ankle and foot: Secondary | ICD-10-CM | POA: Diagnosis not present

## 2022-03-01 DIAGNOSIS — Z89419 Acquired absence of unspecified great toe: Secondary | ICD-10-CM

## 2022-03-01 DIAGNOSIS — L03031 Cellulitis of right toe: Secondary | ICD-10-CM | POA: Diagnosis not present

## 2022-03-01 DIAGNOSIS — L02611 Cutaneous abscess of right foot: Secondary | ICD-10-CM

## 2022-03-01 DIAGNOSIS — M792 Neuralgia and neuritis, unspecified: Secondary | ICD-10-CM | POA: Diagnosis not present

## 2022-03-01 DIAGNOSIS — G8929 Other chronic pain: Secondary | ICD-10-CM

## 2022-03-02 NOTE — Progress Notes (Signed)
  Subjective:  Patient ID: Jesus Ewing, male    DOB: 11-14-2000,  MRN: [id number]  Chief Complaint  Patient presents with   Diabetes    Status post amputation right great toe    21 y.o. male presents with the above complaint. History confirmed with patient.  He is here for follow-up after his hospital admission.  Says he feels better being in the boot which she has today.  Objective:  Physical Exam: warm, good capillary refill, no trophic changes or ulcerative lesions, normal DP and PT pulses, and abnormal sensory exam, loss of protective sensation, he has neuropathic pain and paresthesias, there is edema and tenderness in the midfoot of the right foot, no gross deformity no rocker-bottom and no ulceration.   Radiographs: Multiple views x-ray of the right foot: New radiographs taken today show good stability with no increasing deformity no rocker-bottom no fragmentation of midfoot, soft tissue swelling present.   Assessment:   1. Charcot's joint of right foot   2. History of amputation of great toe (HCC)   3. Severe hyperglycemia due to diabetes mellitus (HCC)   4. Chronic neuropathic pain      Plan:  Patient was evaluated and treated and all questions answered.  We reviewed today's radiographs.  We also discussed in detail what Charcot neuroarthropathy is, the treatment options for it and that this will likely be a lifelong issue for him.  I recommend he remain in the cam boot, currently he seems to be in stage 0 with inflammation of the midfoot without fragmentation or fracture.  We discussed how this relates to his neuropathy and his type 2 diabetes which is severely uncontrolled.  I discussed the impact of this on his foot health as well as his overall health.  He is at high risk of kidney failure, retinal problems, foot ulcerations, infections and amputation.  I recommend he discuss with his PCP regarding a pump and/or CGM.  I would like to see him back in 4 weeks for new  x-rays.  Likely will need to transition to long-term brace at some point.  Currently no indications for casting or surgery beyond what we are currently doing  Return in about 4 weeks (around 03/29/2022) for follow up on fracture (new xrays).

## 2022-03-29 ENCOUNTER — Ambulatory Visit (INDEPENDENT_AMBULATORY_CARE_PROVIDER_SITE_OTHER): Payer: 59

## 2022-03-29 ENCOUNTER — Ambulatory Visit (INDEPENDENT_AMBULATORY_CARE_PROVIDER_SITE_OTHER): Payer: 59 | Admitting: Podiatry

## 2022-03-29 DIAGNOSIS — M792 Neuralgia and neuritis, unspecified: Secondary | ICD-10-CM

## 2022-03-29 DIAGNOSIS — G8929 Other chronic pain: Secondary | ICD-10-CM | POA: Diagnosis not present

## 2022-03-29 DIAGNOSIS — M14671 Charcot's joint, right ankle and foot: Secondary | ICD-10-CM

## 2022-03-29 DIAGNOSIS — Z89419 Acquired absence of unspecified great toe: Secondary | ICD-10-CM

## 2022-03-29 NOTE — Progress Notes (Signed)
  Subjective:  Patient ID: Jesus Ewing, male    DOB: 12-Dec-2000,  MRN: [id number]  Chief Complaint  Patient presents with   Diabetes    Charcot's joint of right foot, new xrays    21 y.o. male presents with the above complaint. History confirmed with patient.  Swelling in the foot has come down, he is still having significant pain in the toe where it was amputated as well has the toes in the left foot beginning to bother him as well  Objective:  Physical Exam: warm, good capillary refill, no trophic changes or ulcerative lesions, normal DP and PT pulses, and abnormal sensory exam, loss of protective sensation, he has neuropathic pain and paresthesias, there is no edema or pain in the midfoot of the right foot, no gross deformity no rocker-bottom and no ulceration.   Radiographs: Multiple views x-ray of the right foot: New radiographs taken today show good stability with no increasing deformity no rocker-bottom no fragmentation of midfoot, soft tissue edema has improved on radiographs as well Assessment:   1. Charcot's joint of right foot   2. History of amputation of great toe (HCC)   3. Chronic neuropathic pain      Plan:  Patient was evaluated and treated and all questions answered.  Radiographs appear to be stable and Charcot appears to be quiescent at this point.  I recommended he continue WBAT in the cam boot for 1 more month for complete consolidation and healing of the stress fracture of the metatarsal.  I think long-term he is going to need a AFO brace and a prescription for an [place] AFO was written for him to get [facility].  Most of his pain appears to be neuropathic in nature I think this is going to be a long-term chronic issue for him.  I discussed with him that the importance of reducing his A1c that may improve this.  I do think he will need chronic pain management.  A referral for this to the pain clinic was sent  Return in about 1 month (around  04/28/2022) for new right foot xrays .

## 2022-03-29 NOTE — Patient Instructions (Signed)
[facility]:  [phone]

## 2022-04-20 ENCOUNTER — Inpatient Hospital Stay
Admission: EM | Admit: 2022-04-20 | Discharge: 2022-04-21 | DRG: 639 | Discharge status: Against Medical Advice | Payer: 59 | Attending: Internal Medicine | Admitting: Internal Medicine

## 2022-04-20 ENCOUNTER — Encounter: Payer: Self-pay | Admitting: *Deleted

## 2022-04-20 ENCOUNTER — Other Ambulatory Visit: Payer: Self-pay

## 2022-04-20 DIAGNOSIS — G40909 Epilepsy, unspecified, not intractable, without status epilepticus: Secondary | ICD-10-CM | POA: Diagnosis present

## 2022-04-20 DIAGNOSIS — E1010 Type 1 diabetes mellitus with ketoacidosis without coma: Secondary | ICD-10-CM | POA: Diagnosis present

## 2022-04-20 DIAGNOSIS — Z79899 Other long term (current) drug therapy: Secondary | ICD-10-CM

## 2022-04-20 DIAGNOSIS — Z794 Long term (current) use of insulin: Secondary | ICD-10-CM | POA: Diagnosis not present

## 2022-04-20 DIAGNOSIS — E1040 Type 1 diabetes mellitus with diabetic neuropathy, unspecified: Secondary | ICD-10-CM | POA: Diagnosis present

## 2022-04-20 DIAGNOSIS — E10610 Type 1 diabetes mellitus with diabetic neuropathic arthropathy: Secondary | ICD-10-CM | POA: Diagnosis present

## 2022-04-20 DIAGNOSIS — E11610 Type 2 diabetes mellitus with diabetic neuropathic arthropathy: Secondary | ICD-10-CM | POA: Diagnosis present

## 2022-04-20 DIAGNOSIS — Z89411 Acquired absence of right great toe: Secondary | ICD-10-CM

## 2022-04-20 DIAGNOSIS — F1410 Cocaine abuse, uncomplicated: Secondary | ICD-10-CM | POA: Diagnosis present

## 2022-04-20 DIAGNOSIS — E1110 Type 2 diabetes mellitus with ketoacidosis without coma: Secondary | ICD-10-CM | POA: Diagnosis present

## 2022-04-20 DIAGNOSIS — F32A Depression, unspecified: Secondary | ICD-10-CM | POA: Diagnosis present

## 2022-04-20 DIAGNOSIS — F419 Anxiety disorder, unspecified: Secondary | ICD-10-CM | POA: Diagnosis present

## 2022-04-20 DIAGNOSIS — Z5329 Procedure and treatment not carried out because of patient's decision for other reasons: Secondary | ICD-10-CM | POA: Diagnosis present

## 2022-04-20 DIAGNOSIS — K900 Celiac disease: Secondary | ICD-10-CM | POA: Diagnosis present

## 2022-04-20 DIAGNOSIS — F1911 Other psychoactive substance abuse, in remission: Secondary | ICD-10-CM

## 2022-04-20 LAB — BASIC METABOLIC PANEL
Anion gap: 20 — ABNORMAL HIGH (ref 5–15)
BUN: 22 mg/dL — ABNORMAL HIGH (ref 6–20)
CO2: 15 mmol/L — ABNORMAL LOW (ref 22–32)
Calcium: 9.5 mg/dL (ref 8.9–10.3)
Chloride: 101 mmol/L (ref 98–111)
Creatinine, Ser: 0.95 mg/dL (ref 0.61–1.24)
GFR, Estimated: >60 mL/min (ref >60)
Glucose, Bld: 107 mg/dL — ABNORMAL HIGH (ref 70–99)
Potassium: 4 mmol/L (ref 3.5–5.1)
Sodium: 136 mmol/L (ref 135–145)

## 2022-04-20 LAB — BLOOD GAS, VENOUS
Acid-base deficit: 5.6 mmol/L — ABNORMAL HIGH (ref 0.0–2.0)
Bicarbonate: 20.1 mmol/L (ref 20.0–28.0)
O2 Saturation: 81.8 %
Patient temperature: 37
pCO2, Ven: 39 mmHg — ABNORMAL LOW (ref 44–60)
pH, Ven: 7.32 (ref 7.25–7.43)
pO2, Ven: 52 mmHg — ABNORMAL HIGH (ref 32–45)

## 2022-04-20 LAB — CBG MONITORING, ED
Glucose-Capillary: 123 mg/dL — ABNORMAL HIGH (ref 70–99)
Glucose-Capillary: 172 mg/dL — ABNORMAL HIGH (ref 70–99)
Glucose-Capillary: 182 mg/dL — ABNORMAL HIGH (ref 70–99)
Glucose-Capillary: 125 mg/dL — ABNORMAL HIGH (ref 70–99)

## 2022-04-20 LAB — CBC
HCT: 54.7 % — ABNORMAL HIGH (ref 39.0–52.0)
Hemoglobin: 18.6 g/dL — ABNORMAL HIGH (ref 13.0–17.0)
MCH: 31.5 pg (ref 26.0–34.0)
MCHC: 34 g/dL (ref 30.0–36.0)
MCV: 92.6 fL (ref 80.0–100.0)
Platelets: 426 10*3/uL — ABNORMAL HIGH (ref 150–400)
RBC: 5.91 MIL/uL — ABNORMAL HIGH (ref 4.22–5.81)
RDW: 11.7 % (ref 11.5–15.5)
WBC: 9 10*3/uL (ref 4.0–10.5)
nRBC: 0 % (ref 0.0–0.2)

## 2022-04-20 LAB — BETA-HYDROXYBUTYRIC ACID: Beta-Hydroxybutyric Acid: 4.41 mmol/L — ABNORMAL HIGH (ref 0.05–0.27)

## 2022-04-20 MED ORDER — POTASSIUM CHLORIDE 10 MEQ/100ML IV SOLN: 10.0000 meq | INTRAVENOUS | Status: DC

## 2022-04-20 MED ORDER — INSULIN REGULAR(HUMAN) IN NACL 100-0.9 UT/100ML-% IV SOLN
INTRAVENOUS | Status: DC
  Administered 2022-04-20: 0.7 [IU]/h via INTRAVENOUS

## 2022-04-20 MED ORDER — DEXTROSE 50 % IV SOLN: 0.0000 mL | INTRAVENOUS | Status: DC | PRN

## 2022-04-20 MED ORDER — DEXTROSE IN LACTATED RINGERS 5 % IV SOLN: INTRAVENOUS | Status: DC

## 2022-04-20 MED ORDER — LEVETIRACETAM 500 MG PO TABS
500.0000 mg | ORAL_TABLET | Freq: Two times a day (BID) | ORAL | Status: DC
  Administered 2022-04-20: 500 mg via ORAL
  Filled 2022-04-20 (×2): qty 1

## 2022-04-20 MED ORDER — DEXTROSE 5 % IV SOLN: INTRAVENOUS | Status: DC

## 2022-04-20 MED ORDER — LACTATED RINGERS IV SOLN: INTRAVENOUS | Status: DC

## 2022-04-20 MED ORDER — SODIUM CHLORIDE 0.9 % IV BOLUS
1000.0000 mL | Freq: Once | INTRAVENOUS | Status: AC
  Administered 2022-04-20: 1000 mL via INTRAVENOUS

## 2022-04-20 MED ORDER — INSULIN REGULAR(HUMAN) IN NACL 100-0.9 UT/100ML-% IV SOLN
INTRAVENOUS | Status: DC
  Administered 2022-04-20: 3.8 [IU]/h via INTRAVENOUS
  Filled 2022-04-20: qty 100

## 2022-04-20 NOTE — Assessment & Plan Note (Signed)
Saw podiatry in August.  Presented with right foot pain and had MRI on August

## 2022-04-20 NOTE — ED Provider Notes (Addendum)
[facility] Provider Note    Event Date/Time   First MD Initiated Contact with Patient 04/20/22 2042     (approximate)   History   Hyperglycemia   HPI  Jesus Ewing is a 21 y.o. male type I diabetic who is blood sugars been going up.  He is increased his insulin and has gotten his blood sugars down but he feels very bad and he thinks he has DKA.  Negative acetone is easily perceptible around him.  I believe he is correct.  He is not having any belly pain or other complaints like coughing.      Physical Exam   Triage Vital Signs: ED Triage Vitals  Enc Vitals Group     BP 04/20/22 1802 112/76     Pulse Rate 04/20/22 1802 (!) 117     Resp 04/20/22 1802 20     Temp 04/20/22 1802 97.9 F (36.6 C)     Temp Source 04/20/22 1802 Oral     SpO2 04/20/22 1802 97 %     Weight 04/20/22 1800 156 lb 8.4 oz (71 kg)     Height 04/20/22 1800 5\' 10"  (1.778 m)     Head Circumference --      Peak Flow --      Pain Score 04/20/22 1759 0     Pain Loc --      Pain Edu? --      Excl. in GC? --     Most recent vital signs: Vitals:   04/20/22 2112 04/20/22 2226  BP: 125/85 121/89  Pulse: 98 (!) 106  Resp: 20 18  Temp:    SpO2: 100% 100%     General: Awake, feels ill diffusely CV:  Good peripheral perfusion.  Heart regular rate and rhythm no audible murmurs Resp:  Normal effort.  Lungs are clear patient is breathing fairly rapidly and more deeply than usual. Abd:  No distention.  Nontender Patient has his walking boot on 1 leg.  He has diagnosis of osteomyelitis in August.  He has Charcot's joint of the foot he is wearing the cam boot at the instruction of his podiatrist.  He has had a previous amputation of the great toe.   ED Results / Procedures / Treatments   Labs (all labs ordered are listed, but only abnormal results are displayed) Labs Reviewed  CBC - Abnormal; Notable for the following components:      Result Value   RBC 5.91 (*)     Hemoglobin 18.6 (*)    HCT 54.7 (*)    Platelets 426 (*)    All other components within normal limits  BASIC METABOLIC PANEL - Abnormal; Notable for the following components:   CO2 15 (*)    Glucose, Bld 107 (*)    BUN 22 (*)    Anion gap 20 (*)    All other components within normal limits  BETA-HYDROXYBUTYRIC ACID - Abnormal; Notable for the following components:   Beta-Hydroxybutyric Acid 4.41 (*)    All other components within normal limits  BLOOD GAS, VENOUS - Abnormal; Notable for the following components:   pCO2, Ven 39 (*)    pO2, Ven 52 (*)    Acid-base deficit 5.6 (*)    All other components within normal limits  CBG MONITORING, ED - Abnormal; Notable for the following components:   Glucose-Capillary 123 (*)    All other components within normal limits  CBG MONITORING, ED - Abnormal; Notable for the  following components:   Glucose-Capillary 172 (*)    All other components within normal limits  CBG MONITORING, ED - Abnormal; Notable for the following components:   Glucose-Capillary 182 (*)    All other components within normal limits  URINALYSIS, ROUTINE W REFLEX MICROSCOPIC     EKG     RADIOLOGY    PROCEDURES:  Critical Care performed:   Procedures   MEDICATIONS ORDERED IN ED: Medications  dextrose 5 % solution ( Intravenous New Bag/Given 04/20/22 2155)  insulin regular, human (MYXREDLIN) 100 units/ 100 mL infusion (3.8 Units/hr Intravenous New Bag/Given 04/20/22 2152)  sodium chloride 0.9 % bolus 1,000 mL (1,000 mLs Intravenous New Bag/Given 04/20/22 2139)     IMPRESSION / MDM / [place] / ED COURSE  I reviewed the triage vital signs and the nursing notes. VBG is reported as pH is 7.32 bicarb of 20 and CO2 of 39. Beta hydroxybutyric acid is 4.47.  Patient has an anion gap and has bicarb that is low.  He has been giving himself a lot of insulin and has got his sugar to be almost normal but I he still is in DKA. Differential diagnosis  includes, but is not limited to, DKA uncertain why  Patient's presentation is most consistent with acute presentation with potential threat to life or bodily function.  The patient is on the cardiac monitor to evaluate for evidence of arrhythmia and/or significant heart rate changes.  None have been seen      FINAL CLINICAL IMPRESSION(S) / ED DIAGNOSES   Final diagnoses:  Diabetic ketoacidosis without coma associated with type 1 diabetes mellitus ([place])     Rx / DC Orders   ED Discharge Orders     None        Note:  This document was prepared using Dragon voice recognition software and may include unintentional dictation errors.   Nena Polio, MD 04/20/22 2235 ----------------------------------------- 10:47 PM on 04/20/2022 ----------------------------------------- Patient's feet look normal there is no sign of any infection just his amputated great toe   Nena Polio, MD 04/20/22 2248 ----------------------------------------- 10:52 PM on 04/20/2022 ----------------------------------------- Sugar is now 125 for CBG   Nena Polio, MD 04/20/22 2252

## 2022-04-20 NOTE — ED Triage Notes (Addendum)
Pt brought in with high blood sugar.    Fsbs 123 in triage.  Pt took insulin approx 2 hours ago.  Room mate with pt  He states meter read high today and yesterday.  Pt took 14 units  humlog  insulin  2 hours ago.  pt took 50 units lantus this am.  Pt reports nausea.  No v/d.  Pt responds to verbal stimuli.     Iv placed in triage. i

## 2022-04-20 NOTE — Assessment & Plan Note (Signed)
We will get UDS

## 2022-04-20 NOTE — Assessment & Plan Note (Signed)
Continue Keppra 500 twice daily 

## 2022-04-20 NOTE — Assessment & Plan Note (Signed)
Not on any medication

## 2022-04-20 NOTE — Assessment & Plan Note (Signed)
Patient euglycemic on arrival but self-administered insulin prior to arrival.  Had elevated beta hydroxy, anion gap Continue IV hydration, insulin infusion per Endo tool and transition to subcu when gap closes

## 2022-04-20 NOTE — H&P (Signed)
History and Physical    Patient: Jesus Ewing DOB: Nov 18, 2000 DOA: 04/20/2022 DOS: the patient was seen and examined on 04/20/2022 PCP: Pcp, No  Patient coming from: Home  Chief Complaint:  Chief Complaint  Patient presents with   Hyperglycemia    HPI: Jesus Ewing is a 21 y.o. male with medical history significant for type 1 diabetes mellitus, diabetic neuropathy, right great toe partial amputation for osteomyelitis (4/23), cocaine abuse, seizure disorder on Keppra, celiac disease, depression and anxiety, hospitalized in August 2023 with right foot pain believed to be early Charcot arthropathy, who presents to the ED with elevated blood sugars.  His meter at home read high for the past couple days.  Patient took 14 units Humalog insulin a couple hours prior to arrival and blood sugar in triage was 123.  He has nausea without vomiting and denies cough, shortness of breath, fever or chills, abdominal pain or diarrhea. ED course and data review: Tachycardic to 117 with otherwise normal vitals.  Labs with blood glucose 107 with anion gap of 20, bicarb 15, beta hydroxybutyric acid 4.41, venous pH 7.32.  WBC 9.0, hemoglobin 18.6. Patient given an NS bolus, started on insulin infusion as well as D5.  Hospitalist consulted for admission.   Review of Systems: As mentioned in the history of present illness. All other systems reviewed and are negative.  Past Medical History:  Diagnosis Date   Celiac disease    Diabetes mellitus without complication [facility])    Past Surgical History:  Procedure Laterality Date   AMPUTATION TOE Right 11/10/2021   Procedure: AMPUTATION TOE;  Surgeon: Felipa Furnace, DPM;  Location: [facility] ORS;  Service: Podiatry;  Laterality: Right;   NO PAST SURGERIES     Social History:  reports that he has never smoked. He has never used smokeless tobacco. He reports current alcohol use. He reports current drug use. Drug: Cocaine.  No Known  Allergies  No family history on file.  Prior to Admission medications   Medication Sig Start Date End Date Taking? Authorizing Provider  escitalopram (LEXAPRO) 10 MG tablet Take 10 mg by mouth daily. Patient not taking: Reported on 02/21/2022 11/22/21   [provider]  gabapentin (NEURONTIN) 300 MG capsule Take 1 capsule (300 mg total) by mouth 2 (two) times daily. Patient not taking: Reported on 02/21/2022 11/17/21   Felipa Furnace, DPM  insulin lispro (HUMALOG) 100 UNIT/ML KwikPen Inject 0-50 Units into the skin as directed. 07/24/18   [provider]  LANTUS SOLOSTAR 100 UNIT/ML Solostar Pen Inject 55 Units into the skin at bedtime. 05/04/19   [provider]  levETIRAcetam (KEPPRA) 500 MG tablet Take 1 tablet (500 mg total) by mouth 2 (two) times daily. 07/05/21 12/11/21  Lorella Nimrod, MD  Multiple Vitamin (MULTIVITAMIN WITH MINERALS) TABS tablet Take 1 tablet by mouth daily. Patient not taking: Reported on 02/21/2022 11/12/21   Emeterio Reeve, DO  naltrexone (DEPADE) 50 MG tablet Take 1 tablet (50 mg total) by mouth daily. Patient not taking: Reported on 02/21/2022 12/14/21   Emeterio Reeve, DO  nicotine (NICODERM CQ - DOSED IN MG/24 HR) 7 mg/24hr patch Place 1 patch (7 mg total) onto the skin daily. 02/22/22   Debbe Odea, MD    Physical Exam: Vitals:   04/20/22 1800 04/20/22 1802 04/20/22 2112 04/20/22 2226  BP:  112/76 125/85 121/89  Pulse:  (!) 117 98 (!) 106  Resp:  20 20 18   Temp:  97.9 F (36.6 C)  TempSrc:  Oral    SpO2:  97% 100% 100%  Weight: 71 kg     Height: 5\' 10"  (1.778 m)      Physical Exam Vitals and nursing note reviewed.  Constitutional:      General: He is not in acute distress. HENT:     Head: Normocephalic and atraumatic.  Cardiovascular:     Rate and Rhythm: Regular rhythm. Tachycardia present.     Heart sounds: Normal heart sounds.  Pulmonary:     Effort: Pulmonary effort is normal.     Breath sounds: Normal breath sounds.   Abdominal:     Palpations: Abdomen is soft.     Tenderness: There is no abdominal tenderness.  Neurological:     Mental Status: Mental status is at baseline. He is lethargic.     Labs on Admission: I have personally reviewed following labs and imaging studies  CBC: Recent Labs  Lab 04/20/22 1800  WBC 9.0  HGB 18.6*  HCT 54.7*  MCV 92.6  PLT 867*   Basic Metabolic Panel: Recent Labs  Lab 04/20/22 1831  NA 136  K 4.0  CL 101  CO2 15*  GLUCOSE 107*  BUN 22*  CREATININE 0.95  CALCIUM 9.5   GFR: Estimated Creatinine Clearance: 121.4 mL/min (by C-G formula based on SCr of 0.95 mg/dL). Liver Function Tests: No results for input(s): "AST", "ALT", "ALKPHOS", "BILITOT", "PROT", "ALBUMIN" in the last 168 hours. No results for input(s): "LIPASE", "AMYLASE" in the last 168 hours. No results for input(s): "AMMONIA" in the last 168 hours. Coagulation Profile: No results for input(s): "INR", "PROTIME" in the last 168 hours. Cardiac Enzymes: No results for input(s): "CKTOTAL", "CKMB", "CKMBINDEX", "TROPONINI" in the last 168 hours. BNP (last 3 results) No results for input(s): "PROBNP" in the last 8760 hours. HbA1C: No results for input(s): "HGBA1C" in the last 72 hours. CBG: Recent Labs  Lab 04/20/22 1757 04/20/22 2026 04/20/22 2148 04/20/22 2251  GLUCAP 123* 172* 182* 125*   Lipid Profile: No results for input(s): "CHOL", "HDL", "LDLCALC", "TRIG", "CHOLHDL", "LDLDIRECT" in the last 72 hours. Thyroid Function Tests: No results for input(s): "TSH", "T4TOTAL", "FREET4", "T3FREE", "THYROIDAB" in the last 72 hours. Anemia Panel: No results for input(s): "VITAMINB12", "FOLATE", "FERRITIN", "TIBC", "IRON", "RETICCTPCT" in the last 72 hours. Urine analysis:    Component Value Date/Time   COLORURINE STRAW (A) 12/11/2021 1510   APPEARANCEUR CLEAR (A) 12/11/2021 1510   LABSPEC 1.025 12/11/2021 1510   PHURINE 5.0 12/11/2021 1510   GLUCOSEU >=500 (A) 12/11/2021 1510    HGBUR NEGATIVE 12/11/2021 1510   BILIRUBINUR NEGATIVE 12/11/2021 1510   KETONESUR 80 (A) 12/11/2021 1510   PROTEINUR 30 (A) 12/11/2021 1510   NITRITE NEGATIVE 12/11/2021 1510   LEUKOCYTESUR NEGATIVE 12/11/2021 1510    Radiological Exams on Admission: No results found.   Data Reviewed: Relevant notes from primary care and specialist visits, past discharge summaries as available in EHR, including Care Everywhere. Prior diagnostic testing as pertinent to current admission diagnoses Updated medications and problem lists for reconciliation ED course, including vitals, labs, imaging, treatment and response to treatment Triage notes, nursing and pharmacy notes and ED provider's notes Notable results as noted in HPI   Assessment and Plan: * DKA, type 1 ([place]) Patient euglycemic on arrival but self-administered insulin prior to arrival.  Had elevated beta hydroxy, anion gap Continue IV hydration, insulin infusion per Endo tool and transition to subcu when gap closes  Seizure disorder (HCC) Continue Keppra 500 twice daily  Anxiety and depression Previously took Lexapro  History of substance abuse ([place]) We will get UDS  Charcot foot due to diabetes mellitus (HCC)-right foot Saw podiatry in August.  Presented with right foot pain and had MRI on August  Celiac disease Not on any medication        DVT prophylaxis: Lovenox  Consults: none  Advance Care Planning:   Code Status: Prior   Family Communication: none  Disposition Plan: Back to previous home environment  Severity of Illness: The appropriate patient status for this patient is INPATIENT. Inpatient status is judged to be reasonable and necessary in order to provide the required intensity of service to ensure the patient's safety. The patient's presenting symptoms, physical exam findings, and initial radiographic and laboratory data in the context of their chronic comorbidities is felt to place them at high risk for  further clinical deterioration. Furthermore, it is not anticipated that the patient will be medically stable for discharge from the hospital within 2 midnights of admission.   * I certify that at the point of admission it is my clinical judgment that the patient will require inpatient hospital care spanning beyond 2 midnights from the point of admission due to high intensity of service, high risk for further deterioration and high frequency of surveillance required.*  Author: Athena Masse, MD 04/20/2022 10:56 PM  For on call review [email or web address].

## 2022-04-20 NOTE — Assessment & Plan Note (Signed)
Previously took Lexapro

## 2022-04-20 NOTE — Assessment & Plan Note (Signed)
Saw podiatry in August.  Had MRI in August and was

## 2022-04-21 DIAGNOSIS — E1110 Type 2 diabetes mellitus with ketoacidosis without coma: Secondary | ICD-10-CM | POA: Diagnosis present

## 2022-04-21 LAB — BASIC METABOLIC PANEL
Anion gap: 11 (ref 5–15)
BUN: 20 mg/dL (ref 6–20)
CO2: 18 mmol/L — ABNORMAL LOW (ref 22–32)
Calcium: 8.4 mg/dL — ABNORMAL LOW (ref 8.9–10.3)
Chloride: 106 mmol/L (ref 98–111)
Creatinine, Ser: 0.72 mg/dL (ref 0.61–1.24)
GFR, Estimated: >60 mL/min (ref >60)
Glucose, Bld: 104 mg/dL — ABNORMAL HIGH (ref 70–99)
Potassium: 3.9 mmol/L (ref 3.5–5.1)
Sodium: 135 mmol/L (ref 135–145)

## 2022-04-21 LAB — CBG MONITORING, ED
Glucose-Capillary: 105 mg/dL — ABNORMAL HIGH (ref 70–99)
Glucose-Capillary: 117 mg/dL — ABNORMAL HIGH (ref 70–99)
Glucose-Capillary: 127 mg/dL — ABNORMAL HIGH (ref 70–99)
Glucose-Capillary: 135 mg/dL — ABNORMAL HIGH (ref 70–99)
Glucose-Capillary: 173 mg/dL — ABNORMAL HIGH (ref 70–99)

## 2022-04-21 MED ORDER — INSULIN GLARGINE-YFGN 100 UNIT/ML ~~LOC~~ SOLN
30.0000 [IU] | Freq: Every day | SUBCUTANEOUS | Status: DC
  Administered 2022-04-21: 30 [IU] via SUBCUTANEOUS
  Filled 2022-04-21: qty 0.3

## 2022-04-21 MED ORDER — SODIUM CHLORIDE 0.9 % IV SOLN: INTRAVENOUS | Status: DC

## 2022-04-21 MED ORDER — INSULIN ASPART 100 UNIT/ML IJ SOLN
0.0000 [IU] | Freq: Three times a day (TID) | INTRAMUSCULAR | Status: DC
  Administered 2022-04-21: 1 [IU] via SUBCUTANEOUS
  Filled 2022-04-21: qty 1

## 2022-04-21 NOTE — Inpatient Diabetes Management (Signed)
Inpatient Diabetes Program Recommendations  AACE/ADA: New Consensus Statement on Inpatient Glycemic Control   Target Ranges:  Prepandial:   less than 140 mg/dL      Peak postprandial:   less than 180 mg/dL (1-2 hours)      Critically ill patients:  140 - 180 mg/dL    Latest Reference Range & Units 04/21/22 00:04 04/21/22 01:08 04/21/22 02:54 04/21/22 05:13 04/21/22 07:50  Glucose-Capillary 70 - 99 mg/dL 117 (H) 105 (H) 173 (H) 127 (H) 135 (H)    Latest Reference Range & Units 04/20/22 17:57 04/20/22 20:26 04/20/22 21:48 04/20/22 22:51  Glucose-Capillary 70 - 99 mg/dL 123 (H) 172 (H) 182 (H) 125 (H)    Latest Reference Range & Units 04/20/22 18:31  CO2 22 - 32 mmol/L 15 (L)  Glucose 70 - 99 mg/dL 107 (H)  Anion gap 5 - 15  20 (H)    Latest Reference Range & Units 04/20/22 20:42  Beta-Hydroxybutyric Acid 0.05 - 0.27 mmol/L 4.41 (H)   Review of Glycemic Control  Diabetes history: DM1 (does NOT make any insulin; requires basal, correction, and carb coverage insulin) Outpatient Diabetes medications: Lantus 55 units QHS, Humalog for meal coverage and correction (1 unit for 8 grams of carbs, 1 unit drops glucose 50 mg/dl) Current orders for Inpatient glycemic control: Semglee 30 units QHS, Novolog 0-9 units TID with meals and HS  Inpatient Diabetes Program Recommendations:    Insulin: Patient was initially ordered IV insulin and give Semglee 30 units at 1:09 am today. Please consider ordering Novolog 4 units TID with meals for meal coverage if patient eats at least 50% of meals.  Outpatient: Patient sees Dr. Baruch Gouty (with [facility]) and was last seen 10/05/21. Patient needs to follow up with Endocrinologist.  NOTE: Noted consult for diabetes coordinator. Patient has Type 1 DM and is well known to inpatient diabetes team due to frequent hospital admissions with DKA. Patient sees Dr. Baruch Gouty (Endocrinologist with [facility]) and was last seen on 10/05/21. Per office note on  10/05/21, patient is prescribed Lantus 45 units QHS, Humalog up to 50 units per dayLast admission was 02/20/22-02/21/22. Patient presented to ED on 04/20/22 with compliant of hyperglycemia; glucose in triage was 123 mg/dl; patient had reported he took Humalog 14 units 2 hours prior to coming to the ED and took Lantus 50 units the morning of 04/20/22 at home. Patient was initially ordered IV insulin for DKA which was transitioned to SQ insulin during the night. Would recommend adding Novolog meal coverage insulin.  Thanks, Barnie Alderman, RN, MSN, [place] Diabetes Coordinator Inpatient Diabetes Program [phone] (Team Pager from 8am to [place])

## 2022-04-26 ENCOUNTER — Ambulatory Visit (INDEPENDENT_AMBULATORY_CARE_PROVIDER_SITE_OTHER): Payer: 59

## 2022-04-26 ENCOUNTER — Ambulatory Visit (INDEPENDENT_AMBULATORY_CARE_PROVIDER_SITE_OTHER): Payer: 59 | Admitting: Podiatry

## 2022-04-26 DIAGNOSIS — M14671 Charcot's joint, right ankle and foot: Secondary | ICD-10-CM | POA: Diagnosis not present

## 2022-04-30 NOTE — Progress Notes (Signed)
  Subjective:  Patient ID: Jesus Ewing, male    DOB: [birth year],  MRN: [id number]  Chief Complaint  Patient presents with   Diabetes    1 month follow up Charcot right foot    21 y.o. male presents with the above complaint. History confirmed with patient.  Pain and swelling improved, he has not gotten the brace fitted he is going tomorrow.  Still using a boot  Objective:  Physical Exam: warm, good capillary refill, no trophic changes or ulcerative lesions, normal DP and PT pulses, and abnormal sensory exam, loss of protective sensation, he has neuropathic pain and paresthesias, there is no edema or pain in the midfoot of the right foot, no gross deformity no rocker-bottom and no ulceration.   Radiographs: Multiple views x-ray of the right foot: New radiographs taken today show good stability with no increasing deformity no rocker-bottom no fragmentation of midfoot Assessment:   1. Charcot's joint of right foot      Plan:  Patient was evaluated and treated and all questions answered.  Still a stable radiographs and Charcot appears to be quiescent.  He is getting fitted for AFO tomorrow.  We will use this long-term.  Recommend he stays in the boot until then.  Return 3 months for serial x-rays or sooner if symptoms worsen or return  Return in about 3 months (around 07/27/2022) for follow up right foot Charcot (new xrays).

## 2022-05-22 ENCOUNTER — Encounter: Payer: Self-pay | Admitting: Podiatry

## 2022-05-29 ENCOUNTER — Ambulatory Visit (INDEPENDENT_AMBULATORY_CARE_PROVIDER_SITE_OTHER): Payer: 59 | Admitting: Podiatry

## 2022-05-29 ENCOUNTER — Ambulatory Visit (INDEPENDENT_AMBULATORY_CARE_PROVIDER_SITE_OTHER): Payer: 59

## 2022-05-29 DIAGNOSIS — M792 Neuralgia and neuritis, unspecified: Secondary | ICD-10-CM

## 2022-05-29 DIAGNOSIS — M79672 Pain in left foot: Secondary | ICD-10-CM | POA: Diagnosis not present

## 2022-05-29 DIAGNOSIS — M7752 Other enthesopathy of left foot: Secondary | ICD-10-CM | POA: Diagnosis not present

## 2022-05-29 DIAGNOSIS — M7662 Achilles tendinitis, left leg: Secondary | ICD-10-CM | POA: Diagnosis not present

## 2022-05-29 DIAGNOSIS — G8929 Other chronic pain: Secondary | ICD-10-CM

## 2022-05-29 DIAGNOSIS — M7750 Other enthesopathy of unspecified foot: Secondary | ICD-10-CM

## 2022-05-29 NOTE — Patient Instructions (Signed)

## 2022-05-30 ENCOUNTER — Telehealth: Payer: Self-pay | Admitting: *Deleted

## 2022-05-30 LAB — CBC WITH DIFFERENTIAL/PLATELET
Basophils Absolute: 0.1 10*3/uL (ref 0.0–0.2)
Basos: 1 %
EOS (ABSOLUTE): 0.2 10*3/uL (ref 0.0–0.4)
Eos: 5 %
Hematocrit: 47 % (ref 37.5–51.0)
Hemoglobin: 15.4 g/dL (ref 13.0–17.7)
Immature Grans (Abs): 0 10*3/uL (ref 0.0–0.1)
Immature Granulocytes: 0 %
Lymphocytes Absolute: 1.5 10*3/uL (ref 0.7–3.1)
Lymphs: 27 %
MCH: 31.9 pg (ref 26.6–33.0)
MCHC: 32.8 g/dL (ref 31.5–35.7)
MCV: 97 fL (ref 79–97)
Monocytes Absolute: 0.4 10*3/uL (ref 0.1–0.9)
Monocytes: 7 %
Neutrophils Absolute: 3.2 10*3/uL (ref 1.4–7.0)
Neutrophils: 60 %
Platelets: 305 10*3/uL (ref 150–450)
RBC: 4.83 x10E6/uL (ref 4.14–5.80)
RDW: 12.9 % (ref 11.6–15.4)
WBC: 5.3 10*3/uL (ref 3.4–10.8)

## 2022-05-30 LAB — COMPREHENSIVE METABOLIC PANEL
ALT: 39 IU/L (ref 0–44)
AST: 21 IU/L (ref 0–40)
Albumin/Globulin Ratio: 2.3 — ABNORMAL HIGH (ref 1.2–2.2)
Albumin: 4.3 g/dL (ref 4.3–5.2)
Alkaline Phosphatase: 137 IU/L — ABNORMAL HIGH (ref 44–121)
BUN/Creatinine Ratio: 27 — ABNORMAL HIGH (ref 9–20)
BUN: 22 mg/dL — ABNORMAL HIGH (ref 6–20)
Bilirubin Total: 0.4 mg/dL (ref 0.0–1.2)
CO2: 23 mmol/L (ref 20–29)
Calcium: 9.3 mg/dL (ref 8.7–10.2)
Chloride: 93 mmol/L — ABNORMAL LOW (ref 96–106)
Creatinine, Ser: 0.83 mg/dL (ref 0.76–1.27)
Globulin, Total: 1.9 g/dL (ref 1.5–4.5)
Glucose: 638 mg/dL (ref 70–99)
Potassium: 5.5 mmol/L — ABNORMAL HIGH (ref 3.5–5.2)
Sodium: 131 mmol/L — ABNORMAL LOW (ref 134–144)
Total Protein: 6.2 g/dL (ref 6.0–8.5)
eGFR: 126 mL/min/{1.73_m2} (ref >59)

## 2022-05-30 LAB — URIC ACID: Uric Acid: 4.2 mg/dL (ref 3.8–8.4)

## 2022-05-30 NOTE — Progress Notes (Signed)
  Subjective:  Patient ID: Jesus Ewing, male    DOB: 2000/07/21,  MRN: [id number]  Chief Complaint  Patient presents with   Foot Pain    left ankle/hell pain that has gotten more constant    21 y.o. male presents with the above complaint. History confirmed with patient.  He is now having worsening pain on the left side  Objective:  Physical Exam: warm, good capillary refill, no trophic changes or ulcerative lesions, normal DP and PT pulses, and abnormal sensory exam, loss of protective sensation, he has neuropathic pain and paresthesias, there is no edema on the left foot, there is diffuse nonspecific pain surrounding the Achilles tendon midfoot plantar foot and arch.  No heat noted   Radiographs: Multiple views x-ray of the right foot: New radiographs taken today of the left foot show no evidence of Charcot or joint CAM boot or dislocation.  There is a well-corticated chronic appearing avulsion fracture near the navicular on the oblique view Assessment:   1. Achilles tendinitis of left lower extremity   2. Chronic neuropathic pain   3. Pain in left foot      Plan:  Patient was evaluated and treated and all questions answered.  Most of his pain today I suspect is likely consistent with his neuropathic pain that is worsening.  He has an upcoming appoint with pain management.  We discussed that there is no clear evidence of Charcot arthropathy and I do think he can continue to weight-bear as tolerated in regular shoe gear, if it worsens he may want to wear the cam boot temporarily.  I gave him some physical therapy exercises to work on twice daily for some Achilles tinnitus which seems to be the center of most of his pain.  Lab work is ordered as well to evaluate for gout which was negative.  He did have severe hyperglycemia and I messaged him to let him know he should see his PCP ASAP for this.  No follow-ups on file.

## 2022-05-30 NOTE — Telephone Encounter (Signed)
Spoke with patient giving him results of his critical  glucose levels, was aware and said that he had just eaten chocolate chip cookies but will reach out to his pcp as well.

## 2022-05-30 NOTE — Telephone Encounter (Signed)
[facility] is calling with critical labs results for patient,  his glucose levels are at 638,will be faxing over report for review today.

## 2022-05-30 NOTE — Telephone Encounter (Signed)
Called patient , no answer, could not leave voice message , mailbox full.

## 2022-06-03 ENCOUNTER — Emergency Department: Payer: 59

## 2022-06-03 ENCOUNTER — Inpatient Hospital Stay
Admission: EM | Admit: 2022-06-03 | Discharge: 2022-06-04 | DRG: 637 | Discharge status: Against Medical Advice | Payer: 59 | Attending: Internal Medicine | Admitting: Internal Medicine

## 2022-06-03 ENCOUNTER — Other Ambulatory Visit: Payer: Self-pay

## 2022-06-03 DIAGNOSIS — Z1152 Encounter for screening for COVID-19: Secondary | ICD-10-CM | POA: Diagnosis not present

## 2022-06-03 DIAGNOSIS — Z89421 Acquired absence of other right toe(s): Secondary | ICD-10-CM | POA: Diagnosis not present

## 2022-06-03 DIAGNOSIS — E860 Dehydration: Secondary | ICD-10-CM | POA: Diagnosis present

## 2022-06-03 DIAGNOSIS — E1040 Type 1 diabetes mellitus with diabetic neuropathy, unspecified: Secondary | ICD-10-CM | POA: Diagnosis present

## 2022-06-03 DIAGNOSIS — Z91148 Patient's other noncompliance with medication regimen for other reason: Secondary | ICD-10-CM | POA: Diagnosis not present

## 2022-06-03 DIAGNOSIS — Z89432 Acquired absence of left foot: Secondary | ICD-10-CM

## 2022-06-03 DIAGNOSIS — Z794 Long term (current) use of insulin: Secondary | ICD-10-CM

## 2022-06-03 DIAGNOSIS — K900 Celiac disease: Secondary | ICD-10-CM | POA: Diagnosis present

## 2022-06-03 DIAGNOSIS — K8681 Exocrine pancreatic insufficiency: Secondary | ICD-10-CM | POA: Diagnosis present

## 2022-06-03 DIAGNOSIS — R4182 Altered mental status, unspecified: Secondary | ICD-10-CM | POA: Diagnosis not present

## 2022-06-03 DIAGNOSIS — E1010 Type 1 diabetes mellitus with ketoacidosis without coma: Secondary | ICD-10-CM | POA: Diagnosis present

## 2022-06-03 DIAGNOSIS — G9341 Metabolic encephalopathy: Secondary | ICD-10-CM | POA: Diagnosis present

## 2022-06-03 DIAGNOSIS — E0810 Diabetes mellitus due to underlying condition with ketoacidosis without coma: Secondary | ICD-10-CM | POA: Diagnosis not present

## 2022-06-03 LAB — COMPREHENSIVE METABOLIC PANEL
ALT: 44 U/L (ref 0–44)
AST: 22 U/L (ref 15–41)
Albumin: 4.5 g/dL (ref 3.5–5.0)
Alkaline Phosphatase: 150 U/L — ABNORMAL HIGH (ref 38–126)
Anion gap: 21 — ABNORMAL HIGH (ref 5–15)
BUN: 28 mg/dL — ABNORMAL HIGH (ref 6–20)
CO2: 13 mmol/L — ABNORMAL LOW (ref 22–32)
Calcium: 9.5 mg/dL (ref 8.9–10.3)
Chloride: 100 mmol/L (ref 98–111)
Creatinine, Ser: 1 mg/dL (ref 0.61–1.24)
GFR, Estimated: >60 mL/min (ref >60)
Glucose, Bld: 142 mg/dL — ABNORMAL HIGH (ref 70–99)
Potassium: 4.7 mmol/L (ref 3.5–5.1)
Sodium: 134 mmol/L — ABNORMAL LOW (ref 135–145)
Total Bilirubin: 2.3 mg/dL — ABNORMAL HIGH (ref 0.3–1.2)
Total Protein: 8.6 g/dL — ABNORMAL HIGH (ref 6.5–8.1)

## 2022-06-03 LAB — URINALYSIS, ROUTINE W REFLEX MICROSCOPIC
Bacteria, UA: NONE SEEN
Bilirubin Urine: NEGATIVE
Glucose, UA: >=500 mg/dL — AB
Hgb urine dipstick: NEGATIVE
Ketones, ur: 80 mg/dL — AB
Leukocytes,Ua: NEGATIVE
Nitrite: NEGATIVE
Protein, ur: 30 mg/dL — AB
Specific Gravity, Urine: 1.021 (ref 1.005–1.030)
pH: 5 (ref 5.0–8.0)

## 2022-06-03 LAB — BASIC METABOLIC PANEL
Anion gap: 18 — ABNORMAL HIGH (ref 5–15)
BUN: 25 mg/dL — ABNORMAL HIGH (ref 6–20)
CO2: 8 mmol/L — ABNORMAL LOW (ref 22–32)
Calcium: 7.6 mg/dL — ABNORMAL LOW (ref 8.9–10.3)
Chloride: 108 mmol/L (ref 98–111)
Creatinine, Ser: 0.82 mg/dL (ref 0.61–1.24)
GFR, Estimated: >60 mL/min (ref >60)
Glucose, Bld: 154 mg/dL — ABNORMAL HIGH (ref 70–99)
Potassium: 4.3 mmol/L (ref 3.5–5.1)
Sodium: 134 mmol/L — ABNORMAL LOW (ref 135–145)

## 2022-06-03 LAB — BLOOD GAS, VENOUS
Acid-base deficit: 15.8 mmol/L — ABNORMAL HIGH (ref 0.0–2.0)
Bicarbonate: 12.6 mmol/L — ABNORMAL LOW (ref 20.0–28.0)
O2 Saturation: 69.3 %
Patient temperature: 37
pCO2, Ven: 38 mmHg — ABNORMAL LOW (ref 44–60)
pH, Ven: 7.13 — CL (ref 7.25–7.43)
pO2, Ven: 40 mmHg (ref 32–45)

## 2022-06-03 LAB — LACTIC ACID, PLASMA
Lactic Acid, Venous: 1.1 mmol/L (ref 0.5–1.9)
Lactic Acid, Venous: 0.9 mmol/L (ref 0.5–1.9)

## 2022-06-03 LAB — RESP PANEL BY RT-PCR (FLU A&B, COVID) ARPGX2
Influenza A by PCR: NEGATIVE
Influenza B by PCR: NEGATIVE
SARS Coronavirus 2 by RT PCR: NEGATIVE

## 2022-06-03 LAB — CBC WITH DIFFERENTIAL/PLATELET
Abs Immature Granulocytes: 0.07 10*3/uL (ref 0.00–0.07)
Basophils Absolute: 0.1 10*3/uL (ref 0.0–0.1)
Basophils Relative: 1 %
Eosinophils Absolute: 0.2 10*3/uL (ref 0.0–0.5)
Eosinophils Relative: 2 %
HCT: 54.1 % — ABNORMAL HIGH (ref 39.0–52.0)
Hemoglobin: 19.1 g/dL — ABNORMAL HIGH (ref 13.0–17.0)
Immature Granulocytes: 1 %
Lymphocytes Relative: 40 %
Lymphs Abs: 3.3 10*3/uL (ref 0.7–4.0)
MCH: 31.7 pg (ref 26.0–34.0)
MCHC: 35.3 g/dL (ref 30.0–36.0)
MCV: 89.7 fL (ref 80.0–100.0)
Monocytes Absolute: 0.5 10*3/uL (ref 0.1–1.0)
Monocytes Relative: 6 %
Neutro Abs: 4.1 10*3/uL (ref 1.7–7.7)
Neutrophils Relative %: 50 %
Platelets: 398 10*3/uL (ref 150–400)
RBC: 6.03 MIL/uL — ABNORMAL HIGH (ref 4.22–5.81)
RDW: 12.9 % (ref 11.5–15.5)
WBC: 8.2 10*3/uL (ref 4.0–10.5)
nRBC: 0 % (ref 0.0–0.2)

## 2022-06-03 LAB — URINE DRUG SCREEN, QUALITATIVE (ARMC ONLY)
Amphetamines, Ur Screen: POSITIVE — AB
Barbiturates, Ur Screen: NOT DETECTED
Benzodiazepine, Ur Scrn: NOT DETECTED
Cannabinoid 50 Ng, Ur ~~LOC~~: NOT DETECTED
Cocaine Metabolite,Ur ~~LOC~~: NOT DETECTED
MDMA (Ecstasy)Ur Screen: NOT DETECTED
Methadone Scn, Ur: NOT DETECTED
Opiate, Ur Screen: NOT DETECTED
Phencyclidine (PCP) Ur S: NOT DETECTED
Tricyclic, Ur Screen: NOT DETECTED

## 2022-06-03 LAB — CBG MONITORING, ED
Glucose-Capillary: 143 mg/dL — ABNORMAL HIGH (ref 70–99)
Glucose-Capillary: 161 mg/dL — ABNORMAL HIGH (ref 70–99)
Glucose-Capillary: 182 mg/dL — ABNORMAL HIGH (ref 70–99)

## 2022-06-03 LAB — OSMOLALITY: Osmolality: 302 mOsm/kg — ABNORMAL HIGH (ref 275–295)

## 2022-06-03 LAB — PHOSPHORUS: Phosphorus: 3.4 mg/dL (ref 2.5–4.6)

## 2022-06-03 LAB — TROPONIN I (HIGH SENSITIVITY)
Troponin I (High Sensitivity): <2 ng/L (ref <18)
Troponin I (High Sensitivity): 3 ng/L (ref <18)

## 2022-06-03 LAB — MAGNESIUM: Magnesium: 1.8 mg/dL (ref 1.7–2.4)

## 2022-06-03 LAB — BETA-HYDROXYBUTYRIC ACID: Beta-Hydroxybutyric Acid: 7.53 mmol/L — ABNORMAL HIGH (ref 0.05–0.27)

## 2022-06-03 MED ORDER — SODIUM CHLORIDE 0.9 % IV BOLUS
1000.0000 mL | Freq: Once | INTRAVENOUS | Status: AC
  Administered 2022-06-03: 1000 mL via INTRAVENOUS

## 2022-06-03 MED ORDER — MAGNESIUM SULFATE 2 GM/50ML IV SOLN
2.0000 g | Freq: Once | INTRAVENOUS | Status: AC
  Administered 2022-06-03: 2 g via INTRAVENOUS
  Filled 2022-06-03: qty 50

## 2022-06-03 MED ORDER — CALCIUM GLUCONATE-NACL 1-0.675 GM/50ML-% IV SOLN
1.0000 g | Freq: Once | INTRAVENOUS | Status: AC
  Administered 2022-06-04: 1000 mg via INTRAVENOUS
  Filled 2022-06-03: qty 50

## 2022-06-03 MED ORDER — INSULIN REGULAR(HUMAN) IN NACL 100-0.9 UT/100ML-% IV SOLN
INTRAVENOUS | Status: DC
  Administered 2022-06-03: 3.6 [IU]/h via INTRAVENOUS
  Filled 2022-06-03: qty 100

## 2022-06-03 MED ORDER — DEXTROSE-NACL 5-0.9 % IV SOLN: INTRAVENOUS | Status: DC

## 2022-06-03 MED ORDER — SODIUM CHLORIDE 0.9 % IV BOLUS: 500.0000 mL | Freq: Once | INTRAVENOUS | Status: DC

## 2022-06-03 MED ORDER — DIAZEPAM 5 MG/ML IJ SOLN
5.0000 mg | Freq: Once | INTRAMUSCULAR | Status: DC
  Filled 2022-06-03: qty 2

## 2022-06-03 NOTE — H&P (Addendum)
History and Physical    Jesus Ewing NWG:[id number] DOB: [birth year] DOA: 06/03/2022  PCP: Pcp, No  Patient coming from: Home  Chief Complaint: confusion, feels like in DKA   HPI: Jesus Ewing is a 21 y.o. male with medical history significant of Diabetes type 1, left foot amputation, celiac disease who presents due to feeling unwell, feeling he is in DKA.   [name] is very fatigued, but answering questions.  He notes no recent illness, no change in medications.  He does note that he is taking 50 units of insulin glargine at night and his roommate is helping him with his short acting insulin, so he is not sure what he is taking.  His stepfather who supplies some of the history reports that they are not sure if he is taking any short acting insulin.  [name] reports no intake all day and no fluids for at least 18 hours.  He feels cold and dehydrated.  He notes lower abdominal pain and no urination for the last few hours.  He further notes about 2 months of aching leg pain bilaterally.  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  He denies chest pain, SOB, fever, recent illness, nausea, vomiting.   ED Course: In the ED, he was noted to be acidotic with a pH of 7.17, AG is elevated and BHB is elevated.  He has a CO2 of 13.  Urine studies are ordered, but he has not urinated yet.  CT head did not show any acute abnormality.   Review of Systems: As per HPI otherwise all other systems reviewed and are negative.   Past Medical History:  Diagnosis Date   Celiac disease    Diabetes mellitus without complication [facility])     Past Surgical History:  Procedure Laterality Date   AMPUTATION TOE Right 11/10/2021   Procedure: AMPUTATION TOE;  Surgeon: Candelaria Stagers, DPM;  Location: [facility] ORS;  Service: Podiatry;  Laterality: Right;    Social History  reports that he has never smoked. He has never used smokeless tobacco. He reports  current alcohol use. He reports current drug use. Drug: Cocaine.  No Known Allergies  Family History  Problem Relation Age of Onset   Healthy Mother    Diabetes Neg Hx      Prior to Admission medications                               insulin lispro (HUMALOG) 100 UNIT/ML KwikPen Inject 0-50 Units into the skin as directed. 07/24/18   [provider]  LANTUS SOLOSTAR 100 UNIT/ML Solostar Pen Inject 55 Units into the skin at bedtime. 05/04/19   [provider]                                Physical Exam: Vitals:   06/03/22 1727 06/03/22 1728  BP: 121/84   Pulse: (!) 102   Resp: 18   Temp: 97.8 F (36.6 C)   TempSrc: Oral   SpO2: 100%   Weight:  67.1 kg  Height:  5\' 10"  (1.778 m)    Constitutional: Lying curled in bed, alert to voice and conversant.  Eyes: conjunctival injection bilaterally, does not move eyes to command, looks up when asked to look down ENMT: MM are very dry Neck: normal, supple+ Respiratory: clear to auscultation  bilaterally, no wheezing or rales Cardiovascular: Mildly tachycardic, no murmur noted, pulses intact in DP arteries bilaterally.  Abdomen: + TTP and distention in the suprapubic area, no tenderness elsewhere, +BS Musculoskeletal: normal tone and bulk for age.  He is s/p right great toe amputation.  He has tenderness over the lateral edge of the amputation.  No skin breakdown noted.  Skin: no rashes, lesions, ulcers on legs, feet.  He is s/p amputation with no skin breakdown and no wounds on the right great toe.  Neurologic: Eye motions abnormal as noted above, unclear if unable or due to confusion.  Moved other extremities on command, no focal weakness.  Psychiatric: Normal speech, mood.   Labs on Admission: I have personally reviewed following labs and imaging studies  CBC: Recent Labs  Lab 05/29/22 1358 06/03/22 1729  WBC 5.3 8.2  NEUTROABS 3.2 4.1  HGB 15.4 19.1*  HCT 47.0 54.1*  MCV 97 89.7  PLT 305 398     Basic Metabolic Panel: Recent Labs  Lab 05/29/22 1358 06/03/22 1729  NA 131* 134*  K 5.5* 4.7  CL 93* 100  CO2 23 13*  GLUCOSE 638* 142*  BUN 22* 28*  CREATININE 0.83 1.00  CALCIUM 9.3 9.5    GFR: Estimated Creatinine Clearance: 109 mL/min (by C-G formula based on SCr of 1 mg/dL).  Liver Function Tests: Recent Labs  Lab 05/29/22 1358 06/03/22 1729  AST 21 22  ALT 39 44  ALKPHOS 137* 150*  BILITOT 0.4 2.3*  PROT 6.2 8.6*  ALBUMIN 4.3 4.5    Urine analysis: Pending  Radiological Exams on Admission: CT Head Wo Contrast  Result Date: 06/03/2022 CLINICAL DATA:  Altered mental status. EXAM: CT HEAD WITHOUT CONTRAST TECHNIQUE: Contiguous axial images were obtained from the base of the skull through the vertex without intravenous contrast. RADIATION DOSE REDUCTION: This exam was performed according to the departmental dose-optimization program which includes automated exposure control, adjustment of the mA and/or kV according to patient size and/or use of iterative reconstruction technique. COMPARISON:  MRI Brain 02/14/21, CT Brain 02/14/21 FINDINGS: Brain: Redemonstrated hyperdense lesion in the left frontal lobe compatible with a cavernous malformation, not significantly changed in size compared to prior CT brain. No CT evidence of acute infarct. No new sites of hemorrhage. Unchanged appearance of the ventricular system. No extra-axial fluid collection. Vascular: No hyperdense vessel or unexpected calcification. Skull: Normal. Negative for fracture or focal lesion. Sinuses/Orbits: No acute finding. Other: None. IMPRESSION: 1. No acute intracranial process. No CT finding to explain patient's ocular symptoms. 2. 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 Electronically Signed   By: Lorenza Cambridge M.D.   On: 06/03/2022 19:10   DG Chest 1 View  Result Date: 06/03/2022 CLINICAL DATA:  Weakness, short  of breath EXAM: CHEST  1 VIEW COMPARISON:  None Available. FINDINGS: Normal mediastinum and cardiac silhouette. Normal pulmonary vasculature. No evidence of effusion, infiltrate, or pneumothorax. No acute bony abnormality. Irregular density projecting over the LEFT upper lobe measuring 10 mm. This could represent a misshapen electrode lead. No wires leading to this density. IMPRESSION: 1. Density projecting LEFT upper lobe representing a pulmonary nodule versus electrode lead. Recommend clinical correlation. 2. Otherwise no acute cardiopulmonary findings. Electronically Signed   By: Genevive Bi M.D.   On: 06/03/2022 18:17    EKG: Independently reviewed. RAE, otherwise without ST or TW changes.  TnI pending.   Assessment/Plan  DKA, type  1 Acute metabolic encephalopathy Possible UTI - Endotool started - IVF as per endotool - Admit to SDU for close monitoring - Trend AG and BHB - K 4.7, replaced with 2 runs of KCL - BMET every 4 hours - Check L.A. - NPO until gap closes - UA and UDS pending to explain onset of DKA - TnI pending - no signs or symptoms of MI at this time - Irregular density noted on chest xray - consider follow up once improved - If any signs/symptoms of infection, would start broad spectrum Abx and get BC X 2  UPDATE: UA showed no sign of infection.  UDS + for amphetamine, possible cause of DKA vs. Not taking enough insulin.   Foot pain - Xray of foot to further evaluate  Leg pain - Consider another trial of gabapentin once he is able to take PO and gap closed - Was previously on gabapentin for neuropathy.  - No open wounds on exam today.    DVT prophylaxis: Lovenox  Code Status:   Full  Family Communication:  Stepfather at bedside  Disposition Plan:   Patient is from:  Home  Anticipated DC to:  Home  Anticipated DC date:  06/05/22  Anticipated DC barriers: Medication needs  Consults called:  None  Admission status:  INPT, SDU   Severity of Illness: The  appropriate patient status for this patient is INPATIENT. Inpatient status is judged to be reasonable and necessary in order to provide the required intensity of service to ensure the patient's safety. The patient's presenting symptoms, physical exam findings, and initial radiographic and laboratory data in the context of their chronic comorbidities is felt to place them at high risk for further clinical deterioration. Furthermore, it is not anticipated that the patient will be medically stable for discharge from the hospital within 2 midnights of admission.   * I certify that at the point of admission it is my clinical judgment that the patient will require inpatient hospital care spanning beyond 2 midnights from the point of admission due to high intensity of service, high risk for further deterioration and high frequency of surveillance required.Debe Coder MD [facility]  How to contact the [facility] Attending or Consulting provider 7A - 7P or covering provider during after hours 7P -7A, for this patient?   Check the care team in [facility] and look for a) attending/consulting [facility] provider listed and b) the [facility] team listed Log into [email or web address] and use [facility]'s universal password to access. If you do not have the password, please contact the hospital operator. Locate the [facility] provider you are looking for under [facility] and page to a number that you can be directly reached. If you still have difficulty reaching the provider, please page the [facility] (Director on Call) for the Hospitalists listed on amion for assistance.  06/03/2022, 7:28 PM

## 2022-06-03 NOTE — ED Provider Notes (Signed)
[facility] Provider Note    Event Date/Time   First MD Initiated Contact with Patient 06/03/22 1752     (approximate)   History   Altered Mental Status   HPI  Jesus Ewing is a 21 y.o. male who in triage study was lethargic and had high blood sugar earlier.  He is a type I diabetic.  He thinks his got DKA.      Physical Exam   Triage Vital Signs: ED Triage Vitals  Enc Vitals Group     BP 06/03/22 1727 121/84     Pulse Rate 06/03/22 1727 (!) 102     Resp 06/03/22 1727 18     Temp 06/03/22 1727 97.8 F (36.6 C)     Temp Source 06/03/22 1727 Oral     SpO2 06/03/22 1727 100 %     Weight 06/03/22 1728 148 lb (67.1 kg)     Height 06/03/22 1728 5\' 10"  (1.778 m)     Head Circumference --      Peak Flow --      Pain Score 06/03/22 1728 5     Pain Loc --      Pain Edu? --      Excl. in GC? --     Most recent vital signs: Vitals:   06/03/22 1727  BP: 121/84  Pulse: (!) 102  Resp: 18  Temp: 97.8 F (36.6 C)  SpO2: 100%     General: Patient sleepy but arousable.  Smells faintly ketotic Eyes: Pupils equal round reactive, patient cannot look down he says. CV:  Good peripheral perfusion.  Heart regular rate and rhythm no audible murmurs Resp:  Normal effort.  Lungs are clear Abd:  No distention.  Soft and nontender Extremities no edema   ED Results / Procedures / Treatments   Labs (all labs ordered are listed, but only abnormal results are displayed) Labs Reviewed  COMPREHENSIVE METABOLIC PANEL - Abnormal; Notable for the following components:      Result Value   Sodium 134 (*)    CO2 13 (*)    Glucose, Bld 142 (*)    BUN 28 (*)    Total Protein 8.6 (*)    Alkaline Phosphatase 150 (*)    Total Bilirubin 2.3 (*)    Anion gap 21 (*)    All other components within normal limits  CBC WITH DIFFERENTIAL/PLATELET - Abnormal; Notable for the following components:   RBC 6.03 (*)    Hemoglobin 19.1 (*)    HCT 54.1 (*)     All other components within normal limits  BLOOD GAS, VENOUS - Abnormal; Notable for the following components:   pH, Ven 7.17 (*)    pCO2, Ven 37 (*)    Bicarbonate 13.5 (*)    Acid-base deficit 14.2 (*)    All other components within normal limits  BETA-HYDROXYBUTYRIC ACID - Abnormal; Notable for the following components:   Beta-Hydroxybutyric Acid 7.53 (*)    All other components within normal limits  OSMOLALITY - Abnormal; Notable for the following components:   Osmolality 302 (*)    All other components within normal limits  CBG MONITORING, ED - Abnormal; Notable for the following components:   Glucose-Capillary 143 (*)    All other components within normal limits  CBG MONITORING, ED - Abnormal; Notable for the following components:   Glucose-Capillary 161 (*)    All other components within normal limits  RESP PANEL BY RT-PCR (FLU A&B, COVID) ARPGX2  LACTIC ACID, PLASMA  URINALYSIS, ROUTINE W REFLEX MICROSCOPIC  URINE DRUG SCREEN, QUALITATIVE ([facility] ONLY)  LACTIC ACID, PLASMA  TROPONIN I (HIGH SENSITIVITY)     EKG  EKG read interpreted by me shows normal sinus rhythm rate of 100 normal axis right atrial enlargement no other obvious changes are seen   RADIOLOGY Chest x-ray read by radiology reviewed by me shows no obvious infiltrates.  There is a possible nodule in the left upper lobe.  CT of the head does not show any acute changes there are some calcifications in the left cerebral hemisphere but these are present and look the same as a CT done about a year ago. PROCEDURES:  Critical Care performed: Degele care time 20 minutes this involves going into see the patient a couple times reviewing his lab work and studies and then I am talking to the hospitalist.  Procedures   MEDICATIONS ORDERED IN ED: Medications  sodium chloride 0.9 % bolus 1,000 mL (has no administration in time range)  insulin regular, human (MYXREDLIN) 100 units/ 100 mL infusion (has no  administration in time range)  sodium chloride 0.9 % bolus 1,000 mL (1,000 mLs Intravenous New Bag/Given 06/03/22 1756)     IMPRESSION / MDM / ASSESSMENT AND PLAN / ED COURSE  I reviewed the triage vital signs and the nursing notes. Patient with history of drug abuse.  He says he is not using any drugs now.  His beta hydroxybutyric acid is elevated 7.5 his pH is down at 7.1 osmolality is elevated he says his roommates been taking care of him but not giving him a lot of insulin.  It looks like he has DKA.  He has not given [place] a urine at this point. Differential diagnosis includes, but is not limited to, DKA, drug use associated with DKA, other toxic metabolic problem  Patient's presentation is most consistent with acute presentation with potential threat to life or bodily function.  The patient is on the cardiac monitor to evaluate for evidence of arrhythmia and/or significant heart rate changes.  None have been seen  I have ordered insulin drip pharmacy is calculating the dose.  I have notified the pharmacy that his sugar is not elevated and he will need D5. FINAL CLINICAL IMPRESSION(S) / ED DIAGNOSES   Final diagnoses:  Altered mental status, unspecified altered mental status type  Diabetic ketoacidosis without coma associated with diabetes mellitus due to underlying condition (HCC)     Rx / DC Orders   ED Discharge Orders     None        Note:  This document was prepared using Dragon voice recognition software and may include unintentional dictation errors.   Arnaldo Natal, MD 06/03/22 [id number]

## 2022-06-03 NOTE — ED Provider Triage Note (Signed)
Emergency Medicine Provider Triage Evaluation Note  Jesus Ewing , a 21 y.o. male  was evaluated in triage.  Pt complains of altered mental status, shortness of breath, weakness.  Patient is a known type I diabetic.  Has frequently been in DKA in the past.  Over the last 24 hours patient has had a rising blood sugars.  Patient thinks his blood sugar was over 500 or maybe even 600 earlier this morning.  Patient reports that his roommate has been giving him "a lot" of insulin.  Blood sugars on arrival were 143.  However patient is very drowsy, is constantly having to be aroused in triage..  Review of Systems  Positive:  Weakness, shortness of breath Negative: Headache, vision changes, chest pain  Physical Exam  There were no vitals taken for this visit. Gen:   Patient is very drowsy.  Awakes to verbal stimuli.  No distress   Resp:  Normal effort  MSK:   Moves extremities without difficulty  Other:    Medical Decision Making  Medically screening exam initiated at 5:25 PM.  Appropriate orders placed.  Jesus Ewing was informed that the remainder of the evaluation will be completed by another provider, this initial triage assessment does not replace that evaluation, and the importance of remaining in the ED until their evaluation is complete.  Patient arrives with known history of DKA, type 1 diabetes.  Patient's blood sugar was above 500 or 600 earlier today.  He has not been feeling well for the last 2-3 days.  Patient will have labs, urinalysis, chest xray.  Current blood sugar is 143 in triage but patient states that his roommate has been getting him "a lot" of insulin throughout the day.   Racheal Patches, PA-C 06/03/22 1729

## 2022-06-03 NOTE — ED Notes (Signed)
Bladder scan greater than 200 cc 

## 2022-06-03 NOTE — ED Triage Notes (Signed)
Pt arrives with c/o being lethargic and hyperglycemia earlier today. Pt is type 1 diabetic. Pt endorses SOB. Pt is arousable by voice in triage, but very lethargic. Per pt, his last sugar was 126 at home.

## 2022-06-04 ENCOUNTER — Inpatient Hospital Stay: Payer: 59

## 2022-06-04 DIAGNOSIS — E1010 Type 1 diabetes mellitus with ketoacidosis without coma: Secondary | ICD-10-CM | POA: Diagnosis not present

## 2022-06-04 DIAGNOSIS — R4182 Altered mental status, unspecified: Secondary | ICD-10-CM | POA: Diagnosis not present

## 2022-06-04 LAB — CK: Total CK: 62 U/L (ref 49–397)

## 2022-06-04 LAB — CBC
HCT: 43.3 % (ref 39.0–52.0)
HCT: 42.5 % (ref 39.0–52.0)
Hemoglobin: 15.1 g/dL (ref 13.0–17.0)
Hemoglobin: 14.8 g/dL (ref 13.0–17.0)
MCH: 31.9 pg (ref 26.0–34.0)
MCH: 31.4 pg (ref 26.0–34.0)
MCHC: 34.9 g/dL (ref 30.0–36.0)
MCHC: 34.8 g/dL (ref 30.0–36.0)
MCV: 91.5 fL (ref 80.0–100.0)
MCV: 90.2 fL (ref 80.0–100.0)
Platelets: 284 10*3/uL (ref 150–400)
Platelets: 288 10*3/uL (ref 150–400)
RBC: 4.73 MIL/uL (ref 4.22–5.81)
RBC: 4.71 MIL/uL (ref 4.22–5.81)
RDW: 13 % (ref 11.5–15.5)
RDW: 13 % (ref 11.5–15.5)
WBC: 8.2 10*3/uL (ref 4.0–10.5)
WBC: 7.3 10*3/uL (ref 4.0–10.5)
nRBC: 0 % (ref 0.0–0.2)
nRBC: 0 % (ref 0.0–0.2)

## 2022-06-04 LAB — BASIC METABOLIC PANEL
Anion gap: 14 (ref 5–15)
Anion gap: 9 (ref 5–15)
BUN: 22 mg/dL — ABNORMAL HIGH (ref 6–20)
BUN: 20 mg/dL (ref 6–20)
CO2: 11 mmol/L — ABNORMAL LOW (ref 22–32)
CO2: 19 mmol/L — ABNORMAL LOW (ref 22–32)
Calcium: 8 mg/dL — ABNORMAL LOW (ref 8.9–10.3)
Calcium: 7.7 mg/dL — ABNORMAL LOW (ref 8.9–10.3)
Chloride: 109 mmol/L (ref 98–111)
Chloride: 107 mmol/L (ref 98–111)
Creatinine, Ser: 0.87 mg/dL (ref 0.61–1.24)
Creatinine, Ser: 0.8 mg/dL (ref 0.61–1.24)
GFR, Estimated: >60 mL/min (ref >60)
GFR, Estimated: >60 mL/min (ref >60)
Glucose, Bld: 168 mg/dL — ABNORMAL HIGH (ref 70–99)
Glucose, Bld: 127 mg/dL — ABNORMAL HIGH (ref 70–99)
Potassium: 3.4 mmol/L — ABNORMAL LOW (ref 3.5–5.1)
Potassium: 3.9 mmol/L (ref 3.5–5.1)
Sodium: 134 mmol/L — ABNORMAL LOW (ref 135–145)
Sodium: 135 mmol/L (ref 135–145)

## 2022-06-04 LAB — CBG MONITORING, ED
Glucose-Capillary: 108 mg/dL — ABNORMAL HIGH (ref 70–99)
Glucose-Capillary: 113 mg/dL — ABNORMAL HIGH (ref 70–99)
Glucose-Capillary: 137 mg/dL — ABNORMAL HIGH (ref 70–99)
Glucose-Capillary: 146 mg/dL — ABNORMAL HIGH (ref 70–99)
Glucose-Capillary: 157 mg/dL — ABNORMAL HIGH (ref 70–99)
Glucose-Capillary: 160 mg/dL — ABNORMAL HIGH (ref 70–99)
Glucose-Capillary: 168 mg/dL — ABNORMAL HIGH (ref 70–99)
Glucose-Capillary: 224 mg/dL — ABNORMAL HIGH (ref 70–99)

## 2022-06-04 LAB — BLOOD GAS, VENOUS
Acid-base deficit: 15 mmol/L — ABNORMAL HIGH (ref 0.0–2.0)
Acid-base deficit: 6.6 mmol/L — ABNORMAL HIGH (ref 0.0–2.0)
Bicarbonate: 11.7 mmol/L — ABNORMAL LOW (ref 20.0–28.0)
Bicarbonate: 19.1 mmol/L — ABNORMAL LOW (ref 20.0–28.0)
O2 Saturation: 87.7 %
O2 Saturation: 87.7 %
Patient temperature: 37
Patient temperature: 37
pCO2, Ven: 30 mmHg — ABNORMAL LOW (ref 44–60)
pCO2, Ven: 38 mmHg — ABNORMAL LOW (ref 44–60)
pH, Ven: 7.2 — ABNORMAL LOW (ref 7.25–7.43)
pH, Ven: 7.31 (ref 7.25–7.43)
pO2, Ven: 57 mmHg — ABNORMAL HIGH (ref 32–45)
pO2, Ven: 52 mmHg — ABNORMAL HIGH (ref 32–45)

## 2022-06-04 LAB — BETA-HYDROXYBUTYRIC ACID: Beta-Hydroxybutyric Acid: 4.03 mmol/L — ABNORMAL HIGH (ref 0.05–0.27)

## 2022-06-04 MED ORDER — POTASSIUM CHLORIDE 10 MEQ/100ML IV SOLN
10.0000 meq | INTRAVENOUS | Status: AC
  Administered 2022-06-04 (×2): 10 meq via INTRAVENOUS
  Filled 2022-06-04: qty 100

## 2022-06-04 MED ORDER — INSULIN GLARGINE-YFGN 100 UNIT/ML ~~LOC~~ SOLN
15.0000 [IU] | Freq: Every day | SUBCUTANEOUS | Status: DC
  Administered 2022-06-04: 15 [IU] via SUBCUTANEOUS
  Filled 2022-06-04: qty 0.15

## 2022-06-04 MED ORDER — DEXTROSE 50 % IV SOLN: 0.0000 mL | INTRAVENOUS | Status: DC | PRN

## 2022-06-04 MED ORDER — LACTATED RINGERS IV SOLN: INTRAVENOUS | Status: DC

## 2022-06-04 MED ORDER — ENOXAPARIN SODIUM 40 MG/0.4ML IJ SOSY
40.0000 mg | PREFILLED_SYRINGE | INTRAMUSCULAR | Status: DC
  Filled 2022-06-04: qty 0.4

## 2022-06-04 MED ORDER — SODIUM BICARBONATE 8.4 % IV SOLN
INTRAVENOUS | Status: AC
  Filled 2022-06-04: qty 1000

## 2022-06-04 MED ORDER — POTASSIUM CHLORIDE 10 MEQ/100ML IV SOLN
10.0000 meq | INTRAVENOUS | Status: AC
  Administered 2022-06-04 (×2): 10 meq via INTRAVENOUS
  Filled 2022-06-04 (×3): qty 100

## 2022-06-04 MED ORDER — INSULIN REGULAR(HUMAN) IN NACL 100-0.9 UT/100ML-% IV SOLN: INTRAVENOUS | Status: DC

## 2022-06-04 MED ORDER — INSULIN GLARGINE-YFGN 100 UNIT/ML ~~LOC~~ SOLN
15.0000 [IU] | Freq: Every day | SUBCUTANEOUS | Status: DC
  Filled 2022-06-04: qty 0.15

## 2022-06-04 MED ORDER — DEXTROSE IN LACTATED RINGERS 5 % IV SOLN: INTRAVENOUS | Status: DC

## 2022-06-04 MED ORDER — SODIUM BICARBONATE 8.4 % IV SOLN
INTRAVENOUS | Status: DC
  Filled 2022-06-04 (×3): qty 1000

## 2022-06-04 MED ORDER — INSULIN ASPART 100 UNIT/ML IJ SOLN: 0.0000 [IU] | INTRAMUSCULAR | Status: DC

## 2022-06-04 NOTE — Discharge Summary (Signed)
Physician Discharge Summary   Patient: Jesus Ewing MRN: [id number] DOB: 07-02-2001  Admit date:     06/03/2022  Discharge date: 06/04/22  Discharge Physician: Ezekiel Slocumb   PCP: Pcp, No   Recommendations at discharge:   None given as patient left AMA    Patient signed out [place] High risk of recurrent DKA, further morbidity and readmission, potential mortality given non-compliance with medical recommendations.    Discharge Diagnoses: Principal Problem:   DKA, type 1 ([place]) Active Problems:   Altered mental status  Resolved Problems:   * No resolved hospital problems. *  Hospital Course: HPI on admission: "Jesus Ewing is a 21 y.o. male with medical history significant of Diabetes type 1, left foot amputation, celiac disease who presents due to feeling unwell, feeling he is in DKA.   Jesus Ewing is very fatigued, but answering questions.  He notes no recent illness, no change in medications.  He does note that he is taking 50 units of insulin glargine at night and his roommate is helping him with his short acting insulin, so he is not sure what he is taking.  His stepfather who supplies some of the history reports that they are not sure if he is taking any short acting insulin.  Jesus Ewing reports no intake all day and no fluids for at least 18 hours.  He feels cold and dehydrated.  He notes lower abdominal pain and no urination for the last few hours.  He further notes about 2 months of aching leg pain bilaterally.  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  He denies chest pain, SOB, fever, recent illness, nausea, vomiting.    ED Course: In the ED, he was noted to be acidotic with a pH of 7.17, AG is elevated and BHB is elevated.  He has a CO2 of 13.  Urine studies are ordered, but he has not urinated yet.  CT head did not show any acute abnormality.    Review of Systems: As per HPI otherwise all  other systems reviewed and are negative."   Patient was placed on insulin drip per DKA protocol. Anion gap closed and acidosis markedly improved.  Pt seen in ED holding for a bed this AM.  He was sleeping and barely woke up to speak with me.  His only questions were if he could eat and if he had to stay another night.  I informed patient we are just now transitioning off insulin drip, need to monitor sugars and adjust insulin.  He expressed understanding and agreed.  Afternoon --  Patient signed out against medical advice this afternoon before repeat labs could be obtained.  He was informed of risk of leaving the hospital including morbidity and mortality.  He is fully alert & oriented, capable to make his decisions, expressed understanding, signed AMA form and left the hospital.          Consultants: none Procedures performed: none  Disposition: Home Diet recommendation:  Carb modified diet   DISCHARGE MEDICATION: No discharge medication reconciliation was performed because patient signed out [place].  Resume prior insulin regimen and close outpatient follow up.   Discharge Exam: Filed Weights   06/03/22 1728  Weight: 67.1 kg   General exam: sleeping, slightly difficult to arouse, no acute distress HEENT: oist mucus membranes, hearing grossly normal  Respiratory system: CTAB, no wheezes, rales or rhonchi, normal respiratory effort. Cardiovascular system: normal S1/S2,  RRR, no pedal edema.   Gastrointestinal system: soft, NT, ND, no HSM felt, +bowel sounds. Central nervous system: A&O x3. no gross focal neurologic deficits, normal speech Extremities: moves all, no edema, normal tone Skin: dry, intact, normal temperature Psychiatry: normal mood, flat affect   Condition at discharge: stable  The results of significant diagnostics from this hospitalization (including imaging, microbiology, ancillary and laboratory) are listed below for reference.    Imaging Studies: DG Foot 2 Views Right  Result Date: 06/04/2022 CLINICAL DATA:  Toe pain EXAM: RIGHT FOOT - 2 VIEW COMPARISON:  None Available. FINDINGS: Status post amputation of the great toe at the distal part of the proximal phalanx. No acute abnormality. No evidence of osteomyelitis IMPRESSION: Status post amputation of the great toe at the distal part of the proximal phalanx. Electronically Signed   By: Ulyses Jarred M.D.   On: 06/04/2022 02:55   CT Head Wo Contrast  Result Date: 06/03/2022 CLINICAL DATA:  Altered mental status. EXAM: CT HEAD WITHOUT CONTRAST TECHNIQUE: Contiguous axial images were obtained from the base of the skull through the vertex without intravenous contrast. RADIATION DOSE REDUCTION: This exam was performed according to the departmental dose-optimization program which includes automated exposure control, adjustment of the mA and/or kV according to patient size and/or use of iterative reconstruction technique. COMPARISON:  MRI Brain 02/14/21, CT Brain 02/14/21 FINDINGS: Brain: Redemonstrated hyperdense lesion in the left frontal lobe compatible with a cavernous malformation, not significantly changed in size compared to prior CT brain. No CT evidence of acute infarct. No new sites of hemorrhage. Unchanged appearance of the ventricular system. No extra-axial fluid collection. Vascular: No hyperdense vessel or unexpected calcification. Skull: Normal. Negative for fracture or focal lesion. Sinuses/Orbits: No acute finding. Other: None. IMPRESSION: 1. No acute intracranial process. No CT finding to explain patient's ocular symptoms. 2. 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 Electronically Signed   By: Marin Roberts M.D.   On: 06/03/2022 19:10   DG Chest 1 View  Result Date: 06/03/2022 CLINICAL DATA:  Weakness, short of breath EXAM: CHEST  1 VIEW COMPARISON:  None Available. FINDINGS: Normal  mediastinum and cardiac silhouette. Normal pulmonary vasculature. No evidence of effusion, infiltrate, or pneumothorax. No acute bony abnormality. Irregular density projecting over the LEFT upper lobe measuring 10 mm. This could represent a misshapen electrode lead. No wires leading to this density. IMPRESSION: 1. Density projecting LEFT upper lobe representing a pulmonary nodule versus electrode lead. Recommend clinical correlation. 2. Otherwise no acute cardiopulmonary findings. Electronically Signed   By: Suzy Bouchard M.D.   On: 06/03/2022 18:17    Microbiology: Results for orders placed or performed during the hospital encounter of 06/03/22  Resp Panel by RT-PCR (Flu A&B, Covid) Anterior Nasal Swab     Status: None   Collection Time: 06/03/22  5:29 PM   Specimen: Anterior Nasal Swab  Result Value Ref Range Status   SARS Coronavirus 2 by RT PCR NEGATIVE NEGATIVE Final    Comment: (NOTE) SARS-CoV-2 target nucleic acids are NOT DETECTED.  The SARS-CoV-2 RNA is generally detectable in upper respiratory specimens during the acute phase of infection. The lowest concentration of SARS-CoV-2 viral copies this assay can detect is 138 copies/mL. A negative result does not preclude SARS-Cov-2 infection and should not be used as the sole basis for treatment or other patient management decisions. A negative result may occur with  improper specimen collection/handling, submission of specimen  other than nasopharyngeal swab, presence of viral mutation(s) within the areas targeted by this assay, and inadequate number of viral copies(<138 copies/mL). A negative result must be combined with clinical observations, patient history, and epidemiological information. The expected result is Negative.  Fact Sheet for Patients:  BloggerCourse.com  Fact Sheet for Healthcare Providers:  SeriousBroker.it  This test is no t yet approved or cleared by the [place] FDA and  has been authorized for detection and/or diagnosis of SARS-CoV-2 by FDA under an Emergency Use Authorization (EUA). This EUA will remain  in effect (meaning this test can be used) for the duration of the COVID-19 declaration under Section 564(b)(1) of the Act, 21 U.S.C.section 360bbb-3(b)(1), unless the authorization is terminated  or revoked sooner.       Influenza A by PCR NEGATIVE NEGATIVE Final   Influenza B by PCR NEGATIVE NEGATIVE Final    Comment: (NOTE) The Xpert Xpress SARS-CoV-2/FLU/RSV plus assay is intended as an aid in the diagnosis of influenza from Nasopharyngeal swab specimens and should not be used as a sole basis for treatment. Nasal washings and aspirates are unacceptable for Xpert Xpress SARS-CoV-2/FLU/RSV testing.  Fact Sheet for Patients: BloggerCourse.com  Fact Sheet for Healthcare Providers: SeriousBroker.it  This test is not yet approved or cleared by the [place] FDA and has been authorized for detection and/or diagnosis of SARS-CoV-2 by FDA under an Emergency Use Authorization (EUA). This EUA will remain in effect (meaning this test can be used) for the duration of the COVID-19 declaration under Section 564(b)(1) of the Act, 21 U.S.C. section 360bbb-3(b)(1), unless the authorization is terminated or revoked.  Performed at [facility], [address] [place], [place] [address]     Labs: CBC: Recent Labs  Lab 05/29/22 1358 06/03/22 1729 06/04/22 0054 06/04/22 0459  WBC 5.3 8.2 8.2 7.3  NEUTROABS 3.2 4.1  --   --   HGB 15.4 19.1* 15.1 14.8  HCT 47.0 54.1* 43.3 42.5  MCV 97 89.7 91.5 90.2  PLT 305 398 284 288   Basic Metabolic Panel: Recent Labs  Lab 05/29/22 1358 06/03/22 1729 06/03/22 1925 06/04/22 0054 06/04/22 0459  NA 131* 134* 134* 134* 135  K 5.5* 4.7 4.3 3.4* 3.9  CL 93* 100 108 109 107  CO2 23 13* 8* 11* 19*  GLUCOSE 638* 142* 154* 168*  127*  BUN 22* 28* 25* 22* 20  CREATININE 0.83 1.00 0.82 0.87 0.80  CALCIUM 9.3 9.5 7.6* 8.0* 7.7*  MG  --   --  1.8  --   --   PHOS  --   --  3.4  --   --    Liver Function Tests: Recent Labs  Lab 05/29/22 1358 06/03/22 1729  AST 21 22  ALT 39 44  ALKPHOS 137* 150*  BILITOT 0.4 2.3*  PROT 6.2 8.6*  ALBUMIN 4.3 4.5   CBG: Recent Labs  Lab 06/04/22 0806 06/04/22 1004 06/04/22 1101 06/04/22 1208 06/04/22 1322  GLUCAP 113* 146* 168* 157* 224*    Discharge time spent: greater than 30 minutes.  Signed: Pennie Banter, DO [facility] 06/04/2022

## 2022-06-04 NOTE — ED Notes (Signed)
Pt sts that he wants to leave AMA. RN notified provider of pt decision. RN went over [facility] form with risks and benefits up to including death. Pt verbalized and understood risks and benefits.

## 2022-06-04 NOTE — Progress Notes (Signed)
       CROSS COVER NOTE  NAME: Jesus Ewing MRN: [id number] DOB : Aug 23, 2000    Date of Service   06/03/2022  HPI/Events of Note   Patient  with Type 1 DM admitted for non hyperglycemic DKA. + meth on UDS. Suspect acidosis is multifactorial  Assessment and  Interventions   Assessment:  Latest Reference Range & Units 06/03/22 17:29 06/03/22 19:25 06/04/22 00:54  BASIC METABOLIC PANEL   Rpt ! Rpt !  COMPREHENSIVE METABOLIC PANEL  Rpt !    Sodium 135 - 145 mmol/L 134 (L) 134 (L) 134 (L)  Potassium 3.5 - 5.1 mmol/L 4.7 4.3 3.4 (L)  Chloride 98 - 111 mmol/L 100 108 109  CO2 22 - 32 mmol/L 13 (L) 8 (L) 11 (L)  Glucose 70 - 99 mg/dL 737 (H) 106 (H) 269 (H)  BUN 6 - 20 mg/dL 28 (H) 25 (H) 22 (H)  Creatinine 0.61 - 1.24 mg/dL 4.85 4.62 7.03  Calcium 8.9 - 10.3 mg/dL 9.5 7.6 (L) 8.0 (L)  Anion gap 5 - 15  21 (H) 18 (H) 14  Phosphorus 2.5 - 4.6 mg/dL  3.4   Magnesium 1.7 - 2.4 mg/dL  1.8   Alkaline Phosphatase 38 - 126 U/L 150 (H)    Albumin 3.5 - 5.0 g/dL 4.5    AST 15 - 41 U/L 22    ALT 0 - 44 U/L 44    Total Protein 6.5 - 8.1 g/dL 8.6 (H)    Total Bilirubin 0.3 - 1.2 mg/dL 2.3 (H)    GFR, Estimated >60 mL/min >60 >60 >60  !: Data is abnormal (L): Data is abnormally low (H): Data is abnormally high Rpt: View report in Results Review for more information   Latest Reference Range & Units 06/03/22 17:23 06/03/22 21:28 06/04/22 00:54  pH, Ven 7.25 - 7.43  7.17 (LL) 7.13 (LL) 7.2 (L)  pCO2, Ven 44 - 60 mmHg 37 (L) 38 (L) 30 (L)  pO2, Ven 32 - 45 mmHg PENDING 40 57 (H)  Acid-base deficit 0.0 - 2.0 mmol/L 14.2 (H) 15.8 (H) 15.0 (H)  Bicarbonate 20.0 - 28.0 mmol/L 13.5 (L) 12.6 (L) 11.7 (L)  O2 Saturation % 47.7 69.3 87.7  Patient temperature  37.0 37.0 37.0  Collection site  VENOUS VEIN VEIN  (LL): Data is critically low (L): Data is abnormally low (H): Data is abnormally high Plan: Patient was given additional IV fluids improving but not correcting acidosis and severe  bicarb deficit - bicarb drip initiated Follow up labs Chest xray with increased density seen in LUL on xray. Will need follow up imaging       Donnie Mesa NP [facility]

## 2022-06-06 LAB — BLOOD GAS, VENOUS
Acid-base deficit: 14.2 mmol/L — ABNORMAL HIGH (ref 0.0–2.0)
Bicarbonate: 13.5 mmol/L — ABNORMAL LOW (ref 20.0–28.0)
O2 Saturation: 47.7 %
Patient temperature: 37
pCO2, Ven: 37 mmHg — ABNORMAL LOW (ref 44–60)
pH, Ven: 7.17 — CL (ref 7.25–7.43)

## 2022-07-13 ENCOUNTER — Ambulatory Visit: Payer: 59 | Admitting: Student in an Organized Health Care Education/Training Program

## 2022-07-26 ENCOUNTER — Ambulatory Visit: Payer: 59 | Admitting: Podiatry

## 2022-08-14 ENCOUNTER — Ambulatory Visit: Payer: 59 | Admitting: Podiatry

## 2022-09-14 ENCOUNTER — Ambulatory Visit: Payer: 59 | Admitting: Student in an Organized Health Care Education/Training Program

## 2022-11-27 ENCOUNTER — Ambulatory Visit (INDEPENDENT_AMBULATORY_CARE_PROVIDER_SITE_OTHER): Payer: 59

## 2022-11-27 ENCOUNTER — Ambulatory Visit: Payer: 59 | Admitting: Podiatry

## 2022-11-27 DIAGNOSIS — L03031 Cellulitis of right toe: Secondary | ICD-10-CM | POA: Diagnosis not present

## 2022-11-27 DIAGNOSIS — M7662 Achilles tendinitis, left leg: Secondary | ICD-10-CM | POA: Diagnosis not present

## 2022-11-27 DIAGNOSIS — G8929 Other chronic pain: Secondary | ICD-10-CM

## 2022-11-27 DIAGNOSIS — Z89419 Acquired absence of unspecified great toe: Secondary | ICD-10-CM

## 2022-11-27 DIAGNOSIS — M79672 Pain in left foot: Secondary | ICD-10-CM | POA: Diagnosis not present

## 2022-11-27 DIAGNOSIS — M792 Neuralgia and neuritis, unspecified: Secondary | ICD-10-CM

## 2022-11-27 DIAGNOSIS — L02611 Cutaneous abscess of right foot: Secondary | ICD-10-CM

## 2022-11-27 NOTE — Progress Notes (Signed)
  Subjective:  Patient ID: Jesus Ewing, male    DOB: 14-May-2001,  MRN: [id number]  Chief Complaint  Patient presents with   Toe Pain    Amputated toe is painful to walk on    22 y.o. male presents with the above complaint. History confirmed with patient.  He continues to have chronic pain in both feet both at the amputation site of the right great toe partial and throughout the arch on the left foot  Objective:  Physical Exam: warm, good capillary refill, no trophic changes or ulcerative lesions, normal DP and PT pulses, and abnormal sensory exam, loss of protective sensation, he has neuropathic pain and paresthesias, there is no edema or pain in the midfoot of the right foot, no gross deformity no rocker-bottom and no ulceration.  Sensitivity in the plantar arch of the left foot.  Significant sensitivity at the amputation margin of the right hallux   Radiographs: Multiple views x-ray of the both feet show no change in alignment, no fragmentation or signs of Charcot arthropathy, no heterotopic bone formation at amputation margin Assessment:   1. Achilles tendinitis of left lower extremity   2. History of amputation of great toe (HCC)   3. Chronic neuropathic pain   4. Cellulitis and abscess of toe of right foot   5. Pain in left foot      Plan:  Patient was evaluated and treated and all questions answered.  I previously sent pain clinic referral for him for evaluation and recommendations.  He said that he did not go because he was worried about drug testing.  Encouraged him to reschedule this, do not think that long-term narcotic medication management will be helpful anyway, as long as he is able to be honest about what his drug use habits are hopefully can at least get some management recommendations.  He also has a prescription for gabapentin that he has not been taking.  He will take this for pain control currently.  Return as needed.  No evidence of Charcot at this  point.  Return if symptoms worsen or fail to improve.

## 2023-03-07 ENCOUNTER — Ambulatory Visit: Payer: 59 | Admitting: Podiatry

## 2023-03-28 ENCOUNTER — Ambulatory Visit: Payer: 59 | Admitting: Podiatry

## 2023-03-28 ENCOUNTER — Encounter: Payer: Self-pay | Admitting: Podiatry

## 2023-03-28 ENCOUNTER — Ambulatory Visit (INDEPENDENT_AMBULATORY_CARE_PROVIDER_SITE_OTHER): Payer: 59

## 2023-03-28 DIAGNOSIS — M792 Neuralgia and neuritis, unspecified: Secondary | ICD-10-CM | POA: Diagnosis not present

## 2023-03-28 DIAGNOSIS — Z89419 Acquired absence of unspecified great toe: Secondary | ICD-10-CM

## 2023-03-28 DIAGNOSIS — M2041 Other hammer toe(s) (acquired), right foot: Secondary | ICD-10-CM

## 2023-03-28 DIAGNOSIS — M2042 Other hammer toe(s) (acquired), left foot: Secondary | ICD-10-CM

## 2023-03-28 DIAGNOSIS — M14671 Charcot's joint, right ankle and foot: Secondary | ICD-10-CM

## 2023-03-28 DIAGNOSIS — G8929 Other chronic pain: Secondary | ICD-10-CM

## 2023-03-28 MED ORDER — GABAPENTIN 300 MG PO CAPS: ORAL_CAPSULE | ORAL | 0 refills | Status: DC

## 2023-03-28 NOTE — Progress Notes (Signed)
  Subjective:  Patient ID: Jesus Ewing, male    DOB: 12/26/2000,  MRN: [id number]  Chief Complaint  Patient presents with   Foot Pain    "I have discomfort in my right foot and I'm not able to move my toes.  The left foot, I can't walk flat on it anymore, there's too uncomfortable." N - toes won't move right,  can't walk flat -left L - 2-5 digits right, lateral plantar pain left D - right - 2 weeks, left - 1-2 mos O - suddenly, gradually worse C - toes feel like they are on fire - burning, Diabetic / left - sharp pain constantly A - walking, turn foot a certain way T - Gabapentin    22 y.o. male presents with the above complaint. History confirmed with patient.  He continues to have chronic pain in both feet both at the amputation site of the right great toe partial and throughout the arch on the left foot.  He ran out of gabapentin a week or so ago.  The pain certainly worse 4 to 5 weeks ago Objective:  Physical Exam: warm, good capillary refill, no trophic changes or ulcerative lesions, normal DP and PT pulses, and abnormal sensory exam, loss of protective sensation, he has neuropathic pain and paresthesias, there is no edema or pain in the midfoot of the right foot, no gross deformity no rocker-bottom and no ulceration.  Sensitivity in the plantar arch of the left foot.  Significant sensitivity at the amputation margin of the right hallux.  No evidence of new Charcot flare either foot   Radiographs: Multiple views x-ray of the both feet show no change in alignment, no fragmentation or signs of Charcot arthropathy, no heterotopic bone formation at amputation margin Assessment:   1. Hammer toe of right foot   2. Hammer toe of left foot   3. Chronic neuropathic pain   4. History of amputation of great toe (HCC)   5. Charcot's joint of right foot      Plan:  Patient was evaluated and treated and all questions answered.  Fortunately no evidence of new Charcot flare in  either foot.  Most of his pain I suspect likely is secondary to his chronic neuropathic pain.  We again discussed the importance of lowering his A1c which I think greatly would improve his symptoms if he can lower his A1c to 8% or so.  I did refill his gabapentin and discussed that we can increase his dosing to 600 mg twice daily and then in 1 month increase to 600 mg 3 times daily.  Hopefully should offer increased relief.  We also again discussed pain management, he is going to get me contact info for the practice that his mother goes to and would like to go there and I can fax the referral and I have the information.  Return if symptoms worsen or fail to improve.

## 2023-04-05 ENCOUNTER — Other Ambulatory Visit: Payer: Self-pay

## 2023-04-05 ENCOUNTER — Emergency Department: Payer: 59

## 2023-04-05 ENCOUNTER — Observation Stay
Admission: EM | Admit: 2023-04-05 | Discharge: 2023-04-06 | Disposition: A | Discharge status: Home / Self Care | Payer: 59 | Attending: Internal Medicine | Admitting: Internal Medicine

## 2023-04-05 DIAGNOSIS — F17210 Nicotine dependence, cigarettes, uncomplicated: Secondary | ICD-10-CM | POA: Insufficient documentation

## 2023-04-05 DIAGNOSIS — R739 Hyperglycemia, unspecified: Secondary | ICD-10-CM | POA: Diagnosis present

## 2023-04-05 DIAGNOSIS — E10610 Type 1 diabetes mellitus with diabetic neuropathic arthropathy: Secondary | ICD-10-CM | POA: Insufficient documentation

## 2023-04-05 DIAGNOSIS — F332 Major depressive disorder, recurrent severe without psychotic features: Secondary | ICD-10-CM | POA: Insufficient documentation

## 2023-04-05 DIAGNOSIS — Z79899 Other long term (current) drug therapy: Secondary | ICD-10-CM | POA: Insufficient documentation

## 2023-04-05 DIAGNOSIS — R45851 Suicidal ideations: Secondary | ICD-10-CM | POA: Insufficient documentation

## 2023-04-05 DIAGNOSIS — F411 Generalized anxiety disorder: Secondary | ICD-10-CM | POA: Insufficient documentation

## 2023-04-05 DIAGNOSIS — Z1152 Encounter for screening for COVID-19: Secondary | ICD-10-CM | POA: Insufficient documentation

## 2023-04-05 DIAGNOSIS — Z89432 Acquired absence of left foot: Secondary | ICD-10-CM | POA: Insufficient documentation

## 2023-04-05 DIAGNOSIS — Z7982 Long term (current) use of aspirin: Secondary | ICD-10-CM | POA: Diagnosis not present

## 2023-04-05 DIAGNOSIS — E1065 Type 1 diabetes mellitus with hyperglycemia: Secondary | ICD-10-CM | POA: Diagnosis not present

## 2023-04-05 DIAGNOSIS — Z560 Unemployment, unspecified: Secondary | ICD-10-CM | POA: Insufficient documentation

## 2023-04-05 DIAGNOSIS — E1010 Type 1 diabetes mellitus with ketoacidosis without coma: Principal | ICD-10-CM | POA: Diagnosis present

## 2023-04-05 DIAGNOSIS — E1310 Other specified diabetes mellitus with ketoacidosis without coma: Principal | ICD-10-CM

## 2023-04-05 DIAGNOSIS — Z794 Long term (current) use of insulin: Secondary | ICD-10-CM | POA: Insufficient documentation

## 2023-04-05 DIAGNOSIS — E1110 Type 2 diabetes mellitus with ketoacidosis without coma: Secondary | ICD-10-CM | POA: Diagnosis present

## 2023-04-05 LAB — URINALYSIS, ROUTINE W REFLEX MICROSCOPIC
Bilirubin Urine: NEGATIVE
Glucose, UA: >=500 mg/dL — AB
Hgb urine dipstick: NEGATIVE
Ketones, ur: 80 mg/dL — AB
Leukocytes,Ua: NEGATIVE
Nitrite: NEGATIVE
Protein, ur: NEGATIVE mg/dL
RBC / HPF: 0 RBC/hpf (ref 0–5)
Specific Gravity, Urine: 1.021 (ref 1.005–1.030)
pH: 5 (ref 5.0–8.0)

## 2023-04-05 LAB — BASIC METABOLIC PANEL
Anion gap: 23 — ABNORMAL HIGH (ref 5–15)
Anion gap: 13 (ref 5–15)
Anion gap: 10 (ref 5–15)
BUN: 27 mg/dL — ABNORMAL HIGH (ref 6–20)
BUN: 18 mg/dL (ref 6–20)
BUN: 7 mg/dL (ref 6–20)
CO2: 9 mmol/L — ABNORMAL LOW (ref 22–32)
CO2: 11 mmol/L — ABNORMAL LOW (ref 22–32)
CO2: 22 mmol/L (ref 22–32)
Calcium: 8.7 mg/dL — ABNORMAL LOW (ref 8.9–10.3)
Calcium: 7.1 mg/dL — ABNORMAL LOW (ref 8.9–10.3)
Calcium: 8.7 mg/dL — ABNORMAL LOW (ref 8.9–10.3)
Chloride: 97 mmol/L — ABNORMAL LOW (ref 98–111)
Chloride: 107 mmol/L (ref 98–111)
Chloride: 104 mmol/L (ref 98–111)
Creatinine, Ser: 0.93 mg/dL (ref 0.61–1.24)
Creatinine, Ser: 0.56 mg/dL — ABNORMAL LOW (ref 0.61–1.24)
Creatinine, Ser: 0.46 mg/dL — ABNORMAL LOW (ref 0.61–1.24)
GFR, Estimated: >60 mL/min (ref >60)
GFR, Estimated: >60 mL/min (ref >60)
GFR, Estimated: >60 mL/min (ref >60)
Glucose, Bld: 363 mg/dL — ABNORMAL HIGH (ref 70–99)
Glucose, Bld: 189 mg/dL — ABNORMAL HIGH (ref 70–99)
Glucose, Bld: 99 mg/dL (ref 70–99)
Potassium: 4.5 mmol/L (ref 3.5–5.1)
Potassium: 4 mmol/L (ref 3.5–5.1)
Potassium: 3.4 mmol/L — ABNORMAL LOW (ref 3.5–5.1)
Sodium: 129 mmol/L — ABNORMAL LOW (ref 135–145)
Sodium: 131 mmol/L — ABNORMAL LOW (ref 135–145)
Sodium: 136 mmol/L (ref 135–145)

## 2023-04-05 LAB — CBC
HCT: 53.3 % — ABNORMAL HIGH (ref 39.0–52.0)
Hemoglobin: 18.8 g/dL — ABNORMAL HIGH (ref 13.0–17.0)
MCH: 31.6 pg (ref 26.0–34.0)
MCHC: 35.3 g/dL (ref 30.0–36.0)
MCV: 89.6 fL (ref 80.0–100.0)
Platelets: 358 10*3/uL (ref 150–400)
RBC: 5.95 MIL/uL — ABNORMAL HIGH (ref 4.22–5.81)
RDW: 11.6 % (ref 11.5–15.5)
WBC: 11 10*3/uL — ABNORMAL HIGH (ref 4.0–10.5)
nRBC: 0 % (ref 0.0–0.2)

## 2023-04-05 LAB — PHOSPHORUS: Phosphorus: 3.3 mg/dL (ref 2.5–4.6)

## 2023-04-05 LAB — MAGNESIUM: Magnesium: 1.7 mg/dL (ref 1.7–2.4)

## 2023-04-05 LAB — CBG MONITORING, ED
Glucose-Capillary: 356 mg/dL — ABNORMAL HIGH (ref 70–99)
Glucose-Capillary: 259 mg/dL — ABNORMAL HIGH (ref 70–99)
Glucose-Capillary: 243 mg/dL — ABNORMAL HIGH (ref 70–99)
Glucose-Capillary: 212 mg/dL — ABNORMAL HIGH (ref 70–99)
Glucose-Capillary: 188 mg/dL — ABNORMAL HIGH (ref 70–99)
Glucose-Capillary: 198 mg/dL — ABNORMAL HIGH (ref 70–99)
Glucose-Capillary: 133 mg/dL — ABNORMAL HIGH (ref 70–99)
Glucose-Capillary: 138 mg/dL — ABNORMAL HIGH (ref 70–99)

## 2023-04-05 LAB — BETA-HYDROXYBUTYRIC ACID
Beta-Hydroxybutyric Acid: 5.53 mmol/L — ABNORMAL HIGH (ref 0.05–0.27)
Beta-Hydroxybutyric Acid: >8 mmol/L — ABNORMAL HIGH (ref 0.05–0.27)
Beta-Hydroxybutyric Acid: 2.75 mmol/L — ABNORMAL HIGH (ref 0.05–0.27)

## 2023-04-05 LAB — SARS CORONAVIRUS 2 BY RT PCR: SARS Coronavirus 2 by RT PCR: NEGATIVE

## 2023-04-05 MED ORDER — POTASSIUM CHLORIDE 10 MEQ/100ML IV SOLN
INTRAVENOUS | Status: AC
  Administered 2023-04-05: 10 meq via INTRAVENOUS
  Filled 2023-04-05: qty 100

## 2023-04-05 MED ORDER — LACTATED RINGERS IV SOLN: INTRAVENOUS | Status: DC

## 2023-04-05 MED ORDER — LACTATED RINGERS IV BOLUS
1350.0000 mL | Freq: Once | INTRAVENOUS | Status: AC
  Administered 2023-04-05: 1350 mL via INTRAVENOUS

## 2023-04-05 MED ORDER — INSULIN ASPART 100 UNIT/ML IJ SOLN: 10.0000 [IU] | Freq: Three times a day (TID) | INTRAMUSCULAR | Status: DC

## 2023-04-05 MED ORDER — LACTATED RINGERS IV BOLUS
1000.0000 mL | Freq: Once | INTRAVENOUS | Status: AC
  Administered 2023-04-05: 1000 mL via INTRAVENOUS

## 2023-04-05 MED ORDER — DEXTROSE IN LACTATED RINGERS 5 % IV SOLN: INTRAVENOUS | Status: DC

## 2023-04-05 MED ORDER — LEVETIRACETAM 500 MG PO TABS
500.0000 mg | ORAL_TABLET | Freq: Two times a day (BID) | ORAL | Status: DC
  Administered 2023-04-05 (×2): 500 mg via ORAL
  Filled 2023-04-05 (×2): qty 1

## 2023-04-05 MED ORDER — INSULIN GLARGINE-YFGN 100 UNIT/ML ~~LOC~~ SOLN
36.0000 [IU] | Freq: Every day | SUBCUTANEOUS | Status: DC
  Administered 2023-04-05: 36 [IU] via SUBCUTANEOUS
  Filled 2023-04-05: qty 0.36

## 2023-04-05 MED ORDER — GABAPENTIN 100 MG PO CAPS
200.0000 mg | ORAL_CAPSULE | Freq: Two times a day (BID) | ORAL | Status: DC
  Administered 2023-04-05 (×2): 200 mg via ORAL
  Filled 2023-04-05 (×2): qty 2

## 2023-04-05 MED ORDER — INSULIN GLARGINE-YFGN 100 UNIT/ML ~~LOC~~ SOLN
55.0000 [IU] | Freq: Every day | SUBCUTANEOUS | Status: DC
  Filled 2023-04-05: qty 0.55

## 2023-04-05 MED ORDER — INSULIN ASPART 100 UNIT/ML IJ SOLN
10.0000 [IU] | Freq: Three times a day (TID) | INTRAMUSCULAR | Status: DC
  Administered 2023-04-06: 10 [IU] via SUBCUTANEOUS
  Filled 2023-04-05: qty 1

## 2023-04-05 MED ORDER — NICOTINE 7 MG/24HR TD PT24
7.0000 mg | MEDICATED_PATCH | Freq: Every day | TRANSDERMAL | Status: DC
  Filled 2023-04-05 (×2): qty 1

## 2023-04-05 MED ORDER — INSULIN GLARGINE-YFGN 100 UNIT/ML ~~LOC~~ SOLN: 55.0000 [IU] | Freq: Every day | SUBCUTANEOUS | Status: DC

## 2023-04-05 MED ORDER — POTASSIUM CHLORIDE 10 MEQ/100ML IV SOLN
10.0000 meq | INTRAVENOUS | Status: AC
  Administered 2023-04-05: 10 meq via INTRAVENOUS
  Filled 2023-04-05 (×2): qty 100

## 2023-04-05 MED ORDER — MAGNESIUM OXIDE -MG SUPPLEMENT 400 (240 MG) MG PO TABS
400.0000 mg | ORAL_TABLET | Freq: Once | ORAL | Status: AC
  Administered 2023-04-05: 400 mg via ORAL
  Filled 2023-04-05: qty 1

## 2023-04-05 MED ORDER — INSULIN REGULAR(HUMAN) IN NACL 100-0.9 UT/100ML-% IV SOLN
INTRAVENOUS | Status: DC
  Administered 2023-04-05: 7.5 [IU]/h via INTRAVENOUS
  Filled 2023-04-05: qty 100

## 2023-04-05 MED ORDER — DEXTROSE 50 % IV SOLN: 0.0000 mL | INTRAVENOUS | Status: DC | PRN

## 2023-04-05 MED ORDER — INSULIN ASPART 100 UNIT/ML IJ SOLN: 0.0000 [IU] | Freq: Every day | INTRAMUSCULAR | Status: DC

## 2023-04-05 MED ORDER — INSULIN ASPART 100 UNIT/ML IJ SOLN: 0.0000 [IU] | Freq: Three times a day (TID) | INTRAMUSCULAR | Status: DC

## 2023-04-05 MED ORDER — POTASSIUM CHLORIDE CRYS ER 20 MEQ PO TBCR
40.0000 meq | EXTENDED_RELEASE_TABLET | Freq: Once | ORAL | Status: AC
  Administered 2023-04-05: 40 meq via ORAL
  Filled 2023-04-05: qty 2

## 2023-04-05 NOTE — ED Triage Notes (Signed)
Pt comes with c/o diabetic issue. Pt assisted out of car and had to be yelled at to wake up. Pt states he took his insulin this morning but didn't know what his sugar was. Pt sleeping in triage and occasionally responding to questions.   Pt is arousal with voice and sternal rub.

## 2023-04-05 NOTE — ED Notes (Signed)
Pt able to use urinal w/o assistance.

## 2023-04-05 NOTE — H&P (Signed)
History and Physical    Jesus Ewing LKG:[id number] DOB: 2001-06-10 DOA: 04/05/2023  PCP: Pcp, No (Confirm with patient/family/NH records and if not entered, this has to be entered at [facility] point of entry) Patient coming from: Home  I have personally briefly reviewed patient's old medical records in [facility] Link  Chief Complaint: Feeling sick  HPI: Jesus Ewing is a 22 y.o. male with medical history significant of IDDM, diabetic neuropathy, left foot amputation, celiac disease this presented with DKA.  Patient runs up of his insulin on Monday.  Unable to refill his insulin, he started to feel sick next day, feeling nausea no vomiting. " I Tried to starve myself to avoid insulin use".  Feeling so weak this morning, but denied any abdominal pain feeling nausea but no vomiting no diarrhea no fever or chills.  ED Course: Afebrile, tachycardia nonhypotensive nonhypoxic.  Blood work showed glucose 363, bicarb 9, creatinine 0.9, K4.5, hemoglobin 18.8  Patient was given IV bolus of 3.3 L and started on insulin drip  Review of Systems: As per HPI otherwise 14 point review of systems negative.   Past Medical History:  Diagnosis Date   Celiac disease    Diabetes mellitus without complication [facility])     Past Surgical History:  Procedure Laterality Date   AMPUTATION TOE Right 11/10/2021   Procedure: AMPUTATION TOE;  Surgeon: Candelaria Stagers, DPM;  Location: [facility] ORS;  Service: Podiatry;  Laterality: Right;     reports that he has been smoking cigarettes and e-cigarettes. He has never used smokeless tobacco. He reports that he does not currently use alcohol. He reports that he does not currently use drugs after having used the following drugs: Cocaine.  No Known Allergies  Family History  Problem Relation Age of Onset   Healthy Mother    Diabetes Neg Hx      Prior to Admission medications   Medication Sig Start Date End Date Taking? Authorizing Provider   escitalopram (LEXAPRO) 10 MG tablet Take 10 mg by mouth daily. Patient not taking: Reported on 02/21/2022 11/22/21   [provider]  gabapentin (NEURONTIN) 300 MG capsule Take 2 capsules (600 mg total) by mouth 2 (two) times daily for 30 days, THEN 2 capsules (600 mg total) 3 (three) times daily. 03/28/23 06/26/23  McDonald, Rachelle Hora, DPM  insulin lispro (HUMALOG) 100 UNIT/ML KwikPen Inject 0-50 Units into the skin as directed. 07/24/18   [provider]  LANTUS SOLOSTAR 100 UNIT/ML Solostar Pen Inject 55 Units into the skin at bedtime. Patient not taking: Reported on 06/03/2022 05/04/19   [provider]  levETIRAcetam (KEPPRA) 500 MG tablet Take 1 tablet (500 mg total) by mouth 2 (two) times daily. 07/05/21 12/11/21  Arnetha Courser, MD  Multiple Vitamin (MULTIVITAMIN WITH MINERALS) TABS tablet Take 1 tablet by mouth daily. Patient not taking: Reported on 02/21/2022 11/12/21   Sunnie Nielsen, DO  naltrexone (DEPADE) 50 MG tablet Take 1 tablet (50 mg total) by mouth daily. Patient not taking: Reported on 02/21/2022 12/14/21   Sunnie Nielsen, DO  nicotine (NICODERM CQ - DOSED IN MG/24 HR) 7 mg/24hr patch Place 1 patch (7 mg total) onto the skin daily. Patient not taking: Reported on 04/21/2022 02/22/22   Calvert Cantor, MD  triamcinolone cream (KENALOG) 0.1 % Apply 1 Application topically 2 (two) times daily. 06/01/22   [provider]    Physical Exam: Vitals:   04/05/23 1003 04/05/23 1314 04/05/23 1400  BP: 123/79 (!) 120/94 112/72  Pulse: (!) 115 84 69  Resp: 19 20   Temp: 98.5 F (36.9 C)    SpO2: 96% 100% 100%    Constitutional: NAD, calm, comfortable Vitals:   04/05/23 1003 04/05/23 1314 04/05/23 1400  BP: 123/79 (!) 120/94 112/72  Pulse: (!) 115 84 69  Resp: 19 20   Temp: 98.5 F (36.9 C)    SpO2: 96% 100% 100%   Eyes: PERRL, lids and conjunctivae normal ENMT: Mucous membranes are dry. Posterior pharynx clear of any exudate or lesions.Normal  dentition.  Neck: normal, supple, no masses, no thyromegaly Respiratory: clear to auscultation bilaterally, no wheezing, no crackles. Normal respiratory effort. No accessory muscle use.  Cardiovascular: Regular rate and rhythm, no murmurs / rubs / gallops. No extremity edema. 2+ pedal pulses. No carotid bruits.  Abdomen: no tenderness, no masses palpated. No hepatosplenomegaly. Bowel sounds positive.  Musculoskeletal: no clubbing / cyanosis. No joint deformity upper and lower extremities. Good ROM, no contractures. Normal muscle tone.  Skin: no rashes, lesions, ulcers. No induration Neurologic: CN 2-12 grossly intact. Sensation intact, DTR normal. Strength 5/5 in all 4.  Psychiatric: Normal judgment and insight. Alert and oriented x 3. Normal mood.   Labs on Admission: I have personally reviewed following labs and imaging studies  CBC: Recent Labs  Lab 04/05/23 1004  WBC 11.0*  HGB 18.8*  HCT 53.3*  MCV 89.6  PLT 358   Basic Metabolic Panel: Recent Labs  Lab 04/05/23 1004  NA 129*  K 4.5  CL 97*  CO2 9*  GLUCOSE 363*  BUN 27*  CREATININE 0.93  CALCIUM 8.7*   GFR: CrCl cannot be calculated (Unknown ideal weight.). Liver Function Tests: No results for input(s): "AST", "ALT", "ALKPHOS", "BILITOT", "PROT", "ALBUMIN" in the last 168 hours. No results for input(s): "LIPASE", "AMYLASE" in the last 168 hours. No results for input(s): "AMMONIA" in the last 168 hours. Coagulation Profile: No results for input(s): "INR", "PROTIME" in the last 168 hours. Cardiac Enzymes: No results for input(s): "CKTOTAL", "CKMB", "CKMBINDEX", "TROPONINI" in the last 168 hours. BNP (last 3 results) No results for input(s): "PROBNP" in the last 8760 hours. HbA1C: No results for input(s): "HGBA1C" in the last 72 hours. CBG: Recent Labs  Lab 04/05/23 1001 04/05/23 1305 04/05/23 1357  GLUCAP 356* 259* 243*   Lipid Profile: No results for input(s): "CHOL", "HDL", "LDLCALC", "TRIG", "CHOLHDL",  "LDLDIRECT" in the last 72 hours. Thyroid Function Tests: No results for input(s): "TSH", "T4TOTAL", "FREET4", "T3FREE", "THYROIDAB" in the last 72 hours. Anemia Panel: No results for input(s): "VITAMINB12", "FOLATE", "FERRITIN", "TIBC", "IRON", "RETICCTPCT" in the last 72 hours. Urine analysis:    Component Value Date/Time   COLORURINE YELLOW (A) 06/03/2022 2136   APPEARANCEUR CLEAR (A) 06/03/2022 2136   LABSPEC 1.021 06/03/2022 2136   PHURINE 5.0 06/03/2022 2136   GLUCOSEU >=500 (A) 06/03/2022 2136   HGBUR NEGATIVE 06/03/2022 2136   BILIRUBINUR NEGATIVE 06/03/2022 2136   KETONESUR 80 (A) 06/03/2022 2136   PROTEINUR 30 (A) 06/03/2022 2136   NITRITE NEGATIVE 06/03/2022 2136   LEUKOCYTESUR NEGATIVE 06/03/2022 2136    Radiological Exams on Admission: DG Chest Portable 1 View  Result Date: 04/05/2023 CLINICAL DATA:  Several day history of generalized malaise, nausea, vomiting, and weakness EXAM: PORTABLE CHEST 1 VIEW COMPARISON:  Chest radiograph dated 06/03/2022 FINDINGS: Normal lung volumes. No focal consolidations. No pleural effusion or pneumothorax. The heart size and mediastinal contours are within normal limits. No acute osseous abnormality. IMPRESSION: No active disease. Electronically Signed  By: Agustin Cree M.D.   On: 04/05/2023 13:14    YQM:VHQI  Assessment/Plan Principal Problem:   DKA (diabetic ketoacidosis) (HCC) Active Problems:   Diabetic ketoacidosis without coma associated with type 1 diabetes mellitus (HCC)  (please populate well all problems here in Problem List. (For example, if patient is on BP meds at home and you resume or decide to hold them, it is a problem that needs to be her. Same for CAD, COPD, HLD and so on)   DKA -Secondary to noncoherent with insulin regimen -Probably also has element of starvation acidosis -Continue insulin drip -BMP every 4 hours -Expect out of DKA this evening and resume p.o. intake and subcu insulin to follow.   DVT  prophylaxis: SCD Code Status: Full code Family Communication: None at bedside Disposition Plan: Expect less than 2 midnight hospital stay Consults called: None Admission status: Stepdown unit observation   Emeline General MD [facility] Pager [phone]  04/05/2023, 2:50 PM

## 2023-04-05 NOTE — ED Notes (Signed)
Assumed care of pt at this time. Pt is asleep, RR and WOB WNL, pt is on CCM, VS stable and WNL. No needs identified at this time. Per previous RN, awaiting for pharmacy and hospitalist to consult about current ordered insulin dose.

## 2023-04-05 NOTE — Progress Notes (Addendum)
BMP 16:20 showed gap closed.  D/C insulin drip and Q4H lab, start insulin, Lantus 55 unit daily, Novolog 10 U TID AC and SSI.  Downgrade to Medsurg  Nurse reported that patient expressed suicidal ideation, will consult psy.

## 2023-04-05 NOTE — ED Provider Notes (Signed)
[facility] Provider Note    Event Date/Time   First MD Initiated Contact with Patient 04/05/23 1007     (approximate)   History   Blood Sugar Problem   HPI  Jesus Ewing is a 22 y.o. male presents to the ER for evaluation of generalized malaise nausea vomiting weakness since Monday night.  Patient accompanied by family member states the patient does not take care of himself.  States he has not been checking his blood sugars.  Has not been giving himself insulin.  Does have a history of type 1 diabetes and previous admission for DKA.     Physical Exam   Triage Vital Signs: ED Triage Vitals  Encounter Vitals Group     BP 04/05/23 1003 123/79     Systolic BP Percentile --      Diastolic BP Percentile --      Pulse Rate 04/05/23 1003 (!) 115     Resp 04/05/23 1003 19     Temp 04/05/23 1003 98.5 F (36.9 C)     Temp src --      SpO2 04/05/23 1003 96 %     Weight --      Height --      Head Circumference --      Peak Flow --      Pain Score 04/05/23 1001 0     Pain Loc --      Pain Education --      Exclude from Growth Chart --     Most recent vital signs: Vitals:   04/05/23 1003  BP: 123/79  Pulse: (!) 115  Resp: 19  Temp: 98.5 F (36.9 C)  SpO2: 96%     Constitutional: Alert, ill-appearing Eyes: Conjunctivae are normal.  Head: Atraumatic. Nose: No congestion/rhinnorhea. Mouth/Throat: Mucous membranes are dry Neck: Painless ROM.  Cardiovascular:   Good peripheral circulation. Respiratory: Mild tachypnea with coo small respirations. Gastrointestinal: Soft and nontender.  Musculoskeletal:  no deformity Neurologic:  MAE spontaneously. No gross focal neurologic deficits are appreciated.  Skin:  Skin is warm, dry and intact. No rash noted. Psychiatric: Mood and affect are normal. Speech and behavior are normal.    ED Results / Procedures / Treatments   Labs (all labs ordered are listed, but only abnormal results are  displayed) Labs Reviewed  BASIC METABOLIC PANEL - Abnormal; Notable for the following components:      Result Value   Sodium 129 (*)    Chloride 97 (*)    CO2 9 (*)    Glucose, Bld 363 (*)    BUN 27 (*)    Calcium 8.7 (*)    Anion gap 23 (*)    All other components within normal limits  CBC - Abnormal; Notable for the following components:   WBC 11.0 (*)    RBC 5.95 (*)    Hemoglobin 18.8 (*)    HCT 53.3 (*)    All other components within normal limits  CBG MONITORING, ED - Abnormal; Notable for the following components:   Glucose-Capillary 356 (*)    All other components within normal limits  SARS CORONAVIRUS 2 BY RT PCR  URINALYSIS, ROUTINE W REFLEX MICROSCOPIC  BETA-HYDROXYBUTYRIC ACID  BLOOD GAS, VENOUS  BASIC METABOLIC PANEL  BASIC METABOLIC PANEL  BASIC METABOLIC PANEL  BASIC METABOLIC PANEL  BETA-HYDROXYBUTYRIC ACID  BETA-HYDROXYBUTYRIC ACID  CBG MONITORING, ED  CBG MONITORING, ED  CBG MONITORING, ED     EKG   RADIOLOGY  Please see ED Course for my review and interpretation.  I personally reviewed all radiographic images ordered to evaluate for the above acute complaints and reviewed radiology reports and findings.  These findings were personally discussed with the patient.  Please see medical record for radiology report.    PROCEDURES:  Critical Care performed: Yes, see critical care procedure note(s)  .Critical Care  Performed by: Willy Eddy, MD Authorized by: Willy Eddy, MD   Critical care provider statement:    Critical care time (minutes):  45   Critical care was necessary to treat or prevent imminent or life-threatening deterioration of the following conditions:  Metabolic crisis and endocrine crisis   Critical care was time spent personally by me on the following activities:  Ordering and performing treatments and interventions, ordering and review of laboratory studies, ordering and review of radiographic studies, pulse oximetry,  re-evaluation of patient's condition, review of old charts, obtaining history from patient or surrogate, examination of patient, evaluation of patient's response to treatment, discussions with primary provider, discussions with consultants and development of treatment plan with patient or surrogate    MEDICATIONS ORDERED IN ED: Medications  insulin regular, human (MYXREDLIN) 100 units/ 100 mL infusion (has no administration in time range)  lactated ringers infusion (has no administration in time range)  dextrose 5 % in lactated ringers infusion (has no administration in time range)  dextrose 50 % solution 0-50 mL (has no administration in time range)  potassium chloride 10 mEq in 100 mL IVPB (has no administration in time range)  lactated ringers bolus 1,000 mL (1,000 mLs Intravenous New Bag/Given 04/05/23 1150)  lactated ringers bolus 1,000 mL (1,000 mLs Intravenous New Bag/Given 04/05/23 1150)     IMPRESSION / MDM / ASSESSMENT AND PLAN / ED COURSE  I reviewed the triage vital signs and the nursing notes.                              Differential diagnosis includes, but is not limited to, sepsis, electrolyte abnormality, DKA, HHS, dehydration  Patient presenting to the ER for evaluation of symptoms as described above.  Based on symptoms, risk factors and considered above differential, this presenting complaint could reflect a potentially life-threatening illness therefore the patient will be placed on continuous pulse oximetry and telemetry for monitoring.  Laboratory evaluation will be sent to evaluate for the above complaints.  Patient appears significantly dehydrated.  Have a high suspicion for DKA given his history and presentation.  Will order IV fluids for resuscitation.    Clinical Course as of 04/05/23 1212  Thu Apr 05, 2023  1109 Chest x-ray on my review and interpretation without evidence of pneumothorax or consolidation. [PR]  1210 Patient with nephric and metabolic acidosis  with high anion gap.  Potassium normal.  Have ordered insulin infusion.  Will consult hospitalist for admission. [PR]    Clinical Course User Index [PR] Willy Eddy, MD     FINAL CLINICAL IMPRESSION(S) / ED DIAGNOSES   Final diagnoses:  Diabetic ketoacidosis without coma associated with other specified diabetes mellitus (HCC)     Rx / DC Orders   ED Discharge Orders     None        Note:  This document was prepared using Dragon voice recognition software and may include unintentional dictation errors.    Willy Eddy, MD 04/05/23 1212

## 2023-04-06 ENCOUNTER — Emergency Department (HOSPITAL_COMMUNITY)
Admission: EM | Admit: 2023-04-06 | Discharge: 2023-04-07 | Disposition: A | Discharge status: Other Acute Inpatient Hospital | Payer: 59 | Source: Home / Self Care | Attending: Emergency Medicine | Admitting: Emergency Medicine

## 2023-04-06 ENCOUNTER — Other Ambulatory Visit: Payer: Self-pay

## 2023-04-06 DIAGNOSIS — E119 Type 2 diabetes mellitus without complications: Secondary | ICD-10-CM | POA: Insufficient documentation

## 2023-04-06 DIAGNOSIS — F332 Major depressive disorder, recurrent severe without psychotic features: Secondary | ICD-10-CM | POA: Diagnosis not present

## 2023-04-06 DIAGNOSIS — F411 Generalized anxiety disorder: Secondary | ICD-10-CM | POA: Insufficient documentation

## 2023-04-06 DIAGNOSIS — E1010 Type 1 diabetes mellitus with ketoacidosis without coma: Secondary | ICD-10-CM | POA: Diagnosis not present

## 2023-04-06 DIAGNOSIS — R45851 Suicidal ideations: Secondary | ICD-10-CM | POA: Insufficient documentation

## 2023-04-06 LAB — COMPREHENSIVE METABOLIC PANEL
ALT: 32 U/L (ref 0–44)
AST: 20 U/L (ref 15–41)
Albumin: 3.5 g/dL (ref 3.5–5.0)
Alkaline Phosphatase: 80 U/L (ref 38–126)
Anion gap: 10 (ref 5–15)
BUN: 23 mg/dL — ABNORMAL HIGH (ref 6–20)
CO2: 22 mmol/L (ref 22–32)
Calcium: 9.2 mg/dL (ref 8.9–10.3)
Chloride: 102 mmol/L (ref 98–111)
Creatinine, Ser: 0.64 mg/dL (ref 0.61–1.24)
GFR, Estimated: >60 mL/min (ref >60)
Glucose, Bld: 334 mg/dL — ABNORMAL HIGH (ref 70–99)
Potassium: 3.9 mmol/L (ref 3.5–5.1)
Sodium: 134 mmol/L — ABNORMAL LOW (ref 135–145)
Total Bilirubin: 0.8 mg/dL (ref 0.3–1.2)
Total Protein: 6.6 g/dL (ref 6.5–8.1)

## 2023-04-06 LAB — CBC
HCT: 45.8 % (ref 39.0–52.0)
Hemoglobin: 16.5 g/dL (ref 13.0–17.0)
MCH: 31 pg (ref 26.0–34.0)
MCHC: 36 g/dL (ref 30.0–36.0)
MCV: 86.1 fL (ref 80.0–100.0)
Platelets: 337 10*3/uL (ref 150–400)
RBC: 5.32 MIL/uL (ref 4.22–5.81)
RDW: 11.6 % (ref 11.5–15.5)
WBC: 6.5 10*3/uL (ref 4.0–10.5)
nRBC: 0 % (ref 0.0–0.2)

## 2023-04-06 LAB — HIV ANTIBODY (ROUTINE TESTING W REFLEX): HIV Screen 4th Generation wRfx: NONREACTIVE

## 2023-04-06 LAB — CBG MONITORING, ED
Glucose-Capillary: 106 mg/dL — ABNORMAL HIGH (ref 70–99)
Glucose-Capillary: 101 mg/dL — ABNORMAL HIGH (ref 70–99)
Glucose-Capillary: 374 mg/dL — ABNORMAL HIGH (ref 70–99)
Glucose-Capillary: 121 mg/dL — ABNORMAL HIGH (ref 70–99)

## 2023-04-06 LAB — SALICYLATE LEVEL: Salicylate Lvl: <7 mg/dL — ABNORMAL LOW (ref 7.0–30.0)

## 2023-04-06 LAB — ETHANOL: Alcohol, Ethyl (B): <10 mg/dL (ref <10)

## 2023-04-06 LAB — ACETAMINOPHEN LEVEL: Acetaminophen (Tylenol), Serum: <10 ug/mL — ABNORMAL LOW (ref 10–30)

## 2023-04-06 MED ORDER — GABAPENTIN 300 MG PO CAPS
600.0000 mg | ORAL_CAPSULE | Freq: Two times a day (BID) | ORAL | Status: DC
  Administered 2023-04-06 – 2023-04-07 (×3): 600 mg via ORAL
  Filled 2023-04-06 (×3): qty 2

## 2023-04-06 MED ORDER — NICOTINE 7 MG/24HR TD PT24: 7.0000 mg | MEDICATED_PATCH | Freq: Every day | TRANSDERMAL | 0 refills | Status: DC

## 2023-04-06 MED ORDER — NICOTINE 7 MG/24HR TD PT24
7.0000 mg | MEDICATED_PATCH | Freq: Every day | TRANSDERMAL | Status: DC
  Administered 2023-04-06: 7 mg via TRANSDERMAL
  Filled 2023-04-06 (×2): qty 1

## 2023-04-06 MED ORDER — ESCITALOPRAM OXALATE 10 MG PO TABS
10.0000 mg | ORAL_TABLET | Freq: Every day | ORAL | Status: DC
  Administered 2023-04-06 – 2023-04-07 (×2): 10 mg via ORAL
  Filled 2023-04-06 (×2): qty 1

## 2023-04-06 MED ORDER — INSULIN ASPART 100 UNIT/ML IJ SOLN
0.0000 [IU] | Freq: Three times a day (TID) | INTRAMUSCULAR | Status: DC
  Administered 2023-04-06: 9 [IU] via SUBCUTANEOUS
  Administered 2023-04-07: 3 [IU] via SUBCUTANEOUS
  Filled 2023-04-06 (×3): qty 1

## 2023-04-06 MED ORDER — ESCITALOPRAM OXALATE 10 MG PO TABS: 10.0000 mg | ORAL_TABLET | Freq: Every day | ORAL | 2 refills | Status: DC

## 2023-04-06 MED ORDER — GABAPENTIN 300 MG PO CAPS: 600.0000 mg | ORAL_CAPSULE | Freq: Three times a day (TID) | ORAL | Status: DC

## 2023-04-06 MED ORDER — INSULIN GLARGINE-YFGN 100 UNIT/ML ~~LOC~~ SOLN
55.0000 [IU] | Freq: Every day | SUBCUTANEOUS | Status: DC
  Filled 2023-04-06: qty 0.55

## 2023-04-06 MED ORDER — LANTUS SOLOSTAR 100 UNIT/ML ~~LOC~~ SOPN: 55.0000 [IU] | PEN_INJECTOR | Freq: Every day | SUBCUTANEOUS | 3 refills | Status: AC

## 2023-04-06 MED ORDER — LEVETIRACETAM 500 MG PO TABS
500.0000 mg | ORAL_TABLET | Freq: Two times a day (BID) | ORAL | Status: DC
  Administered 2023-04-06 – 2023-04-07 (×2): 500 mg via ORAL
  Filled 2023-04-06 (×2): qty 1

## 2023-04-06 MED ORDER — INSULIN ASPART 100 UNIT/ML IJ SOLN
6.0000 [IU] | Freq: Three times a day (TID) | INTRAMUSCULAR | Status: DC
  Administered 2023-04-06 – 2023-04-07 (×2): 6 [IU] via SUBCUTANEOUS
  Filled 2023-04-06 (×2): qty 1

## 2023-04-06 MED ORDER — INSULIN ASPART 100 UNIT/ML IJ SOLN: 0.0000 [IU] | Freq: Every day | INTRAMUSCULAR | Status: DC

## 2023-04-06 MED ORDER — ADULT MULTIVITAMIN W/MINERALS CH: 1.0000 | ORAL_TABLET | Freq: Every day | ORAL | 3 refills | Status: DC

## 2023-04-06 MED ORDER — INSULIN GLARGINE-YFGN 100 UNIT/ML ~~LOC~~ SOLN
36.0000 [IU] | Freq: Every day | SUBCUTANEOUS | Status: DC
  Administered 2023-04-06: 36 [IU] via SUBCUTANEOUS
  Filled 2023-04-06 (×2): qty 0.36

## 2023-04-06 MED ORDER — ADULT MULTIVITAMIN W/MINERALS CH
1.0000 | ORAL_TABLET | Freq: Every day | ORAL | Status: DC
  Administered 2023-04-06 – 2023-04-07 (×2): 1 via ORAL
  Filled 2023-04-06 (×2): qty 1

## 2023-04-06 MED ORDER — INSULIN LISPRO (1 UNIT DIAL) 100 UNIT/ML (KWIKPEN): 0.0000 [IU] | PEN_INJECTOR | SUBCUTANEOUS | Status: DC

## 2023-04-06 NOTE — ED Notes (Signed)
Pt A&O x4, states he does not have a legal guardian. No paperwork provided to confirm otherwise

## 2023-04-06 NOTE — Consult Note (Signed)
[facility] Face-to-Face Psychiatry Consult   Reason for Consult:   Referring Physician: Dionne Bucy, MD  Patient Identification: Jesus Ewing MRN:  [id number] Principal Diagnosis: Major depressive disorder, recurrent severe without psychotic features (HCC) Diagnosis:  Principal Problem:   Major depressive disorder, recurrent severe without psychotic features (HCC)   Total Time spent with patient: 30 minutes  Subjective:   Jesus Ewing is a 22 y.o. male patient admitted with for diabetic ketoacidosis (DKA), psych consult placed as she threatened suicide to the nurse and his mother on admission.  He was also not eating prior to admission or taking his insulin.  He claimed he did not  Jesus Ewing was insistent on discharge and starting to pull-out IV access according to the RNs. He was discharged by the Hospitalist team prior to completion of the Psychiatry Consultation and Involuntary Commitment (IVC). Jesus Ewing minimizes his depression, he did acknowledge needing to get help and had been seeking a provider. He denied suicidal ideation an homicidal ideation. Sleeping well. He denies anxiety, hallucinations, and delusions. Jesus Ewing reports working at [employer] and lives in an apartment alone. He denies access to firearms or weapons. He was pleasant and cooperative throughout the interview, maintained good eye contact. He gives permission for Psychiatry Team to call his Grandfather and Mother (although she might not be reachable by phone).  Collateral:  - Grandfather: He gifted Jesus Ewing a 9 mm gun on his 19st birthday. He asked Jesus Ewing recently if he sold the gun and he denied doing so. The Jesus Ewing is concerned about his mental health and safety, plans or getting the gun back.  He stated he is not working currently, he only worked one day, unemployed. - Mother: She provides Jesus Ewing with all his diabetic supplies and medication with no reason for him not taking his  insulin. She is concerned about his mental health and safety as he has made several threats about self-harm at home.  He also gets very upset and verbally aggressive with her and was disrespectful to his grandfather prior to admission.  Jesus Ewing made her leave his apartment and also made his girlfriend who he got upset with.  Jesus Ewing provided an inconsistent story to that of his family members. Both his Mother and Jesus Ewing are concerned about his mental health, safety, and access to a weapon. IVC was placed in order to provide a safe place to further evaluate mental health and suicidal ideation. This process was explained to the patient when he returned to the ED.  He was not happy with the decision but managed  his emotions well.  High risk factors:  young, impulsive, caucasian, lives alone, access to a weapon, depression  Past Psychiatric History: None  Risk to Self:  Yes Risk to Others:  No Prior Inpatient Therapy:  No Prior Outpatient Therapy:  No  Past Medical History:  Past Medical History:  Diagnosis Date   Celiac disease    Diabetes mellitus without complication (HCC)     Past Surgical History:  Procedure Laterality Date   AMPUTATION TOE Right 11/10/2021   Procedure: AMPUTATION TOE;  Surgeon: Candelaria Stagers, DPM;  Location: [facility] ORS;  Service: Podiatry;  Laterality: Right;   Family History:  Family History  Problem Relation Age of Onset   Healthy Mother    Diabetes Neg Hx    Family Psychiatric  History: Possible Bipolar (patient reported; Mother) Social History:  Social History   Substance and Sexual Activity  Alcohol Use Not Currently  Social History   Substance and Sexual Activity  Drug Use Not Currently   Types: Cocaine    Social History   Socioeconomic History   Marital status: Single    Spouse name: Not on file   Number of children: Not on file   Years of education: Not on file   Highest education level: Not on file  Occupational History   Not on  file  Tobacco Use   Smoking status: Every Day    Types: Cigarettes, E-cigarettes   Smokeless tobacco: Never   Tobacco comments:    Vape daily, don't smoke anymore.  Vaping Use   Vaping status: Every Day  Substance and Sexual Activity   Alcohol use: Not Currently   Drug use: Not Currently    Types: Cocaine   Sexual activity: Not on file  Other Topics Concern   Not on file  Social History Narrative   Not on file   Social Determinants of Health   Financial Resource Strain: Low Risk  (01/25/2023)   Received [facility]   Overall Financial Resource Strain (CARDIA)    Difficulty of Paying Living Expenses: Not hard at all  Food Insecurity: No Food Insecurity (01/25/2023)   Received from [facility]   Hunger Vital Sign    Worried About Running Out of Food in the Last Year: Never true    Ran Out of Food in the Last Year: Never true  Transportation Needs: Unmet Transportation Needs (01/25/2023)   Received from [facility]   PRAPARE - Transportation    In the past 12 months, has lack of transportation kept you from medical appointments or from getting medications?: Yes    Lack of Transportation (Non-Medical): Yes  Physical Activity: Inactive (08/12/2020)   Received from [facility], [facility]   Exercise Vital Sign    Days of Exercise per Week: 0 days    Minutes of Exercise per Session: 0 min  Stress: Not on file  Social Connections: Not on file   Additional Social History:lives alone    Allergies:  No Known Allergies  Labs:  Results for orders placed or performed during the hospital encounter of 04/06/23 (from the past 48 hour(s))  Comprehensive metabolic panel     Status: Abnormal   Collection Time: 04/06/23  1:52 PM  Result Value Ref Range   Sodium 134 (L) 135 - 145 mmol/L   Potassium 3.9 3.5 - 5.1 mmol/L   Chloride 102 98 - 111 mmol/L   CO2 22 22 - 32 mmol/L   Glucose, Bld 334  (H) 70 - 99 mg/dL    Comment: Glucose reference range applies only to samples taken after fasting for at least 8 hours.   BUN 23 (H) 6 - 20 mg/dL   Creatinine, Ser 1.61 0.61 - 1.24 mg/dL   Calcium 9.2 8.9 - 09.6 mg/dL   Total Protein 6.6 6.5 - 8.1 g/dL   Albumin 3.5 3.5 - 5.0 g/dL   AST 20 15 - 41 U/L   ALT 32 0 - 44 U/L   Alkaline Phosphatase 80 38 - 126 U/L   Total Bilirubin 0.8 0.3 - 1.2 mg/dL   GFR, Estimated >04 >54 mL/min    Comment: (NOTE) Calculated using the CKD-EPI Creatinine Equation (2021)    Anion gap 10 5 - 15    Comment: Performed at [facility], [address], [place], [place] [address]  Ethanol  Status: None   Collection Time: 04/06/23  1:52 PM  Result Value Ref Range   Alcohol, Ethyl (B) <10 <10 mg/dL    Comment: (NOTE) Lowest detectable limit for serum alcohol is 10 mg/dL.  For medical purposes only. Performed at [facility], [address] [place], [place] [address]   Salicylate level     Status: Abnormal   Collection Time: 04/06/23  1:52 PM  Result Value Ref Range   Salicylate Lvl <7.0 (L) 7.0 - 30.0 mg/dL    Comment: Performed at [facility], [address] [place], [place] [address]  Acetaminophen level     Status: Abnormal   Collection Time: 04/06/23  1:52 PM  Result Value Ref Range   Acetaminophen (Tylenol), Serum <10 (L) 10 - 30 ug/mL    Comment: (NOTE) Therapeutic concentrations vary significantly. A range of 10-30 ug/mL  may be an effective concentration for many patients. However, some  are best treated at concentrations outside of this range. Acetaminophen concentrations >150 ug/mL at 4 hours after ingestion  and >50 ug/mL at 12 hours after ingestion are often associated with  toxic reactions.  Performed at [facility], [address] [place], [place] [address]   cbc     Status: None   Collection Time: 04/06/23  1:52 PM  Result Value Ref Range   WBC 6.5 4.0 - 10.5 K/uL   RBC 5.32 4.22  - 5.81 MIL/uL   Hemoglobin 16.5 13.0 - 17.0 g/dL   HCT 16.6 06.3 - 01.6 %   MCV 86.1 80.0 - 100.0 fL   MCH 31.0 26.0 - 34.0 pg   MCHC 36.0 30.0 - 36.0 g/dL   RDW 01.0 93.2 - 35.5 %   Platelets 337 150 - 400 K/uL   nRBC 0.0 0.0 - 0.2 %    Comment: Performed at [facility], [address] [place], [place] [address]    Current Facility-Administered Medications  Medication Dose Route Frequency Provider Last Rate Last Admin   escitalopram (LEXAPRO) tablet 10 mg  10 mg Oral Daily Dionne Bucy, MD       gabapentin (NEURONTIN) capsule 600 mg  600 mg Oral BID Dionne Bucy, MD       Followed by   Melene Muller ON 04/27/2023] gabapentin (NEURONTIN) capsule 600 mg  600 mg Oral TID Dionne Bucy, MD       insulin aspart (novoLOG) injection 0-5 Units  0-5 Units Subcutaneous QHS Dionne Bucy, MD       insulin aspart (novoLOG) injection 0-9 Units  0-9 Units Subcutaneous TID WC Dionne Bucy, MD       insulin aspart (novoLOG) injection 6 Units  6 Units Subcutaneous TID WC Dionne Bucy, MD       levETIRAcetam (KEPPRA) tablet 500 mg  500 mg Oral BID Dionne Bucy, MD       multivitamin with minerals tablet 1 tablet  1 tablet Oral Daily Dionne Bucy, MD       nicotine (NICODERM CQ - dosed in mg/24 hr) patch 7 mg  7 mg Transdermal Daily Dionne Bucy, MD       Current Outpatient Medications  Medication Sig Dispense Refill   insulin lispro (HUMALOG) 100 UNIT/ML KwikPen Inject 0-50 Units into the skin as directed.     LANTUS SOLOSTAR 100 UNIT/ML Solostar Pen Inject 55 Units into the skin at bedtime. 15 mL 3   triamcinolone cream (KENALOG) 0.1 % Apply 1 Application topically 2 (two) times daily.  escitalopram (LEXAPRO) 10 MG tablet Take 1 tablet (10 mg total) by mouth daily. (Patient not taking: Reported on 04/06/2023) 30 tablet 2   gabapentin (NEURONTIN) 300 MG capsule Take 2 capsules (600 mg total) by mouth 2 (two) times daily for 30 days,  THEN 2 capsules (600 mg total) 3 (three) times daily. (Patient not taking: Reported on 04/06/2023) 480 capsule 0   levETIRAcetam (KEPPRA) 500 MG tablet Take 1 tablet (500 mg total) by mouth 2 (two) times daily. 60 tablet 0   Multiple Vitamin (MULTIVITAMIN WITH MINERALS) TABS tablet Take 1 tablet by mouth daily. (Patient not taking: Reported on 04/06/2023) 30 tablet 3   naltrexone (DEPADE) 50 MG tablet Take 1 tablet (50 mg total) by mouth daily. (Patient not taking: Reported on 02/21/2022) 30 tablet 0   nicotine (NICODERM CQ - DOSED IN MG/24 HR) 7 mg/24hr patch Place 1 patch (7 mg total) onto the skin daily. (Patient not taking: Reported on 04/06/2023) 28 patch 0    Musculoskeletal: Strength & Muscle Tone: within normal limits Gait & Station: normal Patient leans: N/A  Psychiatric Specialty Exam: Physical Exam Vitals and nursing note reviewed.  Constitutional:      Appearance: Normal appearance.  HENT:     Head: Normocephalic.     Nose: Nose normal.  Pulmonary:     Effort: Pulmonary effort is normal.  Musculoskeletal:        General: Normal range of motion.     Cervical back: Normal range of motion.  Neurological:     General: No focal deficit present.     Mental Status: He is alert and oriented to person, place, and time.     Review of Systems  Psychiatric/Behavioral:  Positive for depression and suicidal ideas. The patient is nervous/anxious.   All other systems reviewed and are negative.   Blood pressure 117/85, pulse (!) 126, temperature 98.3 F (36.8 C), resp. rate 17, SpO2 99%.There is no height or weight on file to calculate BMI.  General Appearance: Casual  Eye Contact:  Good  Speech:  Normal Rate  Volume:  Normal  Mood:  Irritable  Affect:  Depressed, anxious  Thought Process:  Coherent  Orientation:  Full (Time, Place, and Person)  Thought Content:  Logical  Suicidal Thoughts:  Yes with a plan  Homicidal Thoughts:  No  Memory:  Immediate;   Good Recent;    Good Remote;   Good  Judgement:  Poor  Insight:  Lacking  Psychomotor Activity:  Normal  Concentration:  Concentration: Good and Attention Span: Good  Recall:  Good  Fund of Knowledge:  Fair  Language:  Good  Akathisia:  No  Handed:  Right  AIMS (if indicated):     Assets:  Communication Skills Desire for Improvement Financial Resources/Insurance Housing Social Support  ADL's:  Intact  Cognition:  WNL  Sleep:   Good      Physical Exam: Physical Exam Vitals and nursing note reviewed.  Constitutional:      Appearance: Normal appearance.  HENT:     Head: Normocephalic.     Nose: Nose normal.  Pulmonary:     Effort: Pulmonary effort is normal.  Musculoskeletal:        General: Normal range of motion.     Cervical back: Normal range of motion.  Neurological:     General: No focal deficit present.     Mental Status: He is alert and oriented to person, place, and time.    Review of Systems  Psychiatric/Behavioral:  Positive for depression and suicidal ideas. The patient is nervous/anxious.   All other systems reviewed and are negative.  Blood pressure 117/85, pulse (!) 126, temperature 98.3 F (36.8 C), resp. rate 17, SpO2 99%. There is no height or weight on file to calculate BMI.  Treatment Plan Summary: Major depressive disorder, recurrent, severe without psychosis  - Admit to Behavioral Health Inpatient Unit; IVC suicidal ideation, further evaluate mental health status              - Declines medications, agreeable with therapy  Disposition: Recommend psychiatric Inpatient admission when medically cleared.  Nanine Means, NP 04/06/2023 4:01 PM

## 2023-04-06 NOTE — ED Notes (Signed)
Custody order scanned in computer [id number]

## 2023-04-06 NOTE — ED Notes (Signed)
IVC papers put in computer and e filed    called magistrate to have patient brought back in to ED

## 2023-04-06 NOTE — Inpatient Diabetes Management (Signed)
Inpatient Diabetes Program Recommendations  AACE/ADA: New Consensus Statement on Inpatient Glycemic Control (2015)  Target Ranges:  Prepandial:   less than 140 mg/dL      Peak postprandial:   less than 180 mg/dL (1-2 hours)      Critically ill patients:  140 - 180 mg/dL    Latest Reference Range & Units 04/05/23 15:15 04/05/23 21:10  Beta-Hydroxybutyric Acid 0.05 - 0.27 mmol/L >8.00 (H) 5.53 (H) 2.75 (H)  (H): Data is abnormally high  Latest Reference Range & Units 04/05/23 10:04  Sodium 135 - 145 mmol/L 129 (L)  Potassium 3.5 - 5.1 mmol/L 4.5  Chloride 98 - 111 mmol/L 97 (L)  CO2 22 - 32 mmol/L 9 (L)  Glucose 70 - 99 mg/dL 161 (H)  BUN 6 - 20 mg/dL 27 (H)  Creatinine 0.96 - 1.24 mg/dL 0.45  Calcium 8.9 - 40.9 mg/dL 8.7 (L)  Anion gap 5 - 15  23 (H)  (L): Data is abnormally low (H): Data is abnormally high  Latest Reference Range & Units 04/05/23 10:01 04/05/23 13:05 04/05/23 13:57 04/05/23 15:04 04/05/23 16:11 04/05/23 17:12 04/05/23 19:31 04/05/23 21:38  Glucose-Capillary 70 - 99 mg/dL 811 (H) 914 (H)  IV Insulin Drip Started 243 (H) 212 (H) 188 (H) 198 (H)  IV Insulin Drip Stopped @1738  133 (H) 138 (H)  36 units Semglee @2344   (H): Data is abnormally high    Admit with: DKA (ran out of insulin)  History: Type 1 Diabetes  Home DM Meds: Humalog 0-50 units as directed       Lantus 55 units at bedtime (NOT taking)       Dexcom G7 CGM  Current Orders: Novolog Moderate Correction Scale/ SSI (0-15 units) TID AC + HS     Novolog 10 units TID with meals     Semglee 36 units at bedtime    ENDO: Dr. Collene Schlichter with [facility] Last seen 01/25/2023 A1c at that visit was 12.8% Was instructed to take:  Lantus 55 units daily Humalog 6-7 units for 50 grams Carbs  Current A1c pending  Transitioned to SQ Insulin last PM--Looks like IV Insulin Drip was stopped hours prior to Semglee insulin being given  Novolog SSI and Meal coverage to start this AM     --Will follow  patient during hospitalization--  Jesus Finland RN, MSN, CDCES Diabetes Coordinator Inpatient Glycemic Control Team Team Pager: [phone] (8a-5p)

## 2023-04-06 NOTE — Discharge Summary (Signed)
Physician Discharge Summary   Patient: Jesus Ewing MRN: [id number] DOB: 03/02/2001  Admit date:     04/05/2023  Discharge date: 04/06/23  Discharge Physician: Marcelino Duster   PCP: Pcp, No   Recommendations at discharge:    PCP follow up in 1 week  Discharge Diagnoses: Principal Problem:   DKA (diabetic ketoacidosis) (HCC) Active Problems:   Diabetic ketoacidosis without coma associated with type 1 diabetes mellitus (HCC)   Hyperglycemia  Resolved Problems:   * No resolved hospital problems. *  Hospital Course: Jesus Ewing is a 22 y.o. male with medical history significant of IDDM, diabetic neuropathy, left foot amputation, celiac disease this presented with DKA.   Patient placed on insulin drip during the hospital stay.  His blood sugars closely monitored and insulin adjusted accordingly.  Patient's blood sugars improved and insulin drip transitioned to subcu. Patient states that he is eating fairly, does not have abdominal pain, nausea or vomiting.  Patient wishes to go home.  Per night team, RN reported patient expressed suicidal ideation.  Psychiatry team consulted.  During my exam, he denied any suicidal ideation and he states that he never told anybody that he was suicidal.  Patient refused to be seen by psychiatry.  Patient is alert awake oriented x 4.  Patient states that he follows endocrinology and has insulin prescriptions.  I did send Lantus prescription to pharmacy.  He is hemodynamically stable to be discharged home.       Consultants: None Procedures performed: none  Disposition: Home Diet recommendation:  Discharge Diet Orders (From admission, onward)     Start     Ordered   04/06/23 0000  Diet - low sodium heart healthy        04/06/23 1008           Carb modified diet DISCHARGE MEDICATION: Allergies as of 04/06/2023   No Known Allergies      Medication List     STOP taking these medications    cephALEXin 500 MG  capsule Commonly known as: KEFLEX   mupirocin ointment 2 % Commonly known as: BACTROBAN   sulfamethoxazole-trimethoprim 800-160 MG tablet Commonly known as: BACTRIM DS       TAKE these medications    escitalopram 10 MG tablet Commonly known as: LEXAPRO Take 1 tablet (10 mg total) by mouth daily.   gabapentin 300 MG capsule Commonly known as: NEURONTIN Take 2 capsules (600 mg total) by mouth 2 (two) times daily for 30 days, THEN 2 capsules (600 mg total) 3 (three) times daily. Start taking on: March 28, 2023   insulin lispro 100 UNIT/ML KwikPen Commonly known as: HUMALOG Inject 0-50 Units into the skin as directed.   Lantus SoloStar 100 UNIT/ML Solostar Pen Generic drug: insulin glargine Inject 55 Units into the skin at bedtime.   levETIRAcetam 500 MG tablet Commonly known as: KEPPRA Take 1 tablet (500 mg total) by mouth 2 (two) times daily.   multivitamin with minerals Tabs tablet Take 1 tablet by mouth daily.   naltrexone 50 MG tablet Commonly known as: DEPADE Take 1 tablet (50 mg total) by mouth daily.   nicotine 7 mg/24hr patch Commonly known as: NICODERM CQ - dosed in mg/24 hr Place 1 patch (7 mg total) onto the skin daily.   triamcinolone cream 0.1 % Commonly known as: KENALOG Apply 1 Application topically 2 (two) times daily.        Discharge Exam: Filed Weights   04/05/23 1739  Weight: 68 kg  General -young thin Caucasian male, no apparent distress HEENT - PERRLA, EOMI, atraumatic head, non tender sinuses. Lung - Clear, no rales, rhonchi or wheezes. Heart - S1, S2 heard, no murmurs, rubs, no pedal edema Neuro - Alert, awake and oriented x 3, non focal exam. Skin - Warm and dry.  Condition at discharge: stable  The results of significant diagnostics from this hospitalization (including imaging, microbiology, ancillary and laboratory) are listed below for reference.   Imaging Studies: DG Chest Portable 1 View  Result Date:  04/05/2023 CLINICAL DATA:  Several day history of generalized malaise, nausea, vomiting, and weakness EXAM: PORTABLE CHEST 1 VIEW COMPARISON:  Chest radiograph dated 06/03/2022 FINDINGS: Normal lung volumes. No focal consolidations. No pleural effusion or pneumothorax. The heart size and mediastinal contours are within normal limits. No acute osseous abnormality. IMPRESSION: No active disease. Electronically Signed   By: Agustin Cree M.D.   On: 04/05/2023 13:14   DG Foot Complete Left  Result Date: 03/28/2023 Please see detailed radiograph report in office note.  DG Foot Complete Right  Result Date: 03/28/2023 Please see detailed radiograph report in office note.   Microbiology: Results for orders placed or performed during the hospital encounter of 04/05/23  SARS Coronavirus 2 by RT PCR (hospital order, performed in [facility] hospital lab) *cepheid single result test* Anterior Nasal Swab     Status: None   Collection Time: 04/05/23 11:56 AM   Specimen: Anterior Nasal Swab  Result Value Ref Range Status   SARS Coronavirus 2 by RT PCR NEGATIVE NEGATIVE Final    Comment: (NOTE) SARS-CoV-2 target nucleic acids are NOT DETECTED.  The SARS-CoV-2 RNA is generally detectable in upper and lower respiratory specimens during the acute phase of infection. The lowest concentration of SARS-CoV-2 viral copies this assay can detect is 250 copies / mL. A negative result does not preclude SARS-CoV-2 infection and should not be used as the sole basis for treatment or other patient management decisions.  A negative result may occur with improper specimen collection / handling, submission of specimen other than nasopharyngeal swab, presence of viral mutation(s) within the areas targeted by this assay, and inadequate number of viral copies (<250 copies / mL). A negative result must be combined with clinical observations, patient history, and epidemiological information.  Fact Sheet for Patients:    RoadLapTop.co.za  Fact Sheet for Healthcare Providers: http://kim-miller.com/  This test is not yet approved or  cleared by the [place] FDA and has been authorized for detection and/or diagnosis of SARS-CoV-2 by FDA under an Emergency Use Authorization (EUA).  This EUA will remain in effect (meaning this test can be used) for the duration of the COVID-19 declaration under Section 564(b)(1) of the Act, 21 U.S.C. section 360bbb-3(b)(1), unless the authorization is terminated or revoked sooner.  Performed at [facility], [address] [place], [place] [address]     Labs: CBC: Recent Labs  Lab 04/05/23 1004  WBC 11.0*  HGB 18.8*  HCT 53.3*  MCV 89.6  PLT 358   Basic Metabolic Panel: Recent Labs  Lab 04/05/23 1004 04/05/23 1620 04/05/23 2110  NA 129* 131* 136  K 4.5 4.0 3.4*  CL 97* 107 104  CO2 9* 11* 22  GLUCOSE 363* 189* 99  BUN 27* 18 7  CREATININE 0.93 0.56* 0.46*  CALCIUM 8.7* 7.1* 8.7*  MG  --   --  1.7  PHOS  --   --  3.3   Liver Function Tests: No results for  input(s): "AST", "ALT", "ALKPHOS", "BILITOT", "PROT", "ALBUMIN" in the last 168 hours. CBG: Recent Labs  Lab 04/05/23 1712 04/05/23 1931 04/05/23 2138 04/06/23 0648 04/06/23 0838  GLUCAP 198* 133* 138* 106* 101*    Discharge time spent: 37 minutes.  Signed: Marcelino Duster, MD [facility] 04/06/2023

## 2023-04-06 NOTE — ED Notes (Signed)
Pt dressed into hospital scrubs. Pt belongings to include: 1 blue underwear 2 pink shoes 1 blue shirt 1 red shorts 2 black socks 1 cellphone 1 vap 1 pair of airpods

## 2023-04-06 NOTE — ED Provider Notes (Signed)
[facility] Provider Note    Event Date/Time   First MD Initiated Contact with Patient 04/06/23 1359     (approximate)   History   No chief complaint on file.   HPI  Jesus Ewing is a 22 y.o. male with a history of diabetes, neuropathy, celiac disease, and major depressive disorder who presents under involuntary commitment for psychiatric evaluation.  The patient had initially presented yesterday in DKA.  He was treated and admitted to the hospitalist service.  He subsequently reported SI.  Psychiatric consultation was ordered.  However, the patient was subsequently cleared by medicine and discharge.  NP Lord from psychiatry had evaluated the patient in the interim and recommended involuntary commitment and further psychiatric treatment.  The patient presents for evaluation.  He denies any SI or HI or any other acute psychiatric or physical symptoms at this time.  I reviewed the past medical records including the hospitalist H&P and discharge summary from yesterday.   Physical Exam   Triage Vital Signs: ED Triage Vitals  Encounter Vitals Group     BP 04/06/23 1352 117/85     Systolic BP Percentile --      Diastolic BP Percentile --      Pulse Rate 04/06/23 1352 (!) 126     Resp 04/06/23 1352 17     Temp 04/06/23 1352 98.3 F (36.8 C)     Temp src --      SpO2 04/06/23 1352 99 %     Weight --      Height --      Head Circumference --      Peak Flow --      Pain Score 04/06/23 1350 0     Pain Loc --      Pain Education --      Exclude from Growth Chart --     Most recent vital signs: Vitals:   04/06/23 1352  BP: 117/85  Pulse: (!) 126  Resp: 17  Temp: 98.3 F (36.8 C)  SpO2: 99%     General: Awake, no distress.  CV:  Good peripheral perfusion.  Resp:  Normal effort.  Abd:  No distention. Other:  Calm and cooperative.   ED Results / Procedures / Treatments   Labs (all labs ordered are listed, but only abnormal results  are displayed) Labs Reviewed  COMPREHENSIVE METABOLIC PANEL - Abnormal; Notable for the following components:      Result Value   Sodium 134 (*)    Glucose, Bld 334 (*)    BUN 23 (*)    All other components within normal limits  SALICYLATE LEVEL - Abnormal; Notable for the following components:   Salicylate Lvl <7.0 (*)    All other components within normal limits  ACETAMINOPHEN LEVEL - Abnormal; Notable for the following components:   Acetaminophen (Tylenol), Serum <10 (*)    All other components within normal limits  ETHANOL  CBC  URINE DRUG SCREEN, QUALITATIVE ([facility] ONLY)     EKG    RADIOLOGY    PROCEDURES:  Critical Care performed: No  Procedures   MEDICATIONS ORDERED IN ED: Medications  escitalopram (LEXAPRO) tablet 10 mg (has no administration in time range)  gabapentin (NEURONTIN) capsule 600 mg (has no administration in time range)    Followed by  gabapentin (NEURONTIN) capsule 600 mg (has no administration in time range)  levETIRAcetam (KEPPRA) tablet 500 mg (has no administration in time range)  multivitamin with minerals tablet 1  tablet (has no administration in time range)  nicotine (NICODERM CQ - dosed in mg/24 hr) patch 7 mg (has no administration in time range)  insulin aspart (novoLOG) injection 0-9 Units (has no administration in time range)  insulin aspart (novoLOG) injection 0-5 Units (has no administration in time range)  insulin aspart (novoLOG) injection 6 Units (has no administration in time range)     IMPRESSION / MDM / ASSESSMENT AND PLAN / ED COURSE  I reviewed the triage vital signs and the nursing notes.  22 year old male with PMH as noted above presents back to the ED after he was recommended for involuntary commitment by psychiatry.  I consulted and discussed the case with NP Shaune Pollack who has evaluated the patient and recommends further psychiatric treatment.  Although the patient is currently denying any symptoms, based on information  from collaterals, she has placed the patient under involuntary commitment.  Differential diagnosis includes, but is not limited to, major depressive disorder, adjustment disorder, substance induced mood disorder.  Patient's presentation is most consistent with acute presentation with potential threat to life or bodily function.  Lab workup is unremarkable.  Glucose is 334.  There is no anion gap.  CBC shows no leukocytosis.  Ethanol level is negative.  I have ordered the patient's home medications and a diabetes regimen per recommendation of the diabetes team.  Disposition will be based on psychiatry team recommendations.  The patient has been placed in psychiatric observation due to the need to provide a safe environment for the patient while obtaining psychiatric consultation and evaluation, as well as ongoing medical and medication management to treat the patient's condition.  The patient has been placed under full IVC at this time.    FINAL CLINICAL IMPRESSION(S) / ED DIAGNOSES   Final diagnoses:  Suicidal ideation     Rx / DC Orders   ED Discharge Orders     None        Note:  This document was prepared using Dragon voice recognition software and may include unintentional dictation errors.    Dionne Bucy, MD 04/06/23 [phone]

## 2023-04-06 NOTE — ED Notes (Addendum)
When this RN went to dress pt in hospital provided safe attire and move pt to behavioral areas so psychiatry could evaluate pt, pt refused and insisted he was not SI or had any thoughts of hurting himself and "there is now law saying I need to stay here". Pt consistently denied ever saying he stated anything regarding suicide.   This charge RN reached out to admitting MD, "I briefly spoke to you earlier about this pt needing a psych consult, when we went to put him in our psych attire he states he "never told anyone I was suicidal or depressed and I want to leave and under what law can you hold me". He is refusing the psych consult, can you please come talk to him and figure out if this needs to be done. He is voluntary so please advise further or D/C this pt. Please and thank you". MD states "D/C orders are in".   Pt was D/C'ed per admitting MD D/C orders because there was no staff notified of psychiatry seeing pt and placing a IVC order including primary RN or this charge RN.

## 2023-04-06 NOTE — ED Triage Notes (Signed)
Pt comes with IVC. Pt was seen here in ED earlier for DKA. Pt apparently told psych that he wanted to kill himself and had a gun. Pt denies all of this. Pt is calm and cooperative in triage.

## 2023-04-06 NOTE — ED Notes (Signed)
No phone for at least today per Molli Knock, NP

## 2023-04-06 NOTE — ED Notes (Signed)
IVC

## 2023-04-06 NOTE — BH Assessment (Signed)
Comprehensive Clinical Assessment (CCA) Note  04/06/2023 Jesus Ewing [id number]  Chief Complaint:  Chief Complaint  Patient presents with   Suicidal   Visit Diagnosis:   F33.1 Major depressive disorder, Recurrent episode, Moderate F41.1 Generalized anxiety disorder  Flowsheet Row ED from 04/06/2023 in [facility] Emergency Department at [facility] ED from 04/05/2023 in [facility] Emergency Department at [facility] ED to Hosp-Admission (Discharged) from 06/03/2022 in [facility] Emergency Department at [facility]  C-SSRS RISK CATEGORY No Risk No Risk No Risk      The patient demonstrates the following risk factors for suicide: Chronic risk factors for suicide include: psychiatric disorder of major depression disorder, anxiety disorder and medical illness diabetes . Acute risk factors for suicide include: social withdrawal/isolation. Protective factors for this patient include: positive social support, positive therapeutic relationship, coping skills, and life satisfaction. Considering these factors, the overall suicide risk at this point appears to be no risk. Patient is not appropriate for outpatient follow up.  Disposition:  Jesus Knock NP, patient meets inpatient criteria.  [name] contacted and bed availability under review.  Disposition discussed with Jesus Ewing.  RN to discuss with EDP.  Jesus Ewing. Jesus Ewing is a 22 year old  who presents involuntarily to [facility] ED via law enforcement and unaccompanied, due to reporting SI during a prior visit in the ED on 04/05/23.  Pt IVC reads " The client has made threats to end his life and not taking his insulin or eating, diabetic".   Pt reports he has a history of major depression disorder.  Pt denies SI, HI, AVH ort Paranoid.  Pt denies any history of intentional self-injurious behaviors.  When clinician asked if he wants to hurt himself at this time, patient states, no.  Pt presents with the following symptoms: loss of  interest, sadness, anxious, worrying, fatigue, frustrated, and difficulty concentrating.  Pt reports he is sleeping six hours during the night; also reports he is eating two meals daily. Pt say he have not drank alcohol in two years; also, reports not using any other substance used.  Pt reports using vape cartridge daily.  Pt unable to identified his primary stressor; however, he reports he is anxious about starting a new job at [employer] on Tuesday, Sept 24, 2024.  Pt reports he lives along with his dog; also, reports his family provides support for him.  Pt reports a family history of mental illness; also, denies family substance used history.  Pt denies any history of abuse or trauma.  Pt denies any current legal problems. Pt denies guns or weapons in the home.  Pt says he is not currently receiving weekly outpatient therapy; also, reports have not taking outpatient medication management since 2020. Pt reports, "I came in here originally for my DKA".  Pt denies previous inpatient psychiatric hospitalization.  Pt is dressed in scrubs, alert, oriented x 4 with clear speech and restless motor behavior.  Eye contact was avoided.  Pt's mood is depressed and affect is anxious.  Thought process is relevant.  Pt's insight is fair and lacking.  Pt judgment is poor.  There is no indication Pt is currently responding to internal stimuli or experiencing delusional thought content.  Pt was cooperative throughout assessment.   CCA Screening, Triage and Referral (STR)  Patient Reported Information How did you hear about [place]? Legal System  What Is the Reason for Your Visit/Call Today? Pt IVC reads "the client has made threats to end his ife and  not taking his insolin or or eating, diabetic.  How Long Has This Been Causing You Problems? 1 wk - 1 month  What Do You Feel Would Help You the Most Today? Treatment for Depression or other mood problem   Have You Recently Had Any Thoughts About Hurting Yourself?  No  Are You Planning to Commit Suicide/Harm Yourself At This time? No   Flowsheet Row ED from 04/06/2023 in [facility] Emergency Department at [facility] ED from 04/05/2023 in [facility] Emergency Department at [facility] ED to Hosp-Admission (Discharged) from 06/03/2022 in [facility] Emergency Department at [facility]  C-SSRS RISK CATEGORY No Risk No Risk No Risk       Have you Recently Had Thoughts About Hurting Someone Jesus Ewing? No  Are You Planning to Harm Someone at This Time? No  Explanation: n/a   Have You Used Any Alcohol or Drugs in the Past 24 Hours? No  What Did You Use and How Much? n/a   Do You Currently Have a Therapist/Psychiatrist? No  Name of Therapist/Psychiatrist: Name of Therapist/Psychiatrist: n/a   Have You Been Recently Discharged From Any Office Practice or Programs? No  Explanation of Discharge From Practice/Program: n/a     CCA Screening Triage Referral Assessment Type of Contact: Face-to-Face  Telemedicine Service Delivery:   Is this Initial or Reassessment?   Date Telepsych consult ordered in [facility]:    Time Telepsych consult ordered in [facility]:    Location of Assessment: [facility] ED  Provider Location: [facility] ED   Collateral Involvement: No collateral involved.   Does Patient Have a Automotive engineer Guardian? No Other: (Unknown)  Legal Guardian Contact Information: n/a  Copy of Legal Guardianship Form: -- (n/a)  Legal Guardian Notified of Arrival: -- (n/a)  Legal Guardian Notified of Pending Discharge: -- (n/a)  If Minor and Not Living with Parent(s), Who has Custody? n/a  Is CPS involved or ever been involved? Never  Is APS involved or ever been involved? Never   Patient Determined To Be At Risk for Harm To Self or Others Based on Review of Patient Reported Information or Presenting Complaint? Yes, for Self-Harm  Method: No Plan  Availability of Means: No access or NA  Intent: Vague intent or  NA  Notification Required: No need or identified person  Additional Information for Danger to Others Potential: -- (n/a)  Additional Comments for Danger to Others Potential: n/a  Are There Guns or Other Weapons in Your Home? No  Types of Guns/Weapons: Pt reports no guns or weapons in his home  Are These Weapons Safely Secured?                            -- (n/a)  Who Could Verify You Are Able To Have These Secured: N/A  Do You Have any Outstanding Charges, Pending Court Dates, Parole/Probation? No  Contacted To Inform of Risk of Harm To Self or Others: Law Enforcement    Does Patient Present under Involuntary Commitment? Yes    [place] of Residence: [place]   Patient Currently Receiving the Following Services: Medication Management   Determination of Need: Urgent (48 hours)   Options For Referral: Inpatient Hospitalization     CCA Biopsychosocial Patient Reported Schizophrenia/Schizoaffective Diagnosis in Past: No   Strengths: Pt reports he starts a new job on Tuesday.   Mental Health Symptoms Depression:   Difficulty Concentrating; Change in energy/activity; Fatigue   Duration of Depressive symptoms:  Duration of Depressive Symptoms: Less than two weeks   Mania:   None   Anxiety:    Irritability; Restlessness; Tension; Worrying; Difficulty concentrating   Psychosis:   None   Duration of Psychotic symptoms:    Trauma:   None   Obsessions:   None   Compulsions:   None   Inattention:   None   Hyperactivity/Impulsivity:   None   Oppositional/Defiant Behaviors:   None   Emotional Irregularity:   Mood lability; Potentially harmful impulsivity   Other Mood/Personality Symptoms:   Depressed/Anxious    Mental Status Exam Appearance and self-care  Stature:   Small   Weight:   Average weight   Clothing:   -- (Pt dressed in scrubs.)   Grooming:   Neglected   Cosmetic use:   Age appropriate   Posture/gait:   Tense    Motor activity:   Restless   Sensorium  Attention:   Confused; Unaware   Concentration:   Anxiety interferes   Orientation:   Object; Person; Place; Situation   Recall/memory:   Normal   Affect and Mood  Affect:   Anxious   Mood:   Depressed   Relating  Eye contact:   Avoided   Facial expression:   Sad; Responsive; Anxious   Attitude toward examiner:   Cooperative   Thought and Language  Speech flow:  Clear and Coherent   Thought content:   Appropriate to Mood and Circumstances   Preoccupation:   None   Hallucinations:   None   Organization:   Coherent   [employer] of Knowledge:   Fair   Intelligence:  No data recorded  Abstraction:   Functional   Judgement:   Poor   Reality Testing:   Realistic   Insight:   Fair; Lacking   Decision Making:   Impulsive   Social Functioning  Social Maturity:   Isolates   Social Judgement:   Normal   Stress  Stressors:   Transitions (Pt reports anxious about new job, scheduled for 04/10/23)   Coping Ability:   Overwhelmed   Skill Deficits:   Decision making; Self-care; Self-control   Supports:   Support needed     Religion: Religion/Spirituality Are You A Religious Person?: Yes What is Your Religious Affiliation?: Christian How Might This Affect Treatment?: not assessed  Leisure/Recreation: Leisure / Recreation Do You Have Hobbies?: Yes Leisure and Hobbies: I enjoy spending time with my dog  Exercise/Diet: Exercise/Diet Do You Exercise?: Yes What Type of Exercise Do You Do?: Run/Walk, Other (Comment) (I run and walk my dog) How Many Times a Week Do You Exercise?: 4-5 times a week Have You Gained or Lost A Significant Amount of Weight in the Past Six Months?: No Do You Follow a Special Diet?: No Do You Have Any Trouble Sleeping?: No   CCA Employment/Education Employment/Work Situation: Employment / Work Situation Employment Situation: Unemployed Patient's  Job has Been Impacted by Current Illness: No Has Patient ever Been in Equities trader?: No  Education: Education Is Patient Currently Attending School?: No Last Grade Completed: 12 Did You Product manager?: No Did You Have An Individualized Education Program (IIEP): No Did You Have Any Difficulty At [employer]?: No Patient's Education Has Been Impacted by Current Illness: No   CCA Family/Childhood History Family and Relationship History: Family history Marital status: Single Does patient have children?: No  Childhood History:  Childhood History By whom was/is the patient raised?: Both parents Did patient suffer any verbal/emotional/physical/sexual abuse  as a child?: No Did patient suffer from severe childhood neglect?: No Has patient ever been sexually abused/assaulted/raped as an adolescent or adult?: No Was the patient ever a victim of a crime or a disaster?: No Witnessed domestic violence?: No Has patient been affected by domestic violence as an adult?: No       CCA Substance Use Alcohol/Drug Use: Alcohol / Drug Use Pain Medications: See MRA Prescriptions: See MRA Over the Counter: See MRA History of alcohol / drug use?: Yes Longest period of sobriety (when/how long): Pt reports two years of sobreity from alcohol Negative Consequences of Use:  (n/a) Withdrawal Symptoms:  (n/a) Substance #1 Name of Substance 1: alchol 1 - Age of First Use: n/a 1 - Amount (size/oz): n/a 1 - Frequency: n/a 1 - Duration: n/a 1 - Last Use / Amount: 2022 1 - Method of Aquiring: UTA 1- Route of Use: drinking                       ASAM's:  Six Dimensions of Multidimensional Assessment  Dimension 1:  Acute Intoxication and/or Withdrawal Potential:   Dimension 1:  Description of individual's past and current experiences of substance use and withdrawal: Pt reports he use to drink alcohol, "I don't drink anymore, 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"  Dimension 2:  Biomedical  Conditions and Complications:   Dimension 2:  Description of patient's biomedical conditions and  complications: Debetics  Dimension 3:  Emotional, Behavioral, or Cognitive Conditions and Complications:  Dimension 3:  Description of emotional, behavioral, or cognitive conditions and complications: Major Depression, Anxiety Disorder  Dimension 4:  Readiness to Change:  Dimension 4:  Description of Readiness to Change criteria: maintence  Dimension 5:  Relapse, Continued use, or Continued Problem Potential:  Dimension 5:  Relapse, continued use, or continued problem potential critiera description: maintance  Dimension 6:  Recovery/Living Environment:  Dimension 6:  Recovery/Iiving environment criteria description: Pt reports he live along with his dog in a safe place  ASAM Severity Score: ASAM's Severity Rating Score: 7  ASAM Recommended Level of Treatment:     Substance use Disorder (SUD) Substance Use Disorder (SUD)  Checklist Symptoms of Substance Use:  (n/a)  Recommendations for Services/Supports/Treatments: Recommendations for Services/Supports/Treatments Recommendations For Services/Supports/Treatments: Medication Management  Discharge Disposition:    DSM5 Diagnoses: Patient Active Problem List   Diagnosis Date Noted   Major depressive disorder, recurrent severe without psychotic features (HCC) 04/06/2023   Hyperglycemia 04/05/2023   Altered mental status 06/03/2022   DKA (diabetic ketoacidosis) (HCC) 04/21/2022   Diabetic ketoacidosis without coma associated with type 1 diabetes mellitus (HCC) 04/20/2022   History of substance abuse (HCC) 04/20/2022   Abnormal MRI scan, bone 02/21/2022   Uncontrolled type 1 diabetes mellitus with hyperglycemia, with long-term current use of insulin (HCC) 02/21/2022   Charcot foot due to diabetes mellitus (HCC)-right foot 02/21/2022   Substance abuse (HCC) 12/12/2021   Severe hyperglycemia due to diabetes mellitus (HCC) 07/03/2021   Seizure  disorder (HCC) 02/14/2021   Anxiety and depression 02/14/2021   Celiac disease 06/19/2016   Poor compliance with medication 07/08/2014   Type 1 diabetes mellitus without complication (HCC) 06/17/2013     Referrals to Alternative Service(s): Referred to Alternative Service(s):   Place:   Date:   Time:    Referred to Alternative Service(s):   Place:   Date:   Time:    Referred to Alternative Service(s):   Place:   Date:  Time:    Referred to Alternative Service(s):   Place:   Date:   Time:     Meryle Ready, Counselor

## 2023-04-07 ENCOUNTER — Other Ambulatory Visit: Payer: Self-pay

## 2023-04-07 ENCOUNTER — Encounter: Payer: Self-pay | Admitting: Psychiatric/Mental Health

## 2023-04-07 ENCOUNTER — Inpatient Hospital Stay (HOSPITAL_COMMUNITY)
Admission: AD | Admit: 2023-04-07 | Discharge: 2023-04-13 | DRG: 885 | Disposition: A | Discharge status: Home / Self Care | Payer: 59 | Source: Intra-hospital | Attending: Psychiatry | Admitting: Psychiatry

## 2023-04-07 DIAGNOSIS — Z794 Long term (current) use of insulin: Secondary | ICD-10-CM | POA: Diagnosis not present

## 2023-04-07 DIAGNOSIS — Z79899 Other long term (current) drug therapy: Secondary | ICD-10-CM

## 2023-04-07 DIAGNOSIS — F332 Major depressive disorder, recurrent severe without psychotic features: Principal | ICD-10-CM | POA: Diagnosis present

## 2023-04-07 DIAGNOSIS — Z89421 Acquired absence of other right toe(s): Secondary | ICD-10-CM

## 2023-04-07 DIAGNOSIS — Z5982 Transportation insecurity: Secondary | ICD-10-CM | POA: Diagnosis not present

## 2023-04-07 DIAGNOSIS — F17210 Nicotine dependence, cigarettes, uncomplicated: Secondary | ICD-10-CM | POA: Diagnosis present

## 2023-04-07 DIAGNOSIS — T383X6A Underdosing of insulin and oral hypoglycemic [antidiabetic] drugs, initial encounter: Secondary | ICD-10-CM | POA: Diagnosis present

## 2023-04-07 DIAGNOSIS — F17290 Nicotine dependence, other tobacco product, uncomplicated: Secondary | ICD-10-CM | POA: Diagnosis present

## 2023-04-07 DIAGNOSIS — R45851 Suicidal ideations: Secondary | ICD-10-CM | POA: Diagnosis present

## 2023-04-07 DIAGNOSIS — Z91128 Patient's intentional underdosing of medication regimen for other reason: Secondary | ICD-10-CM | POA: Diagnosis not present

## 2023-04-07 DIAGNOSIS — Z91148 Patient's other noncompliance with medication regimen for other reason: Secondary | ICD-10-CM

## 2023-04-07 DIAGNOSIS — K900 Celiac disease: Secondary | ICD-10-CM | POA: Diagnosis present

## 2023-04-07 DIAGNOSIS — G4700 Insomnia, unspecified: Secondary | ICD-10-CM | POA: Diagnosis present

## 2023-04-07 DIAGNOSIS — E1010 Type 1 diabetes mellitus with ketoacidosis without coma: Secondary | ICD-10-CM | POA: Diagnosis not present

## 2023-04-07 DIAGNOSIS — E119 Type 2 diabetes mellitus without complications: Secondary | ICD-10-CM | POA: Diagnosis present

## 2023-04-07 LAB — URINE DRUG SCREEN, QUALITATIVE (ARMC ONLY)
Amphetamines, Ur Screen: POSITIVE — AB
Barbiturates, Ur Screen: NOT DETECTED
Benzodiazepine, Ur Scrn: NOT DETECTED
Cannabinoid 50 Ng, Ur ~~LOC~~: NOT DETECTED
Cocaine Metabolite,Ur ~~LOC~~: NOT DETECTED
MDMA (Ecstasy)Ur Screen: NOT DETECTED
Methadone Scn, Ur: NOT DETECTED
Opiate, Ur Screen: NOT DETECTED
Phencyclidine (PCP) Ur S: NOT DETECTED
Tricyclic, Ur Screen: NOT DETECTED

## 2023-04-07 LAB — CBG MONITORING, ED
Glucose-Capillary: 158 mg/dL — ABNORMAL HIGH (ref 70–99)
Glucose-Capillary: 213 mg/dL — ABNORMAL HIGH (ref 70–99)
Glucose-Capillary: 125 mg/dL — ABNORMAL HIGH (ref 70–99)
Glucose-Capillary: 286 mg/dL — ABNORMAL HIGH (ref 70–99)

## 2023-04-07 LAB — GLUCOSE, CAPILLARY: Glucose-Capillary: 246 mg/dL — ABNORMAL HIGH (ref 70–99)

## 2023-04-07 MED ORDER — HALOPERIDOL LACTATE 5 MG/ML IJ SOLN: 5.0000 mg | Freq: Three times a day (TID) | INTRAMUSCULAR | Status: DC | PRN

## 2023-04-07 MED ORDER — HALOPERIDOL 5 MG PO TABS: 5.0000 mg | ORAL_TABLET | Freq: Three times a day (TID) | ORAL | Status: DC | PRN

## 2023-04-07 MED ORDER — DIPHENHYDRAMINE HCL 25 MG PO CAPS: 50.0000 mg | ORAL_CAPSULE | Freq: Three times a day (TID) | ORAL | Status: DC | PRN

## 2023-04-07 MED ORDER — MAGNESIUM HYDROXIDE 400 MG/5ML PO SUSP: 30.0000 mL | Freq: Every day | ORAL | Status: DC | PRN

## 2023-04-07 MED ORDER — ACETAMINOPHEN 325 MG PO TABS: 650.0000 mg | ORAL_TABLET | Freq: Four times a day (QID) | ORAL | Status: DC | PRN

## 2023-04-07 MED ORDER — HYDROXYZINE HCL 25 MG PO TABS
25.0000 mg | ORAL_TABLET | Freq: Three times a day (TID) | ORAL | Status: DC | PRN
  Administered 2023-04-11: 25 mg via ORAL
  Filled 2023-04-07: qty 1

## 2023-04-07 MED ORDER — ALUM & MAG HYDROXIDE-SIMETH 200-200-20 MG/5ML PO SUSP: 30.0000 mL | ORAL | Status: DC | PRN

## 2023-04-07 MED ORDER — DIPHENHYDRAMINE HCL 50 MG/ML IJ SOLN: 50.0000 mg | Freq: Three times a day (TID) | INTRAMUSCULAR | Status: DC | PRN

## 2023-04-07 MED ORDER — GABAPENTIN 300 MG PO CAPS
600.0000 mg | ORAL_CAPSULE | Freq: Two times a day (BID) | ORAL | Status: DC
  Administered 2023-04-07 – 2023-04-13 (×12): 600 mg via ORAL
  Filled 2023-04-07 (×12): qty 2

## 2023-04-07 MED ORDER — NICOTINE 7 MG/24HR TD PT24
7.0000 mg | MEDICATED_PATCH | Freq: Every day | TRANSDERMAL | Status: DC
  Filled 2023-04-07 (×7): qty 1

## 2023-04-07 MED ORDER — ADULT MULTIVITAMIN W/MINERALS CH
1.0000 | ORAL_TABLET | Freq: Every day | ORAL | Status: DC
  Administered 2023-04-08 – 2023-04-13 (×6): 1 via ORAL
  Filled 2023-04-07 (×6): qty 1

## 2023-04-07 MED ORDER — LEVETIRACETAM 500 MG PO TABS
500.0000 mg | ORAL_TABLET | Freq: Two times a day (BID) | ORAL | Status: DC
  Administered 2023-04-07 – 2023-04-13 (×12): 500 mg via ORAL
  Filled 2023-04-07 (×13): qty 1

## 2023-04-07 MED ORDER — INSULIN ASPART 100 UNIT/ML IJ SOLN
0.0000 [IU] | Freq: Every day | INTRAMUSCULAR | Status: DC
  Administered 2023-04-07: 2 [IU] via SUBCUTANEOUS
  Administered 2023-04-10: 5 [IU] via SUBCUTANEOUS
  Filled 2023-04-07 (×2): qty 1

## 2023-04-07 MED ORDER — LORAZEPAM 2 MG PO TABS: 2.0000 mg | ORAL_TABLET | Freq: Three times a day (TID) | ORAL | Status: DC | PRN

## 2023-04-07 MED ORDER — INSULIN ASPART 100 UNIT/ML IJ SOLN
6.0000 [IU] | Freq: Three times a day (TID) | INTRAMUSCULAR | Status: DC
  Administered 2023-04-07 – 2023-04-11 (×13): 6 [IU] via SUBCUTANEOUS
  Filled 2023-04-07 (×9): qty 1

## 2023-04-07 MED ORDER — ESCITALOPRAM OXALATE 10 MG PO TABS
10.0000 mg | ORAL_TABLET | Freq: Every day | ORAL | Status: DC
  Administered 2023-04-08 – 2023-04-13 (×6): 10 mg via ORAL
  Filled 2023-04-07 (×6): qty 1

## 2023-04-07 MED ORDER — LORAZEPAM 2 MG/ML IJ SOLN: 2.0000 mg | Freq: Three times a day (TID) | INTRAMUSCULAR | Status: DC | PRN

## 2023-04-07 MED ORDER — INSULIN ASPART 100 UNIT/ML IJ SOLN
0.0000 [IU] | Freq: Three times a day (TID) | INTRAMUSCULAR | Status: DC
  Administered 2023-04-07: 9 [IU] via SUBCUTANEOUS
  Administered 2023-04-08: 0 [IU] via SUBCUTANEOUS
  Administered 2023-04-08: 1 [IU] via SUBCUTANEOUS
  Administered 2023-04-08: 5 [IU] via SUBCUTANEOUS
  Administered 2023-04-09 (×2): 2 [IU] via SUBCUTANEOUS
  Administered 2023-04-09: 5 [IU] via SUBCUTANEOUS
  Administered 2023-04-10: 6 [IU] via SUBCUTANEOUS
  Administered 2023-04-10: 7 [IU] via SUBCUTANEOUS
  Administered 2023-04-10: 9 [IU] via SUBCUTANEOUS
  Administered 2023-04-11: 7 [IU] via SUBCUTANEOUS
  Administered 2023-04-11: 3 [IU] via SUBCUTANEOUS
  Administered 2023-04-11: 9 [IU] via SUBCUTANEOUS
  Filled 2023-04-07 (×7): qty 1

## 2023-04-07 MED ORDER — INSULIN GLARGINE-YFGN 100 UNIT/ML ~~LOC~~ SOLN
36.0000 [IU] | Freq: Every day | SUBCUTANEOUS | Status: DC
  Administered 2023-04-07 – 2023-04-11 (×5): 36 [IU] via SUBCUTANEOUS
  Filled 2023-04-07 (×5): qty 0.36

## 2023-04-07 MED ORDER — GABAPENTIN 300 MG PO CAPS: 600.0000 mg | ORAL_CAPSULE | Freq: Three times a day (TID) | ORAL | Status: DC

## 2023-04-07 MED ORDER — TRAZODONE HCL 50 MG PO TABS: 50.0000 mg | ORAL_TABLET | Freq: Every evening | ORAL | Status: DC | PRN

## 2023-04-07 NOTE — Plan of Care (Signed)
Problem: Education: Goal: Knowledge of [facility] Education information/materials will improve 04/07/2023 2352 by Loreen Freud, RN Outcome: Progressing 04/07/2023 2303 by Loreen Freud, RN Outcome: Progressing Goal: Emotional status will improve 04/07/2023 2352 by Loreen Freud, RN Outcome: Progressing 04/07/2023 2303 by Loreen Freud, RN Outcome: Progressing Goal: Mental status will improve 04/07/2023 2352 by Loreen Freud, RN Outcome: Progressing 04/07/2023 2303 by Loreen Freud, RN Outcome: Progressing Goal: Verbalization of understanding the information provided will improve 04/07/2023 2352 by Loreen Freud, RN Outcome: Progressing 04/07/2023 2303 by Loreen Freud, RN Outcome: Progressing   Problem: Activity: Goal: Interest or engagement in activities will improve 04/07/2023 2352 by Loreen Freud, RN Outcome: Progressing 04/07/2023 2303 by Loreen Freud, RN Outcome: Progressing Goal: Sleeping patterns will improve 04/07/2023 2352 by Loreen Freud, RN Outcome: Progressing 04/07/2023 2303 by Loreen Freud, RN Outcome: Progressing   Problem: Coping: Goal: Ability to verbalize frustrations and anger appropriately will improve 04/07/2023 2352 by Loreen Freud, RN Outcome: Progressing 04/07/2023 2303 by Loreen Freud, RN Outcome: Progressing Goal: Ability to demonstrate self-control will improve 04/07/2023 2352 by Loreen Freud, RN Outcome: Progressing 04/07/2023 2303 by Loreen Freud, RN Outcome: Progressing   Problem: Health Behavior/Discharge Planning: Goal: Identification of resources available to assist in meeting health care needs will improve 04/07/2023 2352 by Loreen Freud, RN Outcome: Progressing 04/07/2023 2303 by Loreen Freud, RN Outcome: Progressing Goal: Compliance with treatment plan for underlying cause of condition will improve 04/07/2023 2352 by Loreen Freud, RN Outcome: Progressing 04/07/2023 2303 by Loreen Freud, RN Outcome:  Progressing   Problem: Physical Regulation: Goal: Ability to maintain clinical measurements within normal limits will improve 04/07/2023 2352 by Loreen Freud, RN Outcome: Progressing 04/07/2023 2303 by Loreen Freud, RN Outcome: Progressing   Problem: Safety: Goal: Periods of time without injury will increase 04/07/2023 2352 by Loreen Freud, RN Outcome: Progressing 04/07/2023 2303 by Loreen Freud, RN Outcome: Progressing   Problem: Education: Goal: Knowledge of General Education information will improve Description: Including pain rating scale, medication(s)/side effects and non-pharmacologic comfort measures 04/07/2023 2352 by Loreen Freud, RN Outcome: Progressing 04/07/2023 2303 by Loreen Freud, RN Outcome: Progressing   Problem: Health Behavior/Discharge Planning: Goal: Ability to manage health-related needs will improve 04/07/2023 2352 by Loreen Freud, RN Outcome: Progressing 04/07/2023 2303 by Loreen Freud, RN Outcome: Progressing   Problem: Clinical Measurements: Goal: Ability to maintain clinical measurements within normal limits will improve 04/07/2023 2352 by Loreen Freud, RN Outcome: Progressing 04/07/2023 2303 by Loreen Freud, RN Outcome: Progressing Goal: Will remain free from infection 04/07/2023 2352 by Loreen Freud, RN Outcome: Progressing 04/07/2023 2303 by Loreen Freud, RN Outcome: Progressing Goal: Diagnostic test results will improve 04/07/2023 2352 by Loreen Freud, RN Outcome: Progressing 04/07/2023 2303 by Loreen Freud, RN Outcome: Progressing Goal: Respiratory complications will improve 04/07/2023 2352 by Loreen Freud, RN Outcome: Progressing 04/07/2023 2303 by Loreen Freud, RN Outcome: Progressing Goal: Cardiovascular complication will be avoided 04/07/2023 2352 by Loreen Freud, RN Outcome: Progressing 04/07/2023 2303 by Loreen Freud, RN Outcome: Progressing   Problem: Activity: Goal: Risk for activity intolerance will  decrease 04/07/2023 2352 by Loreen Freud, RN Outcome: Progressing 04/07/2023 2303 by Loreen Freud, RN Outcome: Progressing   Problem: Nutrition: Goal: Adequate nutrition will be maintained 04/07/2023 2352 by Loreen Freud, RN Outcome: Progressing 04/07/2023 2303 by Loreen Freud, RN Outcome: Progressing   Problem: Coping: Goal: Level of anxiety will decrease 04/07/2023 2352 by Loreen Freud, RN Outcome: Progressing 04/07/2023 2303 by Loreen Freud, RN Outcome: Progressing   Problem: Elimination: Goal: Will not experience complications related  to bowel motility 04/07/2023 2352 by Loreen Freud, RN Outcome: Progressing 04/07/2023 2303 by Loreen Freud, RN Outcome: Progressing Goal: Will not experience complications related to urinary retention 04/07/2023 2352 by Loreen Freud, RN Outcome: Progressing 04/07/2023 2303 by Loreen Freud, RN Outcome: Progressing   Problem: Pain Managment: Goal: General experience of comfort will improve 04/07/2023 2352 by Loreen Freud, RN Outcome: Progressing 04/07/2023 2303 by Loreen Freud, RN Outcome: Progressing   Problem: Safety: Goal: Ability to remain free from injury will improve 04/07/2023 2352 by Loreen Freud, RN Outcome: Progressing 04/07/2023 2303 by Loreen Freud, RN Outcome: Progressing   Problem: Skin Integrity: Goal: Risk for impaired skin integrity will decrease 04/07/2023 2352 by Loreen Freud, RN Outcome: Progressing 04/07/2023 2303 by Loreen Freud, RN Outcome: Progressing   Problem: Education: Goal: Ability to describe self-care measures that may prevent or decrease complications (Diabetes Survival Skills Education) will improve 04/07/2023 2352 by Loreen Freud, RN Outcome: Progressing 04/07/2023 2303 by Loreen Freud, RN Outcome: Progressing Goal: Individualized Educational Video(s) 04/07/2023 2352 by Loreen Freud, RN Outcome: Progressing 04/07/2023 2303 by Loreen Freud, RN Outcome: Progressing    Problem: Cardiac: Goal: Ability to maintain an adequate cardiac output will improve 04/07/2023 2352 by Loreen Freud, RN Outcome: Progressing 04/07/2023 2303 by Loreen Freud, RN Outcome: Progressing   Problem: Health Behavior/Discharge Planning: Goal: Ability to identify and utilize available resources and services will improve 04/07/2023 2352 by Loreen Freud, RN Outcome: Progressing 04/07/2023 2303 by Loreen Freud, RN Outcome: Progressing Goal: Ability to manage health-related needs will improve 04/07/2023 2352 by Loreen Freud, RN Outcome: Progressing 04/07/2023 2303 by Loreen Freud, RN Outcome: Progressing   Problem: Fluid Volume: Goal: Ability to achieve a balanced intake and output will improve 04/07/2023 2352 by Loreen Freud, RN Outcome: Progressing 04/07/2023 2303 by Loreen Freud, RN Outcome: Progressing   Problem: Metabolic: Goal: Ability to maintain appropriate glucose levels will improve 04/07/2023 2352 by Loreen Freud, RN Outcome: Progressing 04/07/2023 2303 by Loreen Freud, RN Outcome: Progressing   Problem: Nutritional: Goal: Maintenance of adequate nutrition will improve 04/07/2023 2352 by Loreen Freud, RN Outcome: Progressing 04/07/2023 2303 by Loreen Freud, RN Outcome: Progressing Goal: Maintenance of adequate weight for body size and type will improve 04/07/2023 2352 by Loreen Freud, RN Outcome: Progressing 04/07/2023 2303 by Loreen Freud, RN Outcome: Progressing   Problem: Respiratory: Goal: Will regain and/or maintain adequate ventilation 04/07/2023 2352 by Loreen Freud, RN Outcome: Progressing 04/07/2023 2303 by Loreen Freud, RN Outcome: Progressing   Problem: Urinary Elimination: Goal: Ability to achieve and maintain adequate renal perfusion and functioning will improve 04/07/2023 2352 by Loreen Freud, RN Outcome: Progressing 04/07/2023 2303 by Loreen Freud, RN Outcome: Progressing   Problem: Education: Goal: Ability  to make informed decisions regarding treatment will improve 04/07/2023 2352 by Loreen Freud, RN Outcome: Progressing 04/07/2023 2303 by Loreen Freud, RN Outcome: Progressing   Problem: Coping: Goal: Coping ability will improve 04/07/2023 2352 by Loreen Freud, RN Outcome: Progressing 04/07/2023 2303 by Loreen Freud, RN Outcome: Progressing   Problem: Health Behavior/Discharge Planning: Goal: Identification of resources available to assist in meeting health care needs will improve 04/07/2023 2352 by Loreen Freud, RN Outcome: Progressing 04/07/2023 2303 by Loreen Freud, RN Outcome: Progressing   Problem: Medication: Goal: Compliance with prescribed medication regimen will improve 04/07/2023 2352 by Loreen Freud, RN Outcome: Progressing 04/07/2023 2303 by Loreen Freud, RN Outcome: Progressing   Problem: Self-Concept: Goal: Ability to disclose and discuss suicidal ideas will improve 04/07/2023 2352 by Loreen Freud, RN Outcome: Progressing 04/07/2023 2303 by Red Christians,  Sanela Evola, RN Outcome: Progressing Goal: Will verbalize positive feelings about self 04/07/2023 2352 by Loreen Freud, RN Outcome: Progressing Note:   04/07/2023 2303 by Loreen Freud, RN Outcome: Progressing Note:

## 2023-04-07 NOTE — ED Notes (Signed)
Patient aware of plan of care for today. No needs at this time.

## 2023-04-07 NOTE — ED Notes (Signed)
Pt laying down in bed in a position of comfort. Respirations unlabored. Pts food trash disposed of properly. Pt denied any further needs at this time.

## 2023-04-07 NOTE — Progress Notes (Signed)
Pt is alert and oriented. . Pt denies experiencing any pain , anxiety and depression  Pt denies experiencing SI/HI or AVH at this time.      Scheduled medications administered to pt, per MD orders . Support and encouragement provided . Frequent verbal contacts  made. Routine safety checks conducted q15.     No adverse drug reactions noted . Pt verbally contracts for safety at this time. Pt is complaint with medications  and treatment plan. Pt interacts well with others on the unit. Pt remains safe with others on the unit. Pt remains safe at this time, plan of care ongoing.

## 2023-04-07 NOTE — Tx Team (Signed)
Initial Treatment Plan 04/07/2023 5:14 PM [name] ZOX:[id number]    PATIENT STRESSORS: Health problems   Loss of toe/amputation     PATIENT STRENGTHS: Ability for insight  Communication skills  Financial means  Supportive family/friends    PATIENT IDENTIFIED PROBLEMS: Physical condition/unstable insulin dependent diabetes  Access to weapons  SI  AKI               DISCHARGE CRITERIA:  Ability to meet basic life and health needs Adequate post-discharge living arrangements Improved stabilization in mood, thinking, and/or behavior Medical problems require only outpatient monitoring Need for constant or close observation no longer present Safe-care adequate arrangements made Verbal commitment to aftercare and medication compliance  PRELIMINARY DISCHARGE PLAN: Outpatient therapy Return to previous living arrangement  PATIENT/FAMILY INVOLVEMENT: This treatment plan has been presented to and reviewed with the patient, Jesus Ewing.  The patient and family have been given the opportunity to ask questions and make suggestions.  Delos Haring, RN 04/07/2023, 5:14 PM

## 2023-04-07 NOTE — ED Provider Notes (Signed)
Emergency Medicine Observation Re-evaluation Note  Jesus Ewing is a 22 y.o. male, seen on rounds today.  Pt initially presented to the ED for complaints of Suicidal Currently, the patient is resting, voices no medical complaints.  Physical Exam  BP 106/70 (BP Location: Right Arm)   Pulse 93   Temp 98 F (36.7 C) (Oral)   Resp 18   SpO2 98%  Physical Exam General: Resting in no acute distress Cardiac: No cyanosis Lungs: Equal rise and fall Psych: Not agitated  ED Course / MDM  EKG:   I have reviewed the labs performed to date as well as medications administered while in observation.  Recent changes in the last 24 hours include no events overnight.  Plan  Current plan is for psychiatric disposition.    Irean Hong, MD 04/07/23 [phone]

## 2023-04-07 NOTE — ED Notes (Signed)
Grandfather called to check on patient and was asking for information. Asked patient for permission to share info, patient stated "not at this moment". Grandfather notified that I could not give any info at this time.

## 2023-04-07 NOTE — Group Note (Signed)
Date:  04/07/2023 Time:  9:40 PM  Group Topic/Focus:  Goals Group:   The focus of this group is to help patients establish daily goals to achieve during treatment and discuss how the patient can incorporate goal setting into their daily lives to aide in recovery. Wrap-Up Group:   The focus of this group is to help patients review their daily goal of treatment and discuss progress on daily workbooks.    Participation Level:  Did Not Attend   Katina Dung 04/07/2023, 9:40 PM

## 2023-04-07 NOTE — Progress Notes (Signed)
Addendum: Pt. Very pleasant but anxious. He denies that he ever said he was going to kill himself. He refuses to sign any admission and/or treatment documents until one of the [facility] doctors come to see him. 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 He states that his foot is painful most of the time when he does not wear his special brace.

## 2023-04-07 NOTE — ED Notes (Addendum)
Patient has been accepted to [facility].  Patient assigned to room 309 Accepting physician is Dr. Marlou Porch.  Call report to [phone].  Representative was Antionette Cillo.   ER Staff is aware of it:  [facility] ER Secretary  Dr. Larinda Buttery, ER MD  Victorino Dike Patient's Nurse

## 2023-04-07 NOTE — ED Notes (Signed)
Patient is aware of plan to admit to [facility] later today, no needs at this time.

## 2023-04-07 NOTE — Consult Note (Signed)
[facility] Face-to-Face Psychiatry Consult   Reason for Consult:  Suicidal Ideation, Substance Use Disorder Referring Physician: Dionne Bucy, MD  Patient Identification: Jesus Ewing MRN:  [id number] Principal Diagnosis: Major depressive disorder, recurrent severe without psychotic features (HCC) Diagnosis:  Principal Problem:   Major depressive disorder, recurrent severe without psychotic features (HCC)   Total Time spent with patient: 30 minutes  Subjective:   Jesus Ewing is a 22 y.o. male patient admitted with for diabetic ketoacidosis (DKA), psych consult placed as Jesus Ewing threatened suicide to the nurse and his mother on admission. Jesus Ewing was also not eating prior to admission or taking his insulin. This morning, Jesus Ewing is feeling well, Jesus Ewing denies symptoms of depression and anxiety. Jesus Ewing reports sleeping fair related to a hallway bed which can be loud. Jesus Ewing is eating/drinking well. His blood sugars are improving with the medication management. Jesus Ewing denies suicidal or homicidal ideation. Jesus Ewing reports having charcot food due to diabetes and requests a brace for walking/ambulation (family might bring from home). Jesus Ewing is curious about the next steps regarding inpatient admission which was explained to him. Today, Jesus Ewing is agreeable to restart his Lexapro.   Past Psychiatric History: None  Risk to Self:  Yes Risk to Others:  No Prior Inpatient Therapy:  No Prior Outpatient Therapy:  No  Past Medical History:  Past Medical History:  Diagnosis Date   Celiac disease    Diabetes mellitus without complication (HCC)     Past Surgical History:  Procedure Laterality Date   AMPUTATION TOE Right 11/10/2021   Procedure: AMPUTATION TOE;  Surgeon: Candelaria Stagers, DPM;  Location: [facility] ORS;  Service: Podiatry;  Laterality: Right;   Family History:  Family History  Problem Relation Age of Onset   Healthy Mother    Diabetes Neg Hx    Family Psychiatric  History: Possible Bipolar (patient  reported; Mother) Social History:  Social History   Substance and Sexual Activity  Alcohol Use Not Currently     Social History   Substance and Sexual Activity  Drug Use Not Currently   Types: Cocaine    Social History   Socioeconomic History   Marital status: Single    Spouse name: Not on file   Number of children: Not on file   Years of education: Not on file   Highest education level: Not on file  Occupational History   Not on file  Tobacco Use   Smoking status: Every Day    Types: Cigarettes, E-cigarettes   Smokeless tobacco: Never   Tobacco comments:    Vape daily, don't smoke anymore.  Vaping Use   Vaping status: Every Day  Substance and Sexual Activity   Alcohol use: Not Currently   Drug use: Not Currently    Types: Cocaine   Sexual activity: Not on file  Other Topics Concern   Not on file  Social History Narrative   Not on file   Social Determinants of Health   Financial Resource Strain: Low Risk  (01/25/2023)   Received from [facility]   Overall Financial Resource Strain (CARDIA)    Difficulty of Paying Living Expenses: Not hard at all  Food Insecurity: No Food Insecurity (01/25/2023)   Received from [facility]   Hunger Vital Sign    Worried About Running Out of Food in the Last Year: Never true    Ran Out of Food in the Last Year: Never true  Transportation Needs: Unmet Transportation Needs (01/25/2023)   Received  from [facility] - Transportation    In the past 12 months, has lack of transportation kept you from medical appointments or from getting medications?: Yes    Lack of Transportation (Non-Medical): Yes  Physical Activity: Inactive (08/12/2020)   Received from [facility], [facility]   Exercise Vital Sign    Days of Exercise per Week: 0 days    Minutes of Exercise per Session: 0 min  Stress: Not on file  Social Connections: Not on file    Additional Social History:lives alone   Allergies:  No Known Allergies  Labs:  Results for orders placed or performed during the hospital encounter of 04/06/23 (from the past 48 hour(s))  Comprehensive metabolic panel     Status: Abnormal   Collection Time: 04/06/23  1:52 PM  Result Value Ref Range   Sodium 134 (L) 135 - 145 mmol/L   Potassium 3.9 3.5 - 5.1 mmol/L   Chloride 102 98 - 111 mmol/L   CO2 22 22 - 32 mmol/L   Glucose, Bld 334 (H) 70 - 99 mg/dL    Comment: Glucose reference range applies only to samples taken after fasting for at least 8 hours.   BUN 23 (H) 6 - 20 mg/dL   Creatinine, Ser 1.61 0.61 - 1.24 mg/dL   Calcium 9.2 8.9 - 09.6 mg/dL   Total Protein 6.6 6.5 - 8.1 g/dL   Albumin 3.5 3.5 - 5.0 g/dL   AST 20 15 - 41 U/L   ALT 32 0 - 44 U/L   Alkaline Phosphatase 80 38 - 126 U/L   Total Bilirubin 0.8 0.3 - 1.2 mg/dL   GFR, Estimated >04 >54 mL/min    Comment: (NOTE) Calculated using the CKD-EPI Creatinine Equation (2021)    Anion gap 10 5 - 15    Comment: Performed at [facility], [address] [place], [place] [address]  Ethanol     Status: None   Collection Time: 04/06/23  1:52 PM  Result Value Ref Range   Alcohol, Ethyl (B) <10 <10 mg/dL    Comment: (NOTE) Lowest detectable limit for serum alcohol is 10 mg/dL.  For medical purposes only. Performed at [facility], [address] [place], [place] [address]   Salicylate level     Status: Abnormal   Collection Time: 04/06/23  1:52 PM  Result Value Ref Range   Salicylate Lvl <7.0 (L) 7.0 - 30.0 mg/dL    Comment: Performed at [facility], [address] [place], [place] [address]  Acetaminophen level     Status: Abnormal   Collection Time: 04/06/23  1:52 PM  Result Value Ref Range   Acetaminophen (Tylenol), Serum <10 (L) 10 - 30 ug/mL    Comment: (NOTE) Therapeutic concentrations vary significantly. A range of 10-30 ug/mL  may be an effective concentration for many  patients. However, some  are best treated at concentrations outside of this range. Acetaminophen concentrations >150 ug/mL at 4 hours after ingestion  and >50 ug/mL at 12 hours after ingestion are often associated with  toxic reactions.  Performed at [facility], [address] [place], [place] [address]   cbc     Status: None   Collection Time: 04/06/23  1:52 PM  Result Value Ref Range   WBC 6.5 4.0 - 10.5 K/uL   RBC 5.32 4.22 - 5.81 MIL/uL   Hemoglobin 16.5 13.0 - 17.0 g/dL   HCT 57.8 46.9 -  52.0 %   MCV 86.1 80.0 - 100.0 fL   MCH 31.0 26.0 - 34.0 pg   MCHC 36.0 30.0 - 36.0 g/dL   RDW 16.1 09.6 - 04.5 %   Platelets 337 150 - 400 K/uL   nRBC 0.0 0.0 - 0.2 %    Comment: Performed at [facility], [address], [place], [place] [address]  Urine Drug Screen, Qualitative     Status: Abnormal   Collection Time: 04/06/23  3:20 PM  Result Value Ref Range   Tricyclic, Ur Screen NONE DETECTED NONE DETECTED   Amphetamines, Ur Screen POSITIVE (A) NONE DETECTED   MDMA (Ecstasy)Ur Screen NONE DETECTED NONE DETECTED   Cocaine Metabolite,Ur [place] NONE DETECTED NONE DETECTED   Opiate, Ur Screen NONE DETECTED NONE DETECTED   Phencyclidine (PCP) Ur S NONE DETECTED NONE DETECTED   Cannabinoid 50 Ng, Ur [place] NONE DETECTED NONE DETECTED   Barbiturates, Ur Screen NONE DETECTED NONE DETECTED   Benzodiazepine, Ur Scrn NONE DETECTED NONE DETECTED   Methadone Scn, Ur NONE DETECTED NONE DETECTED    Comment: (NOTE) Tricyclics + metabolites, urine    Cutoff 1000 ng/mL Amphetamines + metabolites, urine  Cutoff 1000 ng/mL MDMA (Ecstasy), urine              Cutoff 500 ng/mL Cocaine Metabolite, urine          Cutoff 300 ng/mL Opiate + metabolites, urine        Cutoff 300 ng/mL Phencyclidine (PCP), urine         Cutoff 25 ng/mL Cannabinoid, urine                 Cutoff 50 ng/mL Barbiturates + metabolites, urine  Cutoff 200 ng/mL Benzodiazepine, urine              Cutoff 200  ng/mL Methadone, urine                   Cutoff 300 ng/mL  The urine drug screen provides only a preliminary, unconfirmed analytical test result and should not be used for non-medical purposes. Clinical consideration and professional judgment should be applied to any positive drug screen result due to possible interfering substances. A more specific alternate chemical method must be used in order to obtain a confirmed analytical result. Gas chromatography / mass spectrometry (GC/MS) is the preferred confirm atory method. Performed at [facility], [address] [place], [place] [address]   CBG monitoring, ED     Status: Abnormal   Collection Time: 04/06/23  4:52 PM  Result Value Ref Range   Glucose-Capillary 374 (H) 70 - 99 mg/dL    Comment: Glucose reference range applies only to samples taken after fasting for at least 8 hours.  CBG monitoring, ED     Status: Abnormal   Collection Time: 04/06/23  9:21 PM  Result Value Ref Range   Glucose-Capillary 121 (H) 70 - 99 mg/dL    Comment: Glucose reference range applies only to samples taken after fasting for at least 8 hours.  CBG monitoring, ED     Status: Abnormal   Collection Time: 04/07/23  8:21 AM  Result Value Ref Range   Glucose-Capillary 158 (H) 70 - 99 mg/dL    Comment: Glucose reference range applies only to samples taken after fasting for at least 8 hours.    Current Facility-Administered Medications  Medication Dose Route Frequency Provider Last Rate Last Admin   escitalopram (LEXAPRO) tablet 10 mg  10 mg  Oral Daily Dionne Bucy, MD   10 mg at 04/06/23 1659   gabapentin (NEURONTIN) capsule 600 mg  600 mg Oral BID Dionne Bucy, MD   600 mg at 04/06/23 2149   Followed by   Melene Muller ON 04/27/2023] gabapentin (NEURONTIN) capsule 600 mg  600 mg Oral TID Dionne Bucy, MD       insulin aspart (novoLOG) injection 0-5 Units  0-5 Units Subcutaneous QHS Dionne Bucy, MD       insulin aspart  (novoLOG) injection 0-9 Units  0-9 Units Subcutaneous TID WC Dionne Bucy, MD   9 Units at 04/06/23 1701   insulin aspart (novoLOG) injection 6 Units  6 Units Subcutaneous TID WC Dionne Bucy, MD   6 Units at 04/06/23 1701   insulin glargine-yfgn (SEMGLEE) injection 36 Units  36 Units Subcutaneous QHS Sharman Cheek, MD   36 Units at 04/06/23 2159   levETIRAcetam (KEPPRA) tablet 500 mg  500 mg Oral BID Dionne Bucy, MD   500 mg at 04/06/23 1659   multivitamin with minerals tablet 1 tablet  1 tablet Oral Daily Dionne Bucy, MD   1 tablet at 04/06/23 1659   nicotine (NICODERM CQ - dosed in mg/24 hr) patch 7 mg  7 mg Transdermal Daily Dionne Bucy, MD   7 mg at 04/06/23 1705   Current Outpatient Medications  Medication Sig Dispense Refill   insulin lispro (HUMALOG) 100 UNIT/ML KwikPen Inject 0-50 Units into the skin as directed.     LANTUS SOLOSTAR 100 UNIT/ML Solostar Pen Inject 55 Units into the skin at bedtime. 15 mL 3   triamcinolone cream (KENALOG) 0.1 % Apply 1 Application topically 2 (two) times daily.     escitalopram (LEXAPRO) 10 MG tablet Take 1 tablet (10 mg total) by mouth daily. (Patient not taking: Reported on 04/06/2023) 30 tablet 2   gabapentin (NEURONTIN) 300 MG capsule Take 2 capsules (600 mg total) by mouth 2 (two) times daily for 30 days, THEN 2 capsules (600 mg total) 3 (three) times daily. (Patient not taking: Reported on 04/06/2023) 480 capsule 0   levETIRAcetam (KEPPRA) 500 MG tablet Take 1 tablet (500 mg total) by mouth 2 (two) times daily. 60 tablet 0   Multiple Vitamin (MULTIVITAMIN WITH MINERALS) TABS tablet Take 1 tablet by mouth daily. (Patient not taking: Reported on 04/06/2023) 30 tablet 3   naltrexone (DEPADE) 50 MG tablet Take 1 tablet (50 mg total) by mouth daily. (Patient not taking: Reported on 02/21/2022) 30 tablet 0   nicotine (NICODERM CQ - DOSED IN MG/24 HR) 7 mg/24hr patch Place 1 patch (7 mg total) onto the skin daily. (Patient  not taking: Reported on 04/06/2023) 28 patch 0    Musculoskeletal: Strength & Muscle Tone: within normal limits Gait & Station: normal Patient leans: N/A  Psychiatric Specialty Exam: Physical Exam Vitals and nursing note reviewed.  Constitutional:      Appearance: Normal appearance.  HENT:     Head: Normocephalic.     Nose: Nose normal.  Pulmonary:     Effort: Pulmonary effort is normal.  Musculoskeletal:        General: Normal range of motion.     Cervical back: Normal range of motion.  Neurological:     General: No focal deficit present.     Mental Status: Jesus Ewing is alert and oriented to person, place, and time.     Review of Systems  Psychiatric/Behavioral:  Positive for depression and suicidal ideas. The patient is nervous/anxious.   All other systems  reviewed and are negative.   Blood pressure 106/70, pulse 93, temperature 98 F (36.7 C), temperature source Oral, resp. rate 18, SpO2 98%.There is no height or weight on file to calculate BMI.  General Appearance: Casual  Eye Contact:  Good  Speech:  Normal Rate  Volume:  Normal  Mood:  Euthymic  Affect: Mildly depressed   Thought Process:  Coherent  Orientation:  Full (Time, Place, and Person)  Thought Content:  Logical  Suicidal Thoughts:  No  Homicidal Thoughts:  No  Memory:  Immediate;   Good Recent;   Good Remote;   Good  Judgement:  Poor  Insight:  Lacking  Psychomotor Activity:  Normal  Concentration:  Concentration: Good and Attention Span: Good  Recall:  Good  Fund of Knowledge:  Fair  Language:  Good  Akathisia:  No  Handed:  Right  AIMS (if indicated):     Assets:  Communication Skills Desire for Improvement Financial Resources/Insurance Housing Social Support  ADL's:  Intact  Cognition:  WNL  Sleep:   Good      Physical Exam: Physical Exam Vitals and nursing note reviewed.  Constitutional:      Appearance: Normal appearance.  HENT:     Head: Normocephalic.     Nose: Nose normal.   Pulmonary:     Effort: Pulmonary effort is normal.  Musculoskeletal:        General: Normal range of motion.     Cervical back: Normal range of motion.  Neurological:     General: No focal deficit present.     Mental Status: Jesus Ewing is alert and oriented to person, place, and time.    Review of Systems  Psychiatric/Behavioral:  Positive for depression and suicidal ideas. The patient is nervous/anxious.   All other systems reviewed and are negative.  Blood pressure 106/70, pulse 93, temperature 98 F (36.7 C), temperature source Oral, resp. rate 18, SpO2 98%. There is no height or weight on file to calculate BMI.  Treatment Plan Summary: Major depressive disorder, recurrent, severe without psychosis  - Admit to Behavioral Health Inpatient Unit; IVC suicidal ideation, further evaluate mental health status              - Lexapro 10 mg PO every day (was not taking consistently at home)  Disposition: Recommend psychiatric Inpatient admission when medically cleared.  Nanine Means, NP 04/07/2023 9:50 AM

## 2023-04-07 NOTE — Plan of Care (Signed)
Problem: Education: Goal: Knowledge of [facility] Education information/materials will improve Outcome: Progressing Goal: Emotional status will improve Outcome: Progressing Goal: Mental status will improve Outcome: Progressing Goal: Verbalization of understanding the information provided will improve Outcome: Progressing   Problem: Activity: Goal: Interest or engagement in activities will improve Outcome: Progressing Goal: Sleeping patterns will improve Outcome: Progressing   Problem: Coping: Goal: Ability to verbalize frustrations and anger appropriately will improve Outcome: Progressing Goal: Ability to demonstrate self-control will improve Outcome: Progressing   Problem: Health Behavior/Discharge Planning: Goal: Identification of resources available to assist in meeting health care needs will improve Outcome: Progressing Goal: Compliance with treatment plan for underlying cause of condition will improve Outcome: Progressing   Problem: Physical Regulation: Goal: Ability to maintain clinical measurements within normal limits will improve Outcome: Progressing   Problem: Safety: Goal: Periods of time without injury will increase Outcome: Progressing   Problem: Education: Goal: Knowledge of General Education information will improve Description: Including pain rating scale, medication(s)/side effects and non-pharmacologic comfort measures Outcome: Progressing   Problem: Health Behavior/Discharge Planning: Goal: Ability to manage health-related needs will improve Outcome: Progressing   Problem: Clinical Measurements: Goal: Ability to maintain clinical measurements within normal limits will improve Outcome: Progressing Goal: Will remain free from infection Outcome: Progressing Goal: Diagnostic test results will improve Outcome: Progressing Goal: Respiratory complications will improve Outcome: Progressing Goal: Cardiovascular complication will be  avoided Outcome: Progressing   Problem: Activity: Goal: Risk for activity intolerance will decrease Outcome: Progressing   Problem: Nutrition: Goal: Adequate nutrition will be maintained Outcome: Progressing   Problem: Coping: Goal: Level of anxiety will decrease Outcome: Progressing   Problem: Elimination: Goal: Will not experience complications related to bowel motility Outcome: Progressing Goal: Will not experience complications related to urinary retention Outcome: Progressing   Problem: Pain Managment: Goal: General experience of comfort will improve Outcome: Progressing   Problem: Safety: Goal: Ability to remain free from injury will improve Outcome: Progressing   Problem: Skin Integrity: Goal: Risk for impaired skin integrity will decrease Outcome: Progressing   Problem: Education: Goal: Ability to describe self-care measures that may prevent or decrease complications (Diabetes Survival Skills Education) will improve Outcome: Progressing Goal: Individualized Educational Video(s) Outcome: Progressing   Problem: Cardiac: Goal: Ability to maintain an adequate cardiac output will improve Outcome: Progressing   Problem: Health Behavior/Discharge Planning: Goal: Ability to identify and utilize available resources and services will improve Outcome: Progressing Goal: Ability to manage health-related needs will improve Outcome: Progressing   Problem: Fluid Volume: Goal: Ability to achieve a balanced intake and output will improve Outcome: Progressing   Problem: Metabolic: Goal: Ability to maintain appropriate glucose levels will improve Outcome: Progressing   Problem: Nutritional: Goal: Maintenance of adequate nutrition will improve Outcome: Progressing Goal: Maintenance of adequate weight for body size and type will improve Outcome: Progressing   Problem: Respiratory: Goal: Will regain and/or maintain adequate ventilation Outcome: Progressing    Problem: Urinary Elimination: Goal: Ability to achieve and maintain adequate renal perfusion and functioning will improve Outcome: Progressing   Problem: Education: Goal: Ability to make informed decisions regarding treatment will improve Outcome: Progressing   Problem: Coping: Goal: Coping ability will improve Outcome: Progressing   Problem: Health Behavior/Discharge Planning: Goal: Identification of resources available to assist in meeting health care needs will improve Outcome: Progressing   Problem: Medication: Goal: Compliance with prescribed medication regimen will improve Outcome: Progressing   Problem: Self-Concept: Goal: Ability to disclose and discuss suicidal ideas will improve Outcome: Progressing  Goal: Will verbalize positive feelings about self Outcome: Progressing Note:

## 2023-04-08 LAB — GLUCOSE, CAPILLARY
Glucose-Capillary: 122 mg/dL — ABNORMAL HIGH (ref 70–99)
Glucose-Capillary: 64 mg/dL — ABNORMAL LOW (ref 70–99)
Glucose-Capillary: 257 mg/dL — ABNORMAL HIGH (ref 70–99)
Glucose-Capillary: 90 mg/dL (ref 70–99)
Glucose-Capillary: 139 mg/dL — ABNORMAL HIGH (ref 70–99)

## 2023-04-08 MED ORDER — GLUCERNA SHAKE PO LIQD
237.0000 mL | Freq: Three times a day (TID) | ORAL | Status: DC
  Administered 2023-04-08 – 2023-04-11 (×9): 237 mL via ORAL

## 2023-04-08 MED ORDER — TRAZODONE HCL 50 MG PO TABS
50.0000 mg | ORAL_TABLET | Freq: Every day | ORAL | Status: DC
  Administered 2023-04-08 – 2023-04-09 (×2): 50 mg via ORAL
  Filled 2023-04-08 (×2): qty 1

## 2023-04-08 NOTE — H&P (Signed)
Psychiatric Admission Assessment Adult  Patient Identification: Jesus Ewing MRN:  [id number] Date of Evaluation:  04/08/2023 Chief Complaint:  MDD (major depressive disorder), recurrent episode, severe (HCC) [F33.2] Principal Diagnosis: MDD (major depressive disorder), recurrent episode, severe (HCC) Diagnosis:  Principal Problem:   MDD (major depressive disorder), recurrent episode, severe (HCC)  History of Present Illness: The patient is a 22 year old Caucasian male admitted to inpatient behavioral health. He reports, "They said I wanted to kill myself, I don't remember saying that." " I would like something for sleep"The patient was evaluated in the ED for diabetic ketoacidosis (DKA), stating, "My blood sugar was 500."The patient admits, "I don't take my medication like I should, I forget to take my insulin."When asked about suicidal ideation, the patient mentions that "my friend shot and killed himself last year," but denies current suicidal or homicidal ideation. The patient acknowledges that his father has a handgun but states, "I don't touch it."Denies self-harm behaviors, anxiety, or agitation.Patient is alert and cooperative during the admission interview.No signs of agitation, anxiety, or distress observed. Denies suicidal/homicidal ideation Patient tested positive for amphetamines (non-medication-related).Keppra and comprehensive metabolic panel (CMP) are ordered for 04/10/2023.Trazodone 50 mg was changed to a scheduled dose for sleep.No further medication adjustments at this time. Chart and nursing notes reviewed Associated Signs/Symptoms: Depression Symptoms:  depressed mood, insomnia, difficulty concentrating, impaired memory, loss of energy/fatigue, (Hypo) Manic Symptoms:  Impulsivity, Anxiety Symptoms:  anxious moderate Psychotic Symptoms:   none noted PTSD Symptoms: Had a traumatic exposure:  friend committed suicide Total Time spent with patient: 2 hours  Past  Psychiatric History: depression  Is the patient at risk to self? No.  Has the patient been a risk to self in the past 6 months? Yes.    Has the patient been a risk to self within the distant past? No.  Is the patient a risk to others? No.  Has the patient been a risk to others in the past 6 months? No.  Has the patient been a risk to others within the distant past? No.   [place] Scale:  Flowsheet Row Admission (Current) from 04/07/2023 in [facility] INPATIENT BEH[facility]IC[facility] ED from 04/06/2023 in [facility] Emergency Department at [facility] ED from 04/05/2023 in [facility] Emergency Department at [facility]  C-SSRS RISK CATEGORY No Risk No Risk No Risk        Prior Inpatient Therapy: Yes.   If yes, describereports previous inpatient  Prior Outpatient Therapy: Yes.   If yes, describe: under mental health services   Alcohol Screening: Patient refused Alcohol Screening Tool: Yes 1. How often do you have a drink containing alcohol?: Never 2. How many drinks containing alcohol do you have on a typical day when you are drinking?: 1 or 2 3. How often do you have six or more drinks on one occasion?: Never AUDIT-C Score: 0 4. How often during the last year have you found that you were not able to stop drinking once you had started?: Never 5. How often during the last year have you failed to do what was normally expected from you because of drinking?: Never 6. How often during the last year have you needed a first drink in the morning to get yourself going after a heavy drinking session?: Never 7. How often during the last year have you had a feeling of guilt of remorse after drinking?: Never 8. How often during the last year have you been unable to remember what happened the night before because  you had been drinking?: Never 9. Have you or someone else been injured as a result of your drinking?: No 10. Has a relative or friend or a doctor or another health worker been concerned  about your drinking or suggested you cut down?: No Alcohol Use Disorder Identification Test Final Score (AUDIT): 0 Alcohol Brief Interventions/Follow-up: Alcohol education/Brief advice Substance Abuse History in the last 12 months:  Yes.   Consequences of Substance Abuse: Medical Consequences:  Diabetes Previous Psychotropic Medications: Yes  Psychological Evaluations: Yes  Past Medical History:  Past Medical History:  Diagnosis Date   Celiac disease    Diabetes mellitus without complication (HCC)     Past Surgical History:  Procedure Laterality Date   AMPUTATION TOE Right 11/10/2021   Procedure: AMPUTATION TOE;  Surgeon: Candelaria Stagers, DPM;  Location: [facility] ORS;  Service: Podiatry;  Laterality: Right;   Family History:  Family History  Problem Relation Age of Onset   Healthy Mother    Diabetes Neg Hx    Family Psychiatric  History: see above Tobacco Screening:  Social History   Tobacco Use  Smoking Status Every Day   Types: Cigarettes, E-cigarettes  Smokeless Tobacco Never  Tobacco Comments   Vape daily, don't smoke anymore.    BH Tobacco Counseling     Are you interested in Tobacco Cessation Medications?  No value filed. Counseled patient on smoking cessation:  No value filed. Reason Tobacco Screening Not Completed: No value filed.       Social History:  Social History   Substance and Sexual Activity  Alcohol Use Not Currently     Social History   Substance and Sexual Activity  Drug Use Not Currently   Types: Cocaine    Additional Social History: Marital status: Single Does patient have children?: No                        Allergies:  No Known Allergies Lab Results:  Results for orders placed or performed during the hospital encounter of 04/07/23 (from the past 48 hour(s))  Glucose, capillary     Status: Abnormal   Collection Time: 04/07/23  9:49 PM  Result Value Ref Range   Glucose-Capillary 246 (H) 70 - 99 mg/dL    Comment: Glucose  reference range applies only to samples taken after fasting for at least 8 hours.  Glucose, capillary     Status: Abnormal   Collection Time: 04/08/23  6:43 AM  Result Value Ref Range   Glucose-Capillary 64 (L) 70 - 99 mg/dL    Comment: Glucose reference range applies only to samples taken after fasting for at least 8 hours.  Glucose, capillary     Status: Abnormal   Collection Time: 04/08/23  7:27 AM  Result Value Ref Range   Glucose-Capillary 122 (H) 70 - 99 mg/dL    Comment: Glucose reference range applies only to samples taken after fasting for at least 8 hours.  Glucose, capillary     Status: Abnormal   Collection Time: 04/08/23 11:12 AM  Result Value Ref Range   Glucose-Capillary 257 (H) 70 - 99 mg/dL    Comment: Glucose reference range applies only to samples taken after fasting for at least 8 hours.    Blood Alcohol level:  Lab Results  Component Value Date   [facility] <10 04/06/2023   ETH <10 02/21/2022    Metabolic Disorder Labs:  Lab Results  Component Value Date   HGBA1C 10.2 (H) 02/21/2022  MPG 246.04 02/21/2022   MPG 231.69 11/09/2021   No results found for: "PROLACTIN" No results found for: "CHOL", "TRIG", "HDL", "CHOLHDL", "VLDL", "LDLCALC"  Current Medications: Current Facility-Administered Medications  Medication Dose Route Frequency Provider Last Rate Last Admin   acetaminophen (TYLENOL) tablet 650 mg  650 mg Oral Q6H PRN Dixon, Rashaun M, NP       alum & mag hydroxide-simeth (MAALOX/MYLANTA) 200-200-20 MG/5ML suspension 30 mL  30 mL Oral Q4H PRN Dixon, Rashaun M, NP       diphenhydrAMINE (BENADRYL) capsule 50 mg  50 mg Oral TID PRN Jearld Lesch, NP       Or   diphenhydrAMINE (BENADRYL) injection 50 mg  50 mg Intramuscular TID PRN Jearld Lesch, NP       escitalopram (LEXAPRO) tablet 10 mg  10 mg Oral Daily Durwin Nora, Rashaun M, NP   10 mg at 04/08/23 0843   feeding supplement (GLUCERNA SHAKE) (GLUCERNA SHAKE) liquid 237 mL  237 mL Oral TID BM Myriam Forehand, NP       gabapentin (NEURONTIN) capsule 600 mg  600 mg Oral BID Durwin Nora, Rashaun M, NP   600 mg at 04/08/23 6010   Followed by   Melene Muller ON 04/27/2023] gabapentin (NEURONTIN) capsule 600 mg  600 mg Oral TID Jearld Lesch, NP       haloperidol (HALDOL) tablet 5 mg  5 mg Oral TID PRN Jearld Lesch, NP       Or   haloperidol lactate (HALDOL) injection 5 mg  5 mg Intramuscular TID PRN Jearld Lesch, NP       hydrOXYzine (ATARAX) tablet 25 mg  25 mg Oral TID PRN Jearld Lesch, NP       insulin aspart (novoLOG) injection 0-5 Units  0-5 Units Subcutaneous QHS Jearld Lesch, NP   2 Units at 04/07/23 2155   insulin aspart (novoLOG) injection 0-9 Units  0-9 Units Subcutaneous TID WC Dixon, Rashaun M, NP   5 Units at 04/08/23 1242   insulin aspart (novoLOG) injection 6 Units  6 Units Subcutaneous TID WC Jearld Lesch, NP   6 Units at 04/08/23 1243   insulin glargine-yfgn (SEMGLEE) injection 36 Units  36 Units Subcutaneous QHS Jearld Lesch, NP   36 Units at 04/07/23 2156   levETIRAcetam (KEPPRA) tablet 500 mg  500 mg Oral BID Lerry Liner M, NP   500 mg at 04/08/23 0844   LORazepam (ATIVAN) tablet 2 mg  2 mg Oral TID PRN Jearld Lesch, NP       Or   LORazepam (ATIVAN) injection 2 mg  2 mg Intramuscular TID PRN Jearld Lesch, NP       magnesium hydroxide (MILK OF MAGNESIA) suspension 30 mL  30 mL Oral Daily PRN Jearld Lesch, NP       multivitamin with minerals tablet 1 tablet  1 tablet Oral Daily Jearld Lesch, NP   1 tablet at 04/08/23 0843   nicotine (NICODERM CQ - dosed in mg/24 hr) patch 7 mg  7 mg Transdermal Daily Jearld Lesch, NP       traZODone (DESYREL) tablet 50 mg  50 mg Oral QHS Myriam Forehand, NP       PTA Medications: Medications Prior to Admission  Medication Sig Dispense Refill Last Dose   escitalopram (LEXAPRO) 10 MG tablet Take 1 tablet (10 mg total) by mouth daily. (Patient not taking: Reported on 04/06/2023) 30 tablet 2  gabapentin (NEURONTIN)  300 MG capsule Take 2 capsules (600 mg total) by mouth 2 (two) times daily for 30 days, THEN 2 capsules (600 mg total) 3 (three) times daily. (Patient not taking: Reported on 04/06/2023) 480 capsule 0    insulin lispro (HUMALOG) 100 UNIT/ML KwikPen Inject 0-50 Units into the skin as directed.      LANTUS SOLOSTAR 100 UNIT/ML Solostar Pen Inject 55 Units into the skin at bedtime. 15 mL 3    levETIRAcetam (KEPPRA) 500 MG tablet Take 1 tablet (500 mg total) by mouth 2 (two) times daily. 60 tablet 0    Multiple Vitamin (MULTIVITAMIN WITH MINERALS) TABS tablet Take 1 tablet by mouth daily. (Patient not taking: Reported on 04/06/2023) 30 tablet 3    naltrexone (DEPADE) 50 MG tablet Take 1 tablet (50 mg total) by mouth daily. (Patient not taking: Reported on 02/21/2022) 30 tablet 0    nicotine (NICODERM CQ - DOSED IN MG/24 HR) 7 mg/24hr patch Place 1 patch (7 mg total) onto the skin daily. (Patient not taking: Reported on 04/06/2023) 28 patch 0    triamcinolone cream (KENALOG) 0.1 % Apply 1 Application topically 2 (two) times daily.       Musculoskeletal: Strength & Muscle Tone: within normal limits Gait & Station: normal Patient leans: N/A    Psychiatric Specialty Exam:  Presentation  General Appearance:  Casual  Eye Contact: Good  Speech: Clear and Coherent  Speech Volume: Normal  Handedness: Right   Mood and Affect  Mood:No data recorded Affect:No data recorded  Thought Process  Thought Processes: Coherent  Duration of Psychotic Symptoms: 30 days Past Diagnosis of Schizophrenia or Psychoactive disorder: No  Descriptions of Associations:Intact  Orientation:Full (Time, Place and Person)  Thought Content:WDL  Hallucinations:Hallucinations: None  Ideas of Reference:None  Suicidal Thoughts:Suicidal Thoughts: No  Homicidal Thoughts:Homicidal Thoughts: No   Sensorium  Memory: Immediate Fair  Judgment: Fair  Insight: Fair   Art therapist   Concentration: Fair  Attention Span: Fair  Recall: Fair  Fund of Knowledge: Good  Language: Good   Psychomotor Activity  Psychomotor Activity: Psychomotor Activity: Normal   Assets  Assets: Housing; Social Support   Sleep  Sleep: Sleep: Fair Number of Hours of Sleep: 6    Physical Exam: Blood pressure 111/81, pulse 79, temperature 97.6 F (36.4 C), temperature source Oral, resp. rate 19, height 5\' 11"  (1.803 m), weight 63.5 kg, SpO2 97%. Body mass index is 19.53 kg/m.  Treatment Plan Summary: Daily contact with patient to assess and evaluate symptoms and progress in treatment and Medication management 1.Continue monitoring the patient's blood glucose levels and ensure insulin adherence. 2.Consult with the diabetic educator for follow-up to address the patient's non-compliance with insulin therapy and provide education on diabetes management. 3.Ensure Keppra and CMP are administered on 04/10/2023 as ordered. 4. Continue Trazodone 50 mg, now scheduled for sleep, and monitor its effectiveness. 5.Provide education on the risks of amphetamine use, especially in combination with medical conditions like diabetes. 6.Encourage continued participation in therapy and group sessions to foster emotional support and build coping mechanisms. 7. Work with LCSW to work towards stable and successful transition to discharge 8.Continue to assess for any suicidal or homicidal ideation, despite the patient's current denials. 9.Monitor for mood changes, including potential agitation or anxiety, as the patient processes the death of his friend and his current admission    Observation Level/Precautions:  Continuous Observation 15 minute checks  Laboratory:  Chemistry Profile  Psychotherapy:    Medications:  Consultations:    Discharge Concerns:    Estimated LOS:  Other:     Physician Treatment Plan for Primary Diagnosis: MDD (major depressive disorder), recurrent episode,  severe (HCC) Long Term Goal(s): Improvement in symptoms so as ready for discharge  Short Term Goals: Ability to verbalize feelings will improve, Ability to disclose and discuss suicidal ideas, Ability to demonstrate self-control will improve, Ability to identify and develop effective coping behaviors will improve, Ability to maintain clinical measurements within normal limits will improve, Compliance with prescribed medications will improve, and Ability to identify triggers associated with substance abuse/mental health issues will improve  Physician Treatment Plan for Secondary Diagnosis: Principal Problem:   MDD (major depressive disorder), recurrent episode, severe (HCC)  Long Term Goal(s): Improvement in symptoms so as ready for discharge  Short Term Goals: Compliance with prescribed medications will improve and Ability to identify triggers associated with substance abuse/mental health issues will improve  I certify that inpatient services furnished can reasonably be expected to improve the patient's condition.    Myriam Forehand, NP 9/22/202412:52 PM

## 2023-04-08 NOTE — Group Note (Signed)
Date:  04/08/2023 Time:  2:16 PM  Group Topic/Focus:  Self Care- Focus ; To help enhance our well-being and maintain good mental health. Bec oming aware of how often, or how well, we practice self care activities can help [place] identify areas we are neglecting and improve upon them for better mental health. Group community meeting Outdoor recreation structure group.    Participation Level:  Did Not Attend   Jesus Ewing 04/08/2023, 2:16 PM

## 2023-04-08 NOTE — [facility] Suicide Risk Assessment (Signed)
[facility] INPATIENT:  Family/Significant Other Suicide Prevention Education  Suicide Prevention Education:  Education Completed; Jesus Ewing [phone], has been identified by the patient as the family member/significant other with whom the patient will be residing, and identified as the person(s) who will aid the patient in the event of a mental health crisis (suicidal ideations/suicide attempt).  With written consent from the patient, the family member/significant other has been provided the following suicide prevention education, prior to the and/or following the discharge of the patient.  The suicide prevention education provided includes the following: Suicide risk factors Suicide prevention and interventions National Suicide Hotline telephone number [facility] Fall assessment telephone number [facility] Emergency Assistance 911 [facility] and/or Residential Mobile Crisis Unit telephone number  Request made of family/significant other to: Remove weapons (e.g., guns, rifles, knives), all items previously/currently identified as safety concern.   Remove drugs/medications (over-the-counter, prescriptions, illicit drugs), all items previously/currently identified as a safety concern.  The family member/significant other verbalizes understanding of the suicide prevention education information provided.  The family member/significant other agrees to remove the items of safety concern listed above.  Jesus Ewing 04/08/2023, 1:25 PM

## 2023-04-08 NOTE — [facility] Suicide Risk Assessment (Signed)
[facility] Admission Suicide Risk Assessment   Nursing information obtained from:  Patient Demographic factors:  Male, Adolescent or young adult, Caucasian Current Mental Status:  NA Loss Factors:  Decline in physical health Historical Factors:  NA Risk Reduction Factors:  Sense of responsibility to family, Living with another person, especially a relative, Positive social support, Positive therapeutic relationship, Positive coping skills or problem solving skills  Total Time spent with patient: 2 hours Principal Problem: MDD (major depressive disorder), recurrent episode, severe (HCC) Diagnosis:  Principal Problem:   MDD (major depressive disorder), recurrent episode, severe (HCC)  Subjective Data: The patient is a 22 year old Caucasian male admitted to inpatient behavioral health. He reports, "They said I wanted to kill myself, I don't remember saying that." " I would like something for sleep"The patient was evaluated in the ED for diabetic ketoacidosis (DKA), stating, "My blood sugar was 500."The patient admits, "I don't take my medication like I should, I forget to take my insulin."When asked about suicidal ideation, the patient mentions that "my friend shot and killed himself last year," but denies current suicidal or homicidal ideation. The patient acknowledges that his father has a handgun but states, "I don't touch it."Denies self-harm behaviors, anxiety, or agitation.Patient is alert and cooperative during the admission interview.No signs of agitation, anxiety, or distress observed. Denies suicidal/homicidal ideation Patient tested positive for amphetamines (non-medication-related).Keppra and comprehensive metabolic panel (CMP) are ordered for 04/10/2023.Trazodone 50 mg was changed to a scheduled dose for sleep.No further medication adjustments at this time.   CLINICAL FACTORS:   Depression:   Impulsivity Alcohol/Substance Abuse/Dependencies Previous Psychiatric Diagnoses and  Treatments Medical Diagnoses and Treatments/Surgeries   Musculoskeletal: Strength & Muscle Tone: within normal limits Gait & Station: normal Patient leans: N/A  Psychiatric Specialty Exam:  Presentation  General Appearance: Casual  Eye Contact:Good  Speech:Clear and Coherent  Speech Volume:Normal  Handedness:Right   Mood and Affect  Mood:Labile Affect: Anxious  Thought Process  Thought Processes:Coherent  Descriptions of Associations:Intact  Orientation:Full (Time, Place and Person)  Thought Content:WDL  History of Schizophrenia/Schizoaffective disorder:No  Duration of Psychotic Symptoms:none noted Hallucinations:Hallucinations: None  Ideas of Reference:None  Suicidal Thoughts:Suicidal Thoughts: No  Homicidal Thoughts:Homicidal Thoughts: No   Sensorium  Memory:Immediate Fair  Judgment:Fair  Insight:Fair   Executive Functions  Concentration:Fair  Attention Span:Fair  Recall:Fair  Fund of Knowledge:Good  Language:Good   Psychomotor Activity  Psychomotor Activity:Psychomotor Activity: Normal   Assets  Assets:Housing; Social Support   Sleep  Sleep:Sleep: Fair Number of Hours of Sleep: 6    Physical Exam: Physical Exam ROS Blood pressure 111/81, pulse 79, temperature 97.6 F (36.4 C), temperature source Oral, resp. rate 19, height 5\' 11"  (1.803 m), weight 63.5 kg, SpO2 97%. Body mass index is 19.53 kg/m.   COGNITIVE FEATURES THAT CONTRIBUTE TO RISK:  None    SUICIDE RISK:   Mild:  Suicidal ideation of limited frequency, intensity, duration, and specificity.  There are no identifiable plans, no associated intent, mild dysphoria and related symptoms, good self-control (both objective and subjective assessment), few other risk factors, and identifiable protective factors, including available and accessible social support.  PLAN OF CARE:  1.Continue monitoring the patient's blood glucose levels and ensure insulin  adherence. 2.Consult with the diabetic educator for follow-up to address the patient's non-compliance with insulin therapy and provide education on diabetes management. 3.Ensure Keppra and CMP are administered on 04/10/2023 as ordered. 4. Continue Trazodone 50 mg, now scheduled for sleep, and monitor its effectiveness. 5.Provide education on the risks of  amphetamine use, especially in combination with medical conditions like diabetes. 6.Encourage continued participation in therapy and group sessions to foster emotional support and build coping mechanisms. 7. Work with LCSW to work towards stable and successful transition to discharge 8.Continue to assess for any suicidal or homicidal ideation, despite the patient's current denials. 9.Monitor for mood changes, including potential agitation or anxiety, as the patient processes the death of his friend and his current admission  I certify that inpatient services furnished can reasonably be expected to improve the patient's condition.   Myriam Forehand, NP 04/08/2023, 12:45 PM

## 2023-04-08 NOTE — Plan of Care (Signed)
PT denies SI / HI / AVH. No signs of distress or injury. Pt is appropriate and minimal during assessment. Pt is adherent with scheduled medications and Cbg checks. Pt is isolative to room. Staff will continue to monitor for safety q15.    Problem: Education: Goal: Knowledge of [place] [facility] Education information/materials will improve Outcome: Progressing Goal: Emotional status will improve Outcome: Progressing Goal: Mental status will improve Outcome: Progressing

## 2023-04-08 NOTE — Group Note (Signed)
Date:  04/08/2023 Time:  5:44 PM  Group Topic/Focus:  Structured group activity ; Art therapy/movie group therapy.    Participation Level:  Did Not Attend   Rosaura Carpenter 04/08/2023, 5:44 PM

## 2023-04-08 NOTE — Group Note (Unsigned)
Date:  04/08/2023 Time:  10:49 PM  Group Topic/Focus:  Rediscovering Joy:   The focus of this group is to explore various ways to relieve stress in a positive manner.     Participation Level:  {BHH PARTICIPATION GBTDV:76160}  Participation Quality:  {BHH PARTICIPATION QUALITY:22265}  Affect:  {BHH AFFECT:22266}  Cognitive:  {BHH COGNITIVE:22267}  Insight: {BHH Insight2:20797}  Engagement in Group:  {BHH ENGAGEMENT IN VPXTG:62694}  Modes of Intervention:  {BHH MODES OF INTERVENTION:22269}  Additional Comments:  ***  Ignazio Kincaid 04/08/2023, 10:49 PM

## 2023-04-08 NOTE — [facility] Counselor (Signed)
Adult Comprehensive Assessment  Patient ID: Jesus Ewing, male   DOB: 03-08-2001, 22 y.o.   MRN: [id number]  Information Source: Information source: Patient  Current Stressors:  Patient states their primary concerns and needs for treatment are:: Patient does not report any stressors at this time Patient states their goals for this hospitilization and ongoing recovery are:: None reported Educational / Learning stressors: None reported Employment / Job issues: Patient reportes just starting a new job last Tuesday, and that he likes it so far. Family Relationships: None reported Financial / Lack of resources (include bankruptcy): None reported Housing / Lack of housing: None reported Physical health (include injuries & life threatening diseases): Patient has type 1 diabetes Social relationships: None reported Substance abuse: None reported Bereavement / Loss: None reported  Living/Environment/Situation:  Living Arrangements: Alone Living conditions (as described by patient or guardian): "Good" Who else lives in the home?: Dog Jesus Ewing How long has patient lived in current situation?: 2 months What is atmosphere in current home: Comfortable  Family History:  Marital status: Single Does patient have children?: No  Childhood History:  By whom was/is the patient raised?: Jesus Ewing Additional childhood history information: none reported Description of patient's relationship with caregiver when they were a child: "Good" Patient's description of current relationship with people who raised him/her: "Good support" How were you disciplined when you got in trouble as a child/adolescent?: "I can't remember" Did patient suffer any verbal/emotional/physical/sexual abuse as a child?: No Did patient suffer from severe childhood neglect?: No Has patient ever been sexually abused/assaulted/raped as an adolescent or adult?: No Was the patient ever a victim of a crime or a disaster?: No Witnessed  domestic violence?: No Has patient been affected by domestic violence as an adult?: No  Education:  Highest grade of school patient has completed: 12 grade Currently a student?: No Learning disability?: No  Employment/Work Situation:   Employment Situation: Employed How Long has Patient Been Employed?: 4 days Are You Satisfied With Your Job?: Yes Do You Work More Than One Job?: No Work Stressors: None reported Patient's Job has Been Impacted by Current Illness: No What is the Longest Time Patient has Held a Job?: 8 years Where was the Patient Employed at that Time?: [employer] company Has Patient ever Been in the [employer]?: No  Financial Resources:   Financial resources: Income from employment Does patient have a representative payee or guardian?: No  Alcohol/Substance Abuse:   What has been your use of drugs/alcohol within the last 12 months?: None reported If attempted suicide, did drugs/alcohol play a role in this?: No Alcohol/Substance Abuse Treatment Hx: Denies past history Has alcohol/substance abuse ever caused legal problems?: No  Social Support System:   Patient's Community Support System: Good Describe Community Support System: Jesus Ewing, Jesus Ewing and friends Type of faith/religion: Ephriam Knuckles How does patient's faith help to cope with current illness?: " I pray sometimes"  Leisure/Recreation:   Do You Have Hobbies?: Yes Leisure and Hobbies: I enjoy spending time with my dog  Strengths/Needs:   What is the patient's perception of their strengths?: "computer programming, and computer science Patient states they can use these personal strengths during their treatment to contribute to their recovery: Yes Patient states these barriers may affect/interfere with their treatment: None reported Patient states these barriers may affect their return to the community: None reported Other important information patient would like considered in planning for their treatment: Patient  would like to follow up with outpatient  mental health therapy in person or  vitual  Discharge Plan:   Currently receiving community mental health services: No Patient states concerns and preferences for aftercare planning are: None reported Patient states they will know when they are safe and ready for discharge when: "I am ready now" Does patient have access to transportation?: Yes Does patient have financial barriers related to discharge medications?: No Patient description of barriers related to discharge medications: None reported Will patient be returning to same living situation after discharge?: Yes  Summary/Recommendations:   Summary and Recommendations (to be completed by the evaluator): Patient is a 22 y/o male admitted on 04/07/2023 and diagnosed with Major Depressive Discorder with suicidal ideation to shoot himself. Patient lives alone with his emotional support dog Jesus Ewing, and his Jesus Ewing and Jesus Ewing is his support system. Patient denies having SI and states that he don't remember making that statement to harm himself as he was in DKA from his diabetes being out of control. Patient agrees to a referral for mental health outpatient therapy after discharge, he will be returning back to his apartment after discharge.Patient has transportation and the ability to pay for medications. While here, Jesus Ewing can benefit from crisis stabilization, medication management, therapeutic milieu, and referrals for services.  Starleen Arms. 04/08/2023

## 2023-04-09 DIAGNOSIS — F332 Major depressive disorder, recurrent severe without psychotic features: Secondary | ICD-10-CM | POA: Diagnosis not present

## 2023-04-09 LAB — GLUCOSE, CAPILLARY
Glucose-Capillary: 198 mg/dL — ABNORMAL HIGH (ref 70–99)
Glucose-Capillary: 209 mg/dL — ABNORMAL HIGH (ref 70–99)
Glucose-Capillary: 260 mg/dL — ABNORMAL HIGH (ref 70–99)
Glucose-Capillary: 118 mg/dL — ABNORMAL HIGH (ref 70–99)

## 2023-04-09 LAB — HEMOGLOBIN A1C
Hgb A1c MFr Bld: 12 % — ABNORMAL HIGH (ref 4.8–5.6)
Mean Plasma Glucose: 297.7 mg/dL

## 2023-04-09 NOTE — Group Note (Signed)
Date:  04/09/2023 Time:  6:39 PM  Group Topic/Focus:  the use of time outdoors in a manner designed for therapeutic refreshment of one's body or mind.  Music Therapy and Outdoor Recreation.    Participation Level:  Did Not Attend   Jesus Ewing 04/09/2023, 6:39 PM

## 2023-04-09 NOTE — BH IP Treatment Plan (Signed)
Interdisciplinary Treatment and Diagnostic Plan Update  04/09/2023 Time of Session: 9:28AM Jesus Ewing MRN: [id number]  Principal Diagnosis: MDD (major depressive disorder), recurrent episode, severe (HCC)  Secondary Diagnoses: Principal Problem:   MDD (major depressive disorder), recurrent episode, severe (HCC)   Current Medications:  Current Facility-Administered Medications  Medication Dose Route Frequency Provider Last Rate Last Admin   acetaminophen (TYLENOL) tablet 650 mg  650 mg Oral Q6H PRN Jearld Lesch, NP       alum & mag hydroxide-simeth (MAALOX/MYLANTA) 200-200-20 MG/5ML suspension 30 mL  30 mL Oral Q4H PRN Dixon, Rashaun M, NP       diphenhydrAMINE (BENADRYL) capsule 50 mg  50 mg Oral TID PRN Jearld Lesch, NP       Or   diphenhydrAMINE (BENADRYL) injection 50 mg  50 mg Intramuscular TID PRN Jearld Lesch, NP       escitalopram (LEXAPRO) tablet 10 mg  10 mg Oral Daily Dixon, Rashaun M, NP   10 mg at 04/09/23 0820   feeding supplement (GLUCERNA SHAKE) (GLUCERNA SHAKE) liquid 237 mL  237 mL Oral TID BM Myriam Forehand, NP   237 mL at 04/08/23 2138   gabapentin (NEURONTIN) capsule 600 mg  600 mg Oral BID Lerry Liner M, NP   600 mg at 04/09/23 4098   Followed by   Melene Muller ON 04/27/2023] gabapentin (NEURONTIN) capsule 600 mg  600 mg Oral TID Jearld Lesch, NP       haloperidol (HALDOL) tablet 5 mg  5 mg Oral TID PRN Jearld Lesch, NP       Or   haloperidol lactate (HALDOL) injection 5 mg  5 mg Intramuscular TID PRN Jearld Lesch, NP       hydrOXYzine (ATARAX) tablet 25 mg  25 mg Oral TID PRN Jearld Lesch, NP       insulin aspart (novoLOG) injection 0-5 Units  0-5 Units Subcutaneous QHS Jearld Lesch, NP   2 Units at 04/07/23 2155   insulin aspart (novoLOG) injection 0-9 Units  0-9 Units Subcutaneous TID WC Jearld Lesch, NP   2 Units at 04/09/23 0820   insulin aspart (novoLOG) injection 6 Units  6 Units Subcutaneous TID WC Jearld Lesch, NP   6 Units at 04/09/23 0820   insulin glargine-yfgn (SEMGLEE) injection 36 Units  36 Units Subcutaneous QHS Jearld Lesch, NP   36 Units at 04/08/23 2133   levETIRAcetam (KEPPRA) tablet 500 mg  500 mg Oral BID Lerry Liner M, NP   500 mg at 04/09/23 1191   LORazepam (ATIVAN) tablet 2 mg  2 mg Oral TID PRN Jearld Lesch, NP       Or   LORazepam (ATIVAN) injection 2 mg  2 mg Intramuscular TID PRN Jearld Lesch, NP       magnesium hydroxide (MILK OF MAGNESIA) suspension 30 mL  30 mL Oral Daily PRN Jearld Lesch, NP       multivitamin with minerals tablet 1 tablet  1 tablet Oral Daily Dixon, Rashaun M, NP   1 tablet at 04/09/23 0819   nicotine (NICODERM CQ - dosed in mg/24 hr) patch 7 mg  7 mg Transdermal Daily Dixon, Rashaun M, NP       traZODone (DESYREL) tablet 50 mg  50 mg Oral QHS Myriam Forehand, NP   50 mg at 04/08/23 2130   PTA Medications: Medications Prior to Admission  Medication Sig Dispense Refill Last Dose  escitalopram (LEXAPRO) 10 MG tablet Take 1 tablet (10 mg total) by mouth daily. (Patient not taking: Reported on 04/06/2023) 30 tablet 2    gabapentin (NEURONTIN) 300 MG capsule Take 2 capsules (600 mg total) by mouth 2 (two) times daily for 30 days, THEN 2 capsules (600 mg total) 3 (three) times daily. (Patient not taking: Reported on 04/06/2023) 480 capsule 0    insulin lispro (HUMALOG) 100 UNIT/ML KwikPen Inject 0-50 Units into the skin as directed.      LANTUS SOLOSTAR 100 UNIT/ML Solostar Pen Inject 55 Units into the skin at bedtime. 15 mL 3    levETIRAcetam (KEPPRA) 500 MG tablet Take 1 tablet (500 mg total) by mouth 2 (two) times daily. 60 tablet 0    Multiple Vitamin (MULTIVITAMIN WITH MINERALS) TABS tablet Take 1 tablet by mouth daily. (Patient not taking: Reported on 04/06/2023) 30 tablet 3    naltrexone (DEPADE) 50 MG tablet Take 1 tablet (50 mg total) by mouth daily. (Patient not taking: Reported on 02/21/2022) 30 tablet 0    nicotine (NICODERM CQ - DOSED IN  MG/24 HR) 7 mg/24hr patch Place 1 patch (7 mg total) onto the skin daily. (Patient not taking: Reported on 04/06/2023) 28 patch 0    triamcinolone cream (KENALOG) 0.1 % Apply 1 Application topically 2 (two) times daily.       Patient Stressors: Health problems   Loss of toe/amputation    Patient Strengths: Ability for insight  Education administrator  Supportive family/friends   Treatment Modalities: Medication Management, Group therapy, Case management,  1 to 1 session with clinician, Psychoeducation, Recreational therapy.   Physician Treatment Plan for Primary Diagnosis: MDD (major depressive disorder), recurrent episode, severe (HCC) Long Term Goal(s): Improvement in symptoms so as ready for discharge   Short Term Goals: Compliance with prescribed medications will improve Ability to identify triggers associated with substance abuse/mental health issues will improve Ability to verbalize feelings will improve Ability to disclose and discuss suicidal ideas Ability to demonstrate self-control will improve Ability to identify and develop effective coping behaviors will improve Ability to maintain clinical measurements within normal limits will improve  Medication Management: Evaluate patient's response, side effects, and tolerance of medication regimen.  Therapeutic Interventions: 1 to 1 sessions, Unit Group sessions and Medication administration.  Evaluation of Outcomes: Progressing  Physician Treatment Plan for Secondary Diagnosis: Principal Problem:   MDD (major depressive disorder), recurrent episode, severe (HCC)  Long Term Goal(s): Improvement in symptoms so as ready for discharge   Short Term Goals: Compliance with prescribed medications will improve Ability to identify triggers associated with substance abuse/mental health issues will improve Ability to verbalize feelings will improve Ability to disclose and discuss suicidal ideas Ability to demonstrate  self-control will improve Ability to identify and develop effective coping behaviors will improve Ability to maintain clinical measurements within normal limits will improve     Medication Management: Evaluate patient's response, side effects, and tolerance of medication regimen.  Therapeutic Interventions: 1 to 1 sessions, Unit Group sessions and Medication administration.  Evaluation of Outcomes: Progressing   RN Treatment Plan for Primary Diagnosis: MDD (major depressive disorder), recurrent episode, severe (HCC) Long Term Goal(s): Knowledge of disease and therapeutic regimen to maintain health will improve  Short Term Goals: Ability to demonstrate self-control, Ability to participate in decision making will improve, Ability to verbalize feelings will improve, Ability to disclose and discuss suicidal ideas, Ability to identify and develop effective coping behaviors will improve, and Compliance with  prescribed medications will improve  Medication Management: RN will administer medications as ordered by provider, will assess and evaluate patient's response and provide education to patient for prescribed medication. RN will report any adverse and/or side effects to prescribing provider.  Therapeutic Interventions: 1 on 1 counseling sessions, Psychoeducation, Medication administration, Evaluate responses to treatment, Monitor vital signs and CBGs as ordered, Perform/monitor CIWA, COWS, AIMS and Fall Risk screenings as ordered, Perform wound care treatments as ordered.  Evaluation of Outcomes: Progressing   LCSW Treatment Plan for Primary Diagnosis: MDD (major depressive disorder), recurrent episode, severe (HCC) Long Term Goal(s): Safe transition to appropriate next level of care at discharge, Engage patient in therapeutic group addressing interpersonal concerns.  Short Term Goals: Engage patient in aftercare planning with referrals and resources, Increase social support, Increase ability to  appropriately verbalize feelings, Increase emotional regulation, Facilitate acceptance of mental health diagnosis and concerns, and Increase skills for wellness and recovery  Therapeutic Interventions: Assess for all discharge needs, 1 to 1 time with Social worker, Explore available resources and support systems, Assess for adequacy in community support network, Educate family and significant other(s) on suicide prevention, Complete Psychosocial Assessment, Interpersonal group therapy.  Evaluation of Outcomes: Progressing   Progress in Treatment: Attending groups: Yes. Participating in groups: Yes. Taking medication as prescribed: Yes. Toleration medication: Yes. Family/Significant other contact made: Yes, individual(s) contacted:  SPE completed with the patient's grandfather. Patient understands diagnosis: Yes. Discussing patient identified problems/goals with staff: Yes. Medical problems stabilized or resolved: Yes. Denies suicidal/homicidal ideation: Yes. Issues/concerns per patient self-inventory: No. Other: none  New problem(s) identified: No, Describe:  none  New Short Term/Long Term Goal(s): detox, elimination of symptoms of psychosis, medication management for mood stabilization; elimination of SI thoughts; development of comprehensive mental wellness/sobriety plan.   Patient Goals:  "get out of here"  Discharge Plan or Barriers: Patient reports plans to return to his home at discharge.  He reports plans to begin services with a provider at discharge.    Reason for Continuation of Hospitalization: Anxiety Depression Medication stabilization Suicidal ideation  Estimated Length of Stay:  1-7 days  Last 3 [place] Suicide Severity Risk Score: Flowsheet Row Admission (Current) from 04/07/2023 in [facility] ED from 04/06/2023 in [facility] Emergency Department at [facility] ED from 04/05/2023 in [facility] Emergency Department at [facility]   C-SSRS RISK CATEGORY No Risk No Risk No Risk       Last PHQ 2/9 Scores:     No data to display          Scribe for Treatment Team: Harden Mo, LCSW 04/09/2023 10:36 AM

## 2023-04-09 NOTE — Group Note (Signed)
Date:  04/09/2023 Time:  12:57 AM  Group Topic/Focus:  Rediscovering Joy:   The focus of this group is to explore various ways to relieve stress in a positive manner.    Participation Level:  Active  Participation Quality:  Appropriate and Attentive  Affect:  Appropriate  Cognitive:  Alert and Appropriate  Insight: Appropriate and Good  Engagement in Group:  Developing/Improving and Engaged  Modes of Intervention:  Rapport Building and Socialization  Additional Comments:     Jesus Ewing 04/09/2023, 12:57 AM

## 2023-04-09 NOTE — Plan of Care (Signed)
Patient Pleasant and cooperative on approach. Patient states that he is always hungry.Denies SI,HI and AVH. Patient stayed in bed till this afternoon. Visible in the milieu. Appetite and energy level good. Support and encouragement given.

## 2023-04-09 NOTE — Group Note (Signed)
Date:  04/09/2023 Time:  9:44 PM  Group Topic/Focus:  Personal Choices and Values:   The focus of this group is to help patients assess and explore the importance of values in their lives, how their values affect their decisions, how they express their values and what opposes their expression.    Participation Level:  Active  Participation Quality:  Appropriate and Attentive  Affect:  Appropriate  Cognitive:  Alert and Appropriate  Insight: Appropriate and Good  Engagement in Group:  Developing/Improving  Modes of Intervention:  Clarification, Discussion, Education, and Support  Additional Comments:     Noel Rodier 04/09/2023, 9:44 PM

## 2023-04-09 NOTE — Group Note (Signed)
Date:  04/09/2023 Time:  10:08 AM  Group Topic/Focus:  Outdoor Recreation/Building therapeutic rapport.    Participation Level:  Active  Participation Quality:  Appropriate and Attentive  Affect:  Appropriate  Cognitive:  Alert, Appropriate, and Oriented  Insight: Appropriate  Engagement in Group:  Developing/Improving, Engaged, and Improving  Modes of Intervention:  Activity  Additional Comments:    Jesus Ewing 04/09/2023, 10:08 AM

## 2023-04-09 NOTE — Progress Notes (Signed)
[facility] MD Progress Note  04/09/2023  Jesus Ewing  MRN:  [id number]   The patient is a 22 year old Caucasian male admitted secondary to suicidal thoughts  Subjective: Chart reviewed, patient discussed in multidisciplinary team today, patient seen during rounds today.  Patient reports he is feeling better.  He denies thoughts of harming himself or others.  Patient reports managing diabetes has been an "ongoing problem, I sometimes forget to take my insulin, I am working on it".  When questioned about the gun, patient did not give any definitive or reliable answer as to where the gun is.  Initially he said that he sold the gun to his friend.  When questioned about friend's name, patient said I sold through third-party.  Patient was asked about the information on third-party, patient said" I do not have that information".  Later patient said" is it still selling a gun if I did not get any money from them".  Patient was advised to come forth with the information regarding gun.  He was informed that this is a liability, he needs to let the team know where the gun is.  Patient said he will work on it.  He reports he slept good last night.  He was encouraged to attend groups and work on coping strategies and a safe discharge plan.  Principal Problem: MDD (major depressive disorder), recurrent episode, severe (HCC) Diagnosis: Principal Problem:   MDD (major depressive disorder), recurrent episode, severe (HCC)    Past Medical History:  Past Medical History:  Diagnosis Date   Celiac disease    Diabetes mellitus without complication (HCC)     Past Surgical History:  Procedure Laterality Date   AMPUTATION TOE Right 11/10/2021   Procedure: AMPUTATION TOE;  Surgeon: Candelaria Stagers, DPM;  Location: [facility] ORS;  Service: Podiatry;  Laterality: Right;   Family History:  Family History  Problem Relation Age of Onset   Healthy Mother    Diabetes Neg Hx    Social History:  Social History    Substance and Sexual Activity  Alcohol Use Not Currently     Social History   Substance and Sexual Activity  Drug Use Not Currently   Types: Cocaine    Social History   Socioeconomic History   Marital status: Single    Spouse name: Not on file   Number of children: Not on file   Years of education: Not on file   Highest education level: Not on file  Occupational History   Not on file  Tobacco Use   Smoking status: Every Day    Types: Cigarettes, E-cigarettes   Smokeless tobacco: Never   Tobacco comments:    Vape daily, don't smoke anymore.  Vaping Use   Vaping status: Every Day  Substance and Sexual Activity   Alcohol use: Not Currently   Drug use: Not Currently    Types: Cocaine   Sexual activity: Not on file  Other Topics Concern   Not on file  Social History Narrative   Not on file   Social Determinants of Health   Financial Resource Strain: Low Risk  (01/25/2023)   Received from [facility]   Overall Financial Resource Strain (CARDIA)    Difficulty of Paying Living Expenses: Not hard at all  Food Insecurity: No Food Insecurity (04/07/2023)   Hunger Vital Sign    Worried About Running Out of Food in the Last Year: Never true    Ran Out of Food in the Last Year:  Never true  Transportation Needs: No Transportation Needs (04/07/2023)   PRAPARE - Administrator, Civil Service (Medical): No    Lack of Transportation (Non-Medical): No  Recent Concern: Transportation Needs - Unmet Transportation Needs (01/25/2023)   Received from [facility] - Transportation    In the past 12 months, has lack of transportation kept you from medical appointments or from getting medications?: Yes    Lack of Transportation (Non-Medical): Yes  Physical Activity: Inactive (08/12/2020)   Received from [facility]   Exercise Vital Sign    Days of Exercise per Week: 0 days     Minutes of Exercise per Session: 0 min  Stress: Not on file  Social Connections: Not on file   Additional Social History:                         Sleep: Fair  Appetite:  Fair  Current Medications: Current Facility-Administered Medications  Medication Dose Route Frequency Provider Last Rate Last Admin   acetaminophen (TYLENOL) tablet 650 mg  650 mg Oral Q6H PRN Jearld Lesch, NP       alum & mag hydroxide-simeth (MAALOX/MYLANTA) 200-200-20 MG/5ML suspension 30 mL  30 mL Oral Q4H PRN Dixon, Rashaun M, NP       diphenhydrAMINE (BENADRYL) capsule 50 mg  50 mg Oral TID PRN Jearld Lesch, NP       Or   diphenhydrAMINE (BENADRYL) injection 50 mg  50 mg Intramuscular TID PRN Jearld Lesch, NP       escitalopram (LEXAPRO) tablet 10 mg  10 mg Oral Daily Dixon, Rashaun M, NP   10 mg at 04/09/23 0820   feeding supplement (GLUCERNA SHAKE) (GLUCERNA SHAKE) liquid 237 mL  237 mL Oral TID BM Myriam Forehand, NP   237 mL at 04/08/23 2138   gabapentin (NEURONTIN) capsule 600 mg  600 mg Oral BID Lerry Liner M, NP   600 mg at 04/09/23 1610   Followed by   Melene Muller ON 04/27/2023] gabapentin (NEURONTIN) capsule 600 mg  600 mg Oral TID Jearld Lesch, NP       haloperidol (HALDOL) tablet 5 mg  5 mg Oral TID PRN Jearld Lesch, NP       Or   haloperidol lactate (HALDOL) injection 5 mg  5 mg Intramuscular TID PRN Jearld Lesch, NP       hydrOXYzine (ATARAX) tablet 25 mg  25 mg Oral TID PRN Jearld Lesch, NP       insulin aspart (novoLOG) injection 0-5 Units  0-5 Units Subcutaneous QHS Dixon, Rashaun M, NP   2 Units at 04/07/23 2155   insulin aspart (novoLOG) injection 0-9 Units  0-9 Units Subcutaneous TID WC Dixon, Rashaun M, NP   2 Units at 04/09/23 0820   insulin aspart (novoLOG) injection 6 Units  6 Units Subcutaneous TID WC Dixon, Elray Buba, NP   6 Units at 04/09/23 0820   insulin glargine-yfgn (SEMGLEE) injection 36 Units  36 Units Subcutaneous QHS Lerry Liner M, NP   36  Units at 04/08/23 2133   levETIRAcetam (KEPPRA) tablet 500 mg  500 mg Oral BID Lerry Liner M, NP   500 mg at 04/09/23 0819   LORazepam (ATIVAN) tablet 2 mg  2 mg Oral TID PRN Jearld Lesch, NP       Or   LORazepam (ATIVAN) injection 2  mg  2 mg Intramuscular TID PRN Jearld Lesch, NP       magnesium hydroxide (MILK OF MAGNESIA) suspension 30 mL  30 mL Oral Daily PRN Jearld Lesch, NP       multivitamin with minerals tablet 1 tablet  1 tablet Oral Daily Durwin Nora, Rashaun M, NP   1 tablet at 04/09/23 1601   nicotine (NICODERM CQ - dosed in mg/24 hr) patch 7 mg  7 mg Transdermal Daily Dixon, Rashaun M, NP       traZODone (DESYREL) tablet 50 mg  50 mg Oral QHS Myriam Forehand, NP   50 mg at 04/08/23 2130    Lab Results:  Results for orders placed or performed during the hospital encounter of 04/07/23 (from the past 48 hour(s))  Glucose, capillary     Status: Abnormal   Collection Time: 04/07/23  9:49 PM  Result Value Ref Range   Glucose-Capillary 246 (H) 70 - 99 mg/dL    Comment: Glucose reference range applies only to samples taken after fasting for at least 8 hours.  Glucose, capillary     Status: Abnormal   Collection Time: 04/08/23  6:43 AM  Result Value Ref Range   Glucose-Capillary 64 (L) 70 - 99 mg/dL    Comment: Glucose reference range applies only to samples taken after fasting for at least 8 hours.  Glucose, capillary     Status: Abnormal   Collection Time: 04/08/23  7:27 AM  Result Value Ref Range   Glucose-Capillary 122 (H) 70 - 99 mg/dL    Comment: Glucose reference range applies only to samples taken after fasting for at least 8 hours.  Glucose, capillary     Status: Abnormal   Collection Time: 04/08/23 11:12 AM  Result Value Ref Range   Glucose-Capillary 257 (H) 70 - 99 mg/dL    Comment: Glucose reference range applies only to samples taken after fasting for at least 8 hours.  Glucose, capillary     Status: None   Collection Time: 04/08/23  4:40 PM  Result Value  Ref Range   Glucose-Capillary 90 70 - 99 mg/dL    Comment: Glucose reference range applies only to samples taken after fasting for at least 8 hours.  Glucose, capillary     Status: Abnormal   Collection Time: 04/08/23  9:32 PM  Result Value Ref Range   Glucose-Capillary 139 (H) 70 - 99 mg/dL    Comment: Glucose reference range applies only to samples taken after fasting for at least 8 hours.  Glucose, capillary     Status: Abnormal   Collection Time: 04/09/23  6:25 AM  Result Value Ref Range   Glucose-Capillary 198 (H) 70 - 99 mg/dL    Comment: Glucose reference range applies only to samples taken after fasting for at least 8 hours.    Blood Alcohol level:  Lab Results  Component Value Date   ETH <10 04/06/2023   ETH <10 02/21/2022    Metabolic Disorder Labs: Lab Results  Component Value Date   HGBA1C 10.2 (H) 02/21/2022   MPG 246.04 02/21/2022   MPG 231.69 11/09/2021   No results found for: "PROLACTIN" No results found for: "CHOL", "TRIG", "HDL", "CHOLHDL", "VLDL", "LDLCALC"  Physical Findings: AIMS:  , ,  ,  ,    CIWA:    COWS:     Musculoskeletal: Strength & Muscle Tone: within normal limits Gait & Station: normal Patient leans: N/A  Psychiatric Specialty Exam:  Presentation  General Appearance:  Casual  Eye Contact: Good  Speech: Clear and Coherent  Speech Volume: Normal  Handedness: Right   Mood and Affect  Mood:" good"  Affect:Somewhat constricted, guarded  Thought Process  Thought Processes: Coherent  Descriptions of Associations:Intact  Orientation:Full (Time, Place and Person)  Thought Content:WDL  History of Schizophrenia/Schizoaffective disorder:No  Duration of Psychotic Symptoms:NA Hallucinations:Hallucinations: None  Ideas of Reference:None  Suicidal Thoughts:Suicidal Thoughts: No  Homicidal Thoughts:Homicidal Thoughts: No   Sensorium  Memory: Immediate Fair  Judgment: Limited  Insight: Limited   Executive  Functions  Concentration: Fair  Attention Span: Fair  Recall: Fair  Fund of Knowledge: Good  Language: Good   Psychomotor Activity  Psychomotor Activity: Psychomotor Activity: Normal   Assets  Assets: Housing; Social Support   Sleep  Sleep: Sleep: Fair Number of Hours of Sleep: 6    Physical Exam: Physical Exam Constitutional:      Appearance: Normal appearance.  HENT:     Head: Normocephalic and atraumatic.     Nose: Nose normal.  Eyes:     Pupils: Pupils are equal, round, and reactive to light.  Cardiovascular:     Rate and Rhythm: Normal rate.  Pulmonary:     Effort: Pulmonary effort is normal.  Skin:    General: Skin is warm.  Neurological:     Mental Status: He is alert and oriented to person, place, and time.    Review of Systems  Constitutional:  Negative for chills and fever.  HENT:  Negative for congestion and hearing loss.   Eyes:  Negative for blurred vision and double vision.  Respiratory:  Negative for cough and shortness of breath.   Cardiovascular:  Negative for chest pain and palpitations.  Gastrointestinal:  Negative for nausea and vomiting.  Neurological:  Negative for speech change.  Psychiatric/Behavioral:  Negative for depression and suicidal ideas.    Blood pressure 112/71, pulse 89, temperature 98.1 F (36.7 C), resp. rate 17, height 5\' 11"  (1.803 m), weight 63.5 kg, SpO2 99%. Body mass index is 19.53 kg/m.   Treatment Plan Summary: Daily contact with patient to assess and evaluate symptoms and progress in treatment and Medication management  Patient is admitted to locked unit under safety precautions Continue Lexapro 10 mg by mouth daily for depression Will continue on insulin for diabetes mellitus, will consult hospitalist if warranted Will continue on Keppra for seizures Patient was encouraged to attend group and work on coping strategies and a safe discharge plan Social worker consulted to get collateral and help  with a safe discharge plan including securing the gun Estimated length of stay 3 to 4 days  Lewanda Rife, MD 04/09/2023, 9:39 AM

## 2023-04-09 NOTE — Group Note (Signed)
Recreation Therapy Group Note   Group Topic:Coping Skills  Group Date: 04/09/2023 Start Time: 1000 End Time: 1050 Facilitators: Rosina Lowenstein, LRT, CTRS Location:  Craft Room  Group Description: Mind Map.  Patient was provided a blank template of a diagram with 32 blank boxes in a tiered system, branching from the center (similar to a bubble chart). LRT directed patients to label the middle of the diagram "Coping Skills". LRT and patients then came up with 8 different coping skills as examples. Pt were directed to record their coping skills in the 2nd tier boxes closest to the center.  Patients would then share their coping skills with the group as LRT wrote them out. LRT gave a handout of 99 different coping skills at the end of group.   Goal Area(s) Addressed: Patients will be able to define "coping skills". Patient will identify new coping skills.  Patient will increase communication.   Affect/Mood: N/A   Participation Level: Did not attend    Clinical Observations/Individualized Feedback: Jesus Ewing did not attend group.  Plan: Continue to engage patient in RT group sessions 2-3x/week.   Rosina Lowenstein, LRT, CTRS 04/09/2023 11:23 AM

## 2023-04-09 NOTE — Inpatient Diabetes Management (Signed)
Inpatient Diabetes Program Recommendations  AACE/ADA: New Consensus Statement on Inpatient Glycemic Control   Target Ranges:  Prepandial:   less than 140 mg/dL      Peak postprandial:   less than 180 mg/dL (1-2 hours)      Critically ill patients:  140 - 180 mg/dL    Latest Reference Range & Units 04/08/23 06:43 04/08/23 07:27 04/08/23 11:12 04/08/23 16:40 04/08/23 21:32 04/09/23 06:25  Glucose-Capillary 70 - 99 mg/dL 64 (L) 161 (H) 096 (H) 90 139 (H) 198 (H)   Review of Glycemic Control  Diabetes history: DM1 (does NOT make any insulin; requires basal, correction, and carb coverage insulin) Outpatient Diabetes medications: Lantus 55 units QHS, Humalog for meal coverage and correction (1 unit for 8 grams of carbs, 1 unit drops glucose 50 mg/dl Current orders for Inpatient glycemic control: Semglee 36 units at bedtime, Novolog 0-9 units TID with meals, Novolog 0-5 units at bedtime, Novolog 6 units TID with meals  NOTE: Patient has hx of Type 1 DM and is well known to inpatient diabetes team due to frequency ED visits and hospitalizations. Patient sees [facility] for DM management and last seen Dr. Collene Schlichter on 01/25/23. Patient admitted to inpatient BH with major depressive disorder under IVC. Per consult note on 04/06/23 by Nanine Means, NP, patient's mother reported that she provider patient with all DM medications and supplies so there is no reason he is note taking his insulin consistently. Noted glucose 64 mg/ld on 04/08/23. Fasting CBG today 198 mg/dl at 0:45 am.  If patient has any further episodes of fasting hypoglycemia, may need to consider decreasing Semglee dose slightly. Will follow along.  Thanks, Orlando Penner, RN, MSN, CDCES Diabetes Coordinator Inpatient Diabetes Program [phone] (Team Pager from 8am to 5pm)

## 2023-04-09 NOTE — Plan of Care (Signed)
  Problem: Education: Goal: Mental status will improve Outcome: Progressing   Problem: Education: Goal: Mental status will improve Outcome: Progressing   Problem: Coping: Goal: Ability to verbalize frustrations and anger appropriately will improve Outcome: Progressing Goal: Ability to demonstrate self-control will improve Outcome: Progressing   Problem: Health Behavior/Discharge Planning: Goal: Compliance with treatment plan for underlying cause of condition will improve Outcome: Progressing

## 2023-04-09 NOTE — Progress Notes (Signed)
Patient spent most of the shift resting in the bed. He denies SI, HI & AVH. He was compliant with medications. No new behavioral issues to report on shift at this time.

## 2023-04-10 DIAGNOSIS — F332 Major depressive disorder, recurrent severe without psychotic features: Secondary | ICD-10-CM | POA: Diagnosis not present

## 2023-04-10 LAB — COMPREHENSIVE METABOLIC PANEL WITH GFR
ALT: 40 U/L (ref 0–44)
AST: 23 U/L (ref 15–41)
Albumin: 3.2 g/dL — ABNORMAL LOW (ref 3.5–5.0)
Alkaline Phosphatase: 70 U/L (ref 38–126)
Anion gap: 8 (ref 5–15)
BUN: 27 mg/dL — ABNORMAL HIGH (ref 6–20)
CO2: 31 mmol/L (ref 22–32)
Calcium: 8.7 mg/dL — ABNORMAL LOW (ref 8.9–10.3)
Chloride: 95 mmol/L — ABNORMAL LOW (ref 98–111)
Creatinine, Ser: 0.64 mg/dL (ref 0.61–1.24)
GFR, Estimated: >60 mL/min
Glucose, Bld: 260 mg/dL — ABNORMAL HIGH (ref 70–99)
Potassium: 4.3 mmol/L (ref 3.5–5.1)
Sodium: 134 mmol/L — ABNORMAL LOW (ref 135–145)
Total Bilirubin: 0.6 mg/dL (ref 0.3–1.2)
Total Protein: 6 g/dL — ABNORMAL LOW (ref 6.5–8.1)

## 2023-04-10 LAB — GLUCOSE, CAPILLARY
Glucose-Capillary: 366 mg/dL — ABNORMAL HIGH (ref 70–99)
Glucose-Capillary: 322 mg/dL — ABNORMAL HIGH (ref 70–99)
Glucose-Capillary: 151 mg/dL — ABNORMAL HIGH (ref 70–99)
Glucose-Capillary: 368 mg/dL — ABNORMAL HIGH (ref 70–99)
Glucose-Capillary: 457 mg/dL — ABNORMAL HIGH (ref 70–99)

## 2023-04-10 MED ORDER — TRAZODONE HCL 100 MG PO TABS
100.0000 mg | ORAL_TABLET | Freq: Every day | ORAL | Status: DC
  Administered 2023-04-10 – 2023-04-12 (×3): 100 mg via ORAL
  Filled 2023-04-10 (×3): qty 1

## 2023-04-10 NOTE — Inpatient Diabetes Management (Signed)
Inpatient Diabetes Program Recommendations  AACE/ADA: New Consensus Statement on Inpatient Glycemic Control   Target Ranges:  Prepandial:   less than 140 mg/dL      Peak postprandial:   less than 180 mg/dL (1-2 hours)      Critically ill patients:  140 - 180 mg/dL    Latest Reference Range & Units 04/09/23 06:25 04/09/23 12:23 04/09/23 16:49 04/09/23 20:39 04/10/23 06:17  Glucose-Capillary 70 - 99 mg/dL 671 (H) 245 (H) 809 (H) 118 (H) 366 (H)    Review of Glycemic Control  Diabetes history: DM1 (does NOT make any insulin; requires basal, correction, and carb coverage insulin) Outpatient Diabetes medications: Lantus 55 units QHS, Humalog for meal coverage and correction (1 unit for 8 grams of carbs, 1 unit drops glucose 50 mg/dl Current orders for Inpatient glycemic control: Semglee 36 units at bedtime, Novolog 0-9 units TID with meals, Novolog 0-5 units at bedtime, Novolog 6 units TID with meals   Inpatient Recommendation:  Insulin: Please consider increasing meal coverage to Novolog 8 units TID with meals.  Supplements: Noted patient is ordered Glucerna TID between meals. May want to re-evaluate if supplements are needed or not as they are contributing to hyperglycemia (given that patient has DM1 and does not make any insulin to cover carbs from supplement).  NOTE: Patient has hx of Type 1 DM and is well known to inpatient diabetes team due to frequency ED visits and hospitalizations. Patient sees [facility] for DM management and last seen Dr. Collene Schlichter on 01/25/23. Patient admitted to inpatient BH with major depressive disorder under IVC. Per consult note on 04/06/23 by Nanine Means, NP, patient's mother reported that she provider patient with all DM medications and supplies so there is no reason he is note taking his insulin consistently. Noted CBG 366 mg/dl today at 9:83 am. Question if patient ate or drank anything with carbohydrates after CBG checked last night at 20:39 when  CBG was 118 mg/dl.    Thanks, Orlando Penner, RN, MSN, CDCES Diabetes Coordinator Inpatient Diabetes Program [phone] (Team Pager from 8am to 5pm)

## 2023-04-10 NOTE — Group Note (Signed)
Date:  04/10/2023 Time:  10:01 AM  Group Topic/Focus:  Developing a Wellness Toolbox:   The focus of this group is to help patients develop a "wellness toolbox" with skills and strategies to promote recovery upon discharge.    Participation Level:  Did Not Attend   Mahima Hottle 04/10/2023, 10:01 AM

## 2023-04-10 NOTE — Group Note (Signed)
Recreation Therapy Group Note   Group Topic:Goal Setting  Group Date: 04/10/2023 Start Time: 1000 End Time: 1100 Facilitators: Rosina Lowenstein, LRT, CTRS Location:  Craft Room  Group Description: Vision Board. Patients were given many different magazines, a glue stick, markers, and a piece of cardstock paper. LRT and pts discussed the importance of having goals in life. LRT and pts discussed the difference between short-term and long-term goals, as well as what a SMART goal is. LRT encouraged pts to create a vision board, with images they picked and then cut out with safety scissors from the magazine, for themselves, that capture their short and long-term goals. LRT encouraged pts to show and explain their vision board to the group.   Goal Area(s) Addressed:  Patient will gain knowledge of short vs. long term goals.  Patient will identify goals for themselves. Patient will practice setting SMART goals. Patient will verbalize their goals to LRT and peers.   Affect/Mood: Appropriate   Participation Level: Active and Engaged   Participation Quality: Independent   Behavior: Appropriate, Calm, and Cooperative   Speech/Thought Process: Coherent   Insight: Good   Judgement: Good   Modes of Intervention: Art   Patient Response to Interventions:  Attentive, Engaged, Interested , and Receptive   Education Outcome:  Acknowledges education   Clinical Observations/Individualized Feedback: Jesus Ewing was active in their participation of session activities and group discussion. Pt identified "I want to have a healthy relationship, have hope through everything and have a good relationship with my dog" as his goals. Pt appropriately identified images to reflect these goals. Pt interacted well with LRT and peers duration of session.    Plan: Continue to engage patient in RT group sessions 2-3x/week.   Rosina Lowenstein, LRT, CTRS 04/10/2023 11:16 AM

## 2023-04-10 NOTE — Progress Notes (Signed)
Patient presents with flat affect but brightens on approach. Denies SI, HI, AVH. Medication compliant. Appropriate with staff and peers.Prn given for sleep with good relief.  Encouragement and support provided. Safety checks maintained. Medications given as prescribed. Patient receptive and remains safe on unit with q 15 min checks.

## 2023-04-10 NOTE — Plan of Care (Signed)
Patient pleasant and cooperative on approach. Denies SI,HI and AVH. Patient stated that he didn't sleep good last night. Patient appropriate with staff & peers.Patient states that he is always hungry. Support and encouragement given.

## 2023-04-10 NOTE — Plan of Care (Signed)
Problem: Education: Goal: Knowledge of [facility] Education information/materials will improve Outcome: Progressing Goal: Emotional status will improve Outcome: Progressing Goal: Mental status will improve Outcome: Progressing Goal: Verbalization of understanding the information provided will improve Outcome: Progressing   Problem: Activity: Goal: Interest or engagement in activities will improve Outcome: Progressing Goal: Sleeping patterns will improve Outcome: Progressing   Problem: Coping: Goal: Ability to verbalize frustrations and anger appropriately will improve Outcome: Progressing Goal: Ability to demonstrate self-control will improve Outcome: Progressing

## 2023-04-10 NOTE — Progress Notes (Signed)
[facility] MD Progress Note  04/10/2023 12:33 PM Jesus Ewing  MRN:  [id number] Subjective: Jesus Ewing is seen on rounds.  He presented in DKA and does not remember much of what happened.  He says it was not a suicide attempt.  He is currently on Lexapro and tolerating it.  He says that he is doing well.  He does not have an outpatient provider but understands that he is going to be going to [facility] in [place].  His biggest complaint is not sleeping very well so we will go up on his trazodone. Principal Problem: MDD (major depressive disorder), recurrent episode, severe (HCC) Diagnosis: Principal Problem:   MDD (major depressive disorder), recurrent episode, severe (HCC)  Total Time spent with patient: 15 minutes  Past Psychiatric History: Unremarkable  Past Medical History:  Past Medical History:  Diagnosis Date   Celiac disease    Diabetes mellitus without complication (HCC)     Past Surgical History:  Procedure Laterality Date   AMPUTATION TOE Right 11/10/2021   Procedure: AMPUTATION TOE;  Surgeon: Candelaria Stagers, DPM;  Location: [facility] ORS;  Service: Podiatry;  Laterality: Right;   Family History:  Family History  Problem Relation Age of Onset   Healthy Mother    Diabetes Neg Hx    Family Psychiatric  History: Unremarkable Social History:  Social History   Substance and Sexual Activity  Alcohol Use Not Currently     Social History   Substance and Sexual Activity  Drug Use Not Currently   Types: Cocaine    Social History   Socioeconomic History   Marital status: Single    Spouse name: Not on file   Number of children: Not on file   Years of education: Not on file   Highest education level: Not on file  Occupational History   Not on file  Tobacco Use   Smoking status: Every Day    Types: Cigarettes, E-cigarettes   Smokeless tobacco: Never   Tobacco comments:    Vape daily, don't smoke anymore.  Vaping Use   Vaping status: Every Day  Substance and Sexual  Activity   Alcohol use: Not Currently   Drug use: Not Currently    Types: Cocaine   Sexual activity: Not on file  Other Topics Concern   Not on file  Social History Narrative   Not on file   Social Determinants of Health   Financial Resource Strain: Low Risk  (01/25/2023)   Received from [facility]   Overall Financial Resource Strain (CARDIA)    Difficulty of Paying Living Expenses: Not hard at all  Food Insecurity: No Food Insecurity (04/07/2023)   Hunger Vital Sign    Worried About Running Out of Food in the Last Year: Never true    Ran Out of Food in the Last Year: Never true  Transportation Needs: No Transportation Needs (04/07/2023)   PRAPARE - Administrator, Civil Service (Medical): No    Lack of Transportation (Non-Medical): No  Recent Concern: Transportation Needs - Unmet Transportation Needs (01/25/2023)   Received from [facility] - Transportation    In the past 12 months, has lack of transportation kept you from medical appointments or from getting medications?: Yes    Lack of Transportation (Non-Medical): Yes  Physical Activity: Inactive (08/12/2020)   Received from [facility], [facility]   Exercise Vital Sign    Days of Exercise per Week: 0  days    Minutes of Exercise per Session: 0 min  Stress: Not on file  Social Connections: Not on file   Additional Social History:                         Sleep: Poor  Appetite:  Fair  Current Medications: Current Facility-Administered Medications  Medication Dose Route Frequency Provider Last Rate Last Admin   acetaminophen (TYLENOL) tablet 650 mg  650 mg Oral Q6H PRN Dixon, Rashaun M, NP       alum & mag hydroxide-simeth (MAALOX/MYLANTA) 200-200-20 MG/5ML suspension 30 mL  30 mL Oral Q4H PRN Dixon, Rashaun M, NP       diphenhydrAMINE (BENADRYL) capsule 50 mg  50 mg Oral TID PRN Jearld Lesch, NP       Or    diphenhydrAMINE (BENADRYL) injection 50 mg  50 mg Intramuscular TID PRN Jearld Lesch, NP       escitalopram (LEXAPRO) tablet 10 mg  10 mg Oral Daily Dixon, Rashaun M, NP   10 mg at 04/10/23 0817   feeding supplement (GLUCERNA SHAKE) (GLUCERNA SHAKE) liquid 237 mL  237 mL Oral TID BM Myriam Forehand, NP   237 mL at 04/10/23 1027   gabapentin (NEURONTIN) capsule 600 mg  600 mg Oral BID Lerry Liner M, NP   600 mg at 04/10/23 7829   Followed by   Melene Muller ON 04/27/2023] gabapentin (NEURONTIN) capsule 600 mg  600 mg Oral TID Jearld Lesch, NP       haloperidol (HALDOL) tablet 5 mg  5 mg Oral TID PRN Jearld Lesch, NP       Or   haloperidol lactate (HALDOL) injection 5 mg  5 mg Intramuscular TID PRN Jearld Lesch, NP       hydrOXYzine (ATARAX) tablet 25 mg  25 mg Oral TID PRN Jearld Lesch, NP       insulin aspart (novoLOG) injection 0-5 Units  0-5 Units Subcutaneous QHS Jearld Lesch, NP   2 Units at 04/07/23 2155   insulin aspart (novoLOG) injection 0-9 Units  0-9 Units Subcutaneous TID WC Dixon, Rashaun M, NP   6 Units at 04/10/23 1224   insulin aspart (novoLOG) injection 6 Units  6 Units Subcutaneous TID WC Jearld Lesch, NP   6 Units at 04/10/23 1225   insulin glargine-yfgn (SEMGLEE) injection 36 Units  36 Units Subcutaneous QHS Jearld Lesch, NP   36 Units at 04/09/23 2123   levETIRAcetam (KEPPRA) tablet 500 mg  500 mg Oral BID Lerry Liner M, NP   500 mg at 04/10/23 0816   LORazepam (ATIVAN) tablet 2 mg  2 mg Oral TID PRN Jearld Lesch, NP       Or   LORazepam (ATIVAN) injection 2 mg  2 mg Intramuscular TID PRN Jearld Lesch, NP       magnesium hydroxide (MILK OF MAGNESIA) suspension 30 mL  30 mL Oral Daily PRN Jearld Lesch, NP       multivitamin with minerals tablet 1 tablet  1 tablet Oral Daily Dixon, Rashaun M, NP   1 tablet at 04/10/23 0816   nicotine (NICODERM CQ - dosed in mg/24 hr) patch 7 mg  7 mg Transdermal Daily Lerry Liner M, NP       traZODone  (DESYREL) tablet 50 mg  50 mg Oral QHS Myriam Forehand, NP   50 mg at 04/09/23 2123  Lab Results:  Results for orders placed or performed during the hospital encounter of 04/07/23 (from the past 48 hour(s))  Glucose, capillary     Status: None   Collection Time: 04/08/23  4:40 PM  Result Value Ref Range   Glucose-Capillary 90 70 - 99 mg/dL    Comment: Glucose reference range applies only to samples taken after fasting for at least 8 hours.  Glucose, capillary     Status: Abnormal   Collection Time: 04/08/23  9:32 PM  Result Value Ref Range   Glucose-Capillary 139 (H) 70 - 99 mg/dL    Comment: Glucose reference range applies only to samples taken after fasting for at least 8 hours.  Glucose, capillary     Status: Abnormal   Collection Time: 04/09/23  6:25 AM  Result Value Ref Range   Glucose-Capillary 198 (H) 70 - 99 mg/dL    Comment: Glucose reference range applies only to samples taken after fasting for at least 8 hours.  Glucose, capillary     Status: Abnormal   Collection Time: 04/09/23 12:23 PM  Result Value Ref Range   Glucose-Capillary 209 (H) 70 - 99 mg/dL    Comment: Glucose reference range applies only to samples taken after fasting for at least 8 hours.  Glucose, capillary     Status: Abnormal   Collection Time: 04/09/23  4:49 PM  Result Value Ref Range   Glucose-Capillary 260 (H) 70 - 99 mg/dL    Comment: Glucose reference range applies only to samples taken after fasting for at least 8 hours.  Glucose, capillary     Status: Abnormal   Collection Time: 04/09/23  8:39 PM  Result Value Ref Range   Glucose-Capillary 118 (H) 70 - 99 mg/dL    Comment: Glucose reference range applies only to samples taken after fasting for at least 8 hours.  Glucose, capillary     Status: Abnormal   Collection Time: 04/10/23  6:17 AM  Result Value Ref Range   Glucose-Capillary 366 (H) 70 - 99 mg/dL    Comment: Glucose reference range applies only to samples taken after fasting for at least  8 hours.  Glucose, capillary     Status: Abnormal   Collection Time: 04/10/23  7:53 AM  Result Value Ref Range   Glucose-Capillary 322 (H) 70 - 99 mg/dL    Comment: Glucose reference range applies only to samples taken after fasting for at least 8 hours.  Glucose, capillary     Status: Abnormal   Collection Time: 04/10/23 11:43 AM  Result Value Ref Range   Glucose-Capillary 151 (H) 70 - 99 mg/dL    Comment: Glucose reference range applies only to samples taken after fasting for at least 8 hours.    Blood Alcohol level:  Lab Results  Component Value Date   ETH <10 04/06/2023   ETH <10 02/21/2022    Metabolic Disorder Labs: Lab Results  Component Value Date   HGBA1C 12.0 (H) 04/05/2023   MPG 297.7 04/05/2023   MPG 246.04 02/21/2022   No results found for: "PROLACTIN" No results found for: "CHOL", "TRIG", "HDL", "CHOLHDL", "VLDL", "LDLCALC"  Physical Findings: AIMS:  , ,  ,  ,    CIWA:    COWS:     Musculoskeletal: Strength & Muscle Tone: within normal limits Gait & Station: normal Patient leans: N/A  Psychiatric Specialty Exam:  Presentation  General Appearance:  Casual  Eye Contact: Good  Speech: Clear and Coherent  Speech Volume: Normal  Handedness:  Right   Mood and Affect  Mood:No data recorded Affect:No data recorded  Thought Process  Thought Processes: Coherent  Descriptions of Associations:Intact  Orientation:Full (Time, Place and Person)  Thought Content:WDL  History of Schizophrenia/Schizoaffective disorder:No  Duration of Psychotic Symptoms:No data recorded Hallucinations:No data recorded Ideas of Reference:None  Suicidal Thoughts:No data recorded Homicidal Thoughts:No data recorded  Sensorium  Memory: Immediate Fair  Judgment: Fair  Insight: Fair   Art therapist  Concentration: Fair  Attention Span: Fair  Recall: Fair  Fund of Knowledge: Good  Language: Good   Psychomotor Activity  Psychomotor  Activity:No data recorded  Assets  Assets: Housing; Social Support   Sleep  Sleep:No data recorded    Blood pressure 101/61, pulse 92, temperature 97.8 F (36.6 C), temperature source Oral, resp. rate 17, height 5\' 11"  (1.803 m), weight 63.5 kg, SpO2 97%. Body mass index is 19.53 kg/m.   Treatment Plan Summary: Daily contact with patient to assess and evaluate symptoms and progress in treatment, Medication management, and Plan increase trazodone to 100 mg.  Sarina Ill, DO 04/10/2023, 12:33 PM

## 2023-04-10 NOTE — Group Note (Signed)
Date:  04/10/2023 Time:  4:24 PM  Group Topic/Focus:  Outdoor recreation structured therapy group.    Participation Level:  Active  Participation Quality:  Appropriate and Attentive  Affect:  Appropriate  Cognitive:  Alert and Appropriate  Insight: Appropriate  Engagement in Group:  Engaged  Modes of Intervention:  Activity  Additional Comments:    Rhylen Shaheen 04/10/2023, 4:24 PM

## 2023-04-10 NOTE — Group Note (Signed)
Date:  04/10/2023 Time:  8:46 PM  Group Topic/Focus:  Goals Group:   The focus of this group is to help patients establish daily goals to achieve during treatment and discuss how the patient can incorporate goal setting into their daily lives to aide in recovery.    Participation Level:  Active  Participation Quality:  Appropriate, Attentive, Sharing, and Supportive  Affect:  Appropriate  Cognitive:  Appropriate  Insight: Appropriate and Good  Engagement in Group:  Engaged and Supportive  Modes of Intervention:  Discussion and Support  Additional Comments:     Belva Crome 04/10/2023, 8:46 PM

## 2023-04-10 NOTE — Progress Notes (Signed)
Assumed care of patient this pm, he presented A&O x 4, denied pain, SI, plan ofr intent 0r depression but, "a bit of anxiety". Pt interacted with peers, attended group and was compliant with taking his medications. Pt glucose level remain elevated, 457 mg/dl, he was asymptotic, NP on call contacted, and instructed to follow order, get labs drawn and contact MD for further instructions if necessary. Will continue to monitor glucose, assess for safety and follor plan of care.

## 2023-04-11 DIAGNOSIS — F332 Major depressive disorder, recurrent severe without psychotic features: Secondary | ICD-10-CM | POA: Diagnosis not present

## 2023-04-11 LAB — GLUCOSE, CAPILLARY
Glucose-Capillary: 204 mg/dL — ABNORMAL HIGH (ref 70–99)
Glucose-Capillary: 349 mg/dL — ABNORMAL HIGH (ref 70–99)
Glucose-Capillary: 354 mg/dL — ABNORMAL HIGH (ref 70–99)
Glucose-Capillary: 344 mg/dL — ABNORMAL HIGH (ref 70–99)

## 2023-04-11 LAB — LEVETIRACETAM LEVEL: Levetiracetam Lvl: 8.6 ug/mL — ABNORMAL LOW (ref 10.0–40.0)

## 2023-04-11 MED ORDER — INSULIN ASPART 100 UNIT/ML IJ SOLN
0.0000 [IU] | Freq: Three times a day (TID) | INTRAMUSCULAR | Status: DC
  Administered 2023-04-12: 15 [IU] via SUBCUTANEOUS
  Administered 2023-04-12: 5 [IU] via SUBCUTANEOUS
  Administered 2023-04-12: 2 [IU] via SUBCUTANEOUS
  Administered 2023-04-13: 11 [IU] via SUBCUTANEOUS
  Administered 2023-04-13: 2 [IU] via SUBCUTANEOUS
  Filled 2023-04-11 (×5): qty 1

## 2023-04-11 MED ORDER — INSULIN ASPART 100 UNIT/ML IJ SOLN
0.0000 [IU] | Freq: Every day | INTRAMUSCULAR | Status: DC
  Administered 2023-04-11: 4 [IU] via SUBCUTANEOUS
  Administered 2023-04-12: 5 [IU] via SUBCUTANEOUS
  Filled 2023-04-11 (×2): qty 1

## 2023-04-11 NOTE — Inpatient Diabetes Management (Signed)
Inpatient Diabetes Program Recommendations  AACE/ADA: New Consensus Statement on Inpatient Glycemic Control   Target Ranges:  Prepandial:   less than 140 mg/dL      Peak postprandial:   less than 180 mg/dL (1-2 hours)      Critically ill patients:  140 - 180 mg/dL    Latest Reference Range & Units 04/10/23 07:53 04/10/23 11:43 04/10/23 16:39 04/10/23 21:37 04/11/23 06:28  Glucose-Capillary 70 - 99 mg/dL 161 (H) 096 (H) 045 (H) 457 (H) 204 (H)   Review of Glycemic Control  Diabetes history: DM1 (does NOT make any insulin; requires basal, correction, and carb coverage insulin) Outpatient Diabetes medications: Lantus 55 units QHS, Humalog for meal coverage and correction (1 unit for 8 grams of carbs, 1 unit drops glucose 50 mg/dl Current orders for Inpatient glycemic control: Semglee 36 units at bedtime, Novolog 0-9 units TID with meals, Novolog 0-5 units at bedtime, Novolog 6 units TID with meals   Inpatient Recommendation:   Insulin: Please consider increasing Semglee to 38 units at bedtime and meal coverage to Novolog 9 units TID with meals.   Supplements: Noted patient is ordered Glucerna TID between meals. May want to re-evaluate if supplements are needed or not as they are contributing to hyperglycemia (given that patient has DM1 and does not make any insulin to cover carbs from supplement).   NOTE: Patient has hx of Type 1 DM and is well known to inpatient diabetes team due to frequency ED visits and hospitalizations. Patient sees [facility] for DM management and last seen Dr. Collene Schlichter on 01/25/23. Patient admitted to inpatient BH with major depressive disorder under IVC. Per consult note on 04/06/23 by Nanine Means, NP, patient's mother reported that she provider patient with all DM medications and supplies so there is no reason he is note taking his insulin consistently.   Thanks, Orlando Penner, RN, MSN, CDCES Diabetes Coordinator Inpatient Diabetes Program [phone]  (Team Pager from 8am to 5pm)

## 2023-04-11 NOTE — Group Note (Signed)
Date:  04/11/2023 Time:  4:14 PM  Group Topic/Focus:  Outdoor Recreation/Activity    Participation Level:  Did Not Attend   Lynelle Smoke [facility] 04/11/2023, 4:14 PM

## 2023-04-11 NOTE — Group Note (Signed)
Date:  04/11/2023 Time:  9:53 AM  Group Topic/Focus:  Goals Group:   The focus of this group is to help patients establish daily goals to achieve during treatment and discuss how the patient can incorporate goal setting into their daily lives to aide in recovery.    Participation Level:  Did Not Attend   Lynelle Smoke [facility] 04/11/2023, 9:53 AM

## 2023-04-11 NOTE — Group Note (Signed)
Recreation Therapy Group Note   Group Topic:Relaxation  Group Date: 04/11/2023 Start Time: 1000 End Time: 1050 Facilitators: Rosina Lowenstein, LRT, CTRS Location:  Craft Room  Group Description: PMR (Progressive Muscle Relaxation). LRT asks patients their current level of stress/anxiety from 1-10, with 10 being the highest. LRT educates patients on what PMR is and the benefits that come from it. Patients are asked to sit with their feet flat on the floor while sitting up and all the way back in their chair, if possible. LRT and pts follow a prompt through a speaker that requires you to tense and release different muscles in their body and focus on their breathing. During session, lights are off and soft music is being played. Pts are given a stress ball to use if needed. At the end of the prompt, LRT asks patients to rank their current levels of stress/anxiety from 1-10, 10 being the highest. LRT provided pts with an information handout on PMR.   Goal Area(s) Addressed:  Patients will be able to describe progressive muscle relaxation.  Patient will practice using relaxation technique. Patient will identify a new coping skill.  Patient will follow multistep directions to reduce anxiety and stress.   Affect/Mood: Appropriate   Participation Level: Active and Engaged   Participation Quality: Independent   Behavior: Appropriate, Calm, and Cooperative   Speech/Thought Process: Coherent   Insight: Good   Judgement: Good   Modes of Intervention: Activity   Patient Response to Interventions:  Attentive, Engaged, Interested , and Receptive   Education Outcome:  Acknowledges education   Clinical Observations/Individualized Feedback: Webster was active in their participation of session activities and group discussion. Pt identified that his stress and anxiety were a 2 before and after the session. Pt completed all exercises appropriately while interacting well with LRT and peers duration  of session.    Plan: Continue to engage patient in RT group sessions 2-3x/week.   Rosina Lowenstein, LRT, CTRS 04/11/2023 11:20 AM

## 2023-04-11 NOTE — Progress Notes (Signed)
[facility] MD Progress Note  04/11/2023 1:07 PM Jesus Ewing  MRN:  [id number] Subjective: Jesus Ewing is seen on rounds.  He denies any side effects from his medication.  He is on Lexapro 10 mg/day.  He says that he is doing well and we talked about discharge on Friday.  Social work is still working on follow-up for him.  Nurses report no issues.  He has been involved in groups and individual therapy.  He is doing well. Principal Problem: MDD (major depressive disorder), recurrent episode, severe (HCC) Diagnosis: Principal Problem:   MDD (major depressive disorder), recurrent episode, severe (HCC)  Total Time spent with patient: 15 minutes  Past Psychiatric History: Unremarkable  Past Medical History:  Past Medical History:  Diagnosis Date   Celiac disease    Diabetes mellitus without complication (HCC)     Past Surgical History:  Procedure Laterality Date   AMPUTATION TOE Right 11/10/2021   Procedure: AMPUTATION TOE;  Surgeon: Candelaria Stagers, DPM;  Location: [facility] ORS;  Service: Podiatry;  Laterality: Right;   Family History:  Family History  Problem Relation Age of Onset   Healthy Mother    Diabetes Neg Hx    Family Psychiatric  History: Unremarkable Social History:  Social History   Substance and Sexual Activity  Alcohol Use Not Currently     Social History   Substance and Sexual Activity  Drug Use Not Currently   Types: Cocaine    Social History   Socioeconomic History   Marital status: Single    Spouse name: Not on file   Number of children: Not on file   Years of education: Not on file   Highest education level: Not on file  Occupational History   Not on file  Tobacco Use   Smoking status: Every Day    Types: Cigarettes, E-cigarettes   Smokeless tobacco: Never   Tobacco comments:    Vape daily, don't smoke anymore.  Vaping Use   Vaping status: Every Day  Substance and Sexual Activity   Alcohol use: Not Currently   Drug use: Not Currently    Types:  Cocaine   Sexual activity: Not on file  Other Topics Concern   Not on file  Social History Narrative   Not on file   Social Determinants of Health   Financial Resource Strain: Low Risk  (01/25/2023)   Received from [facility]   Overall Financial Resource Strain (CARDIA)    Difficulty of Paying Living Expenses: Not hard at all  Food Insecurity: No Food Insecurity (04/07/2023)   Hunger Vital Sign    Worried About Running Out of Food in the Last Year: Never true    Ran Out of Food in the Last Year: Never true  Transportation Needs: No Transportation Needs (04/07/2023)   PRAPARE - Administrator, Civil Service (Medical): No    Lack of Transportation (Non-Medical): No  Recent Concern: Transportation Needs - Unmet Transportation Needs (01/25/2023)   Received from [facility] - Transportation    In the past 12 months, has lack of transportation kept you from medical appointments or from getting medications?: Yes    Lack of Transportation (Non-Medical): Yes  Physical Activity: Inactive (08/12/2020)   Received from [facility], [facility]   Exercise Vital Sign    Days of Exercise per Week: 0 days    Minutes of Exercise per Session: 0 min  Stress: Not on file  Social Connections: Not on file   Additional Social History:                         Sleep: Good  Appetite:  Good  Current Medications: Current Facility-Administered Medications  Medication Dose Route Frequency Provider Last Rate Last Admin   acetaminophen (TYLENOL) tablet 650 mg  650 mg Oral Q6H PRN Dixon, Rashaun M, NP       alum & mag hydroxide-simeth (MAALOX/MYLANTA) 200-200-20 MG/5ML suspension 30 mL  30 mL Oral Q4H PRN Dixon, Rashaun M, NP       diphenhydrAMINE (BENADRYL) capsule 50 mg  50 mg Oral TID PRN Jearld Lesch, NP       Or   diphenhydrAMINE (BENADRYL) injection 50 mg  50 mg Intramuscular TID PRN Jearld Lesch, NP       escitalopram (LEXAPRO) tablet 10 mg  10 mg Oral Daily Durwin Nora, Rashaun M, NP   10 mg at 04/11/23 0831   feeding supplement (GLUCERNA SHAKE) (GLUCERNA SHAKE) liquid 237 mL  237 mL Oral TID BM Myriam Forehand, NP   237 mL at 04/11/23 0835   gabapentin (NEURONTIN) capsule 600 mg  600 mg Oral BID Lerry Liner M, NP   600 mg at 04/11/23 0830   Followed by   Melene Muller ON 04/27/2023] gabapentin (NEURONTIN) capsule 600 mg  600 mg Oral TID Jearld Lesch, NP       haloperidol (HALDOL) tablet 5 mg  5 mg Oral TID PRN Jearld Lesch, NP       Or   haloperidol lactate (HALDOL) injection 5 mg  5 mg Intramuscular TID PRN Jearld Lesch, NP       hydrOXYzine (ATARAX) tablet 25 mg  25 mg Oral TID PRN Jearld Lesch, NP       insulin aspart (novoLOG) injection 0-5 Units  0-5 Units Subcutaneous QHS Jearld Lesch, NP   5 Units at 04/10/23 2155   insulin aspart (novoLOG) injection 0-9 Units  0-9 Units Subcutaneous TID WC Dixon, Rashaun M, NP   7 Units at 04/11/23 1240   insulin aspart (novoLOG) injection 6 Units  6 Units Subcutaneous TID WC Jearld Lesch, NP   6 Units at 04/11/23 1239   insulin glargine-yfgn (SEMGLEE) injection 36 Units  36 Units Subcutaneous QHS Jearld Lesch, NP   36 Units at 04/10/23 2154   levETIRAcetam (KEPPRA) tablet 500 mg  500 mg Oral BID Lerry Liner M, NP   500 mg at 04/11/23 9562   LORazepam (ATIVAN) tablet 2 mg  2 mg Oral TID PRN Jearld Lesch, NP       Or   LORazepam (ATIVAN) injection 2 mg  2 mg Intramuscular TID PRN Jearld Lesch, NP       magnesium hydroxide (MILK OF MAGNESIA) suspension 30 mL  30 mL Oral Daily PRN Jearld Lesch, NP       multivitamin with minerals tablet 1 tablet  1 tablet Oral Daily Jearld Lesch, NP   1 tablet at 04/11/23 1308   nicotine (NICODERM CQ - dosed in mg/24 hr) patch 7 mg  7 mg Transdermal Daily Lerry Liner M, NP       traZODone (DESYREL) tablet 100 mg  100 mg Oral QHS Sarina Ill, DO   100 mg  at 04/10/23 2154    Lab Results:  Results for orders placed or performed during the hospital encounter of 04/07/23 (from  the past 48 hour(s))  Glucose, capillary     Status: Abnormal   Collection Time: 04/09/23  4:49 PM  Result Value Ref Range   Glucose-Capillary 260 (H) 70 - 99 mg/dL    Comment: Glucose reference range applies only to samples taken after fasting for at least 8 hours.  Glucose, capillary     Status: Abnormal   Collection Time: 04/09/23  8:39 PM  Result Value Ref Range   Glucose-Capillary 118 (H) 70 - 99 mg/dL    Comment: Glucose reference range applies only to samples taken after fasting for at least 8 hours.  Glucose, capillary     Status: Abnormal   Collection Time: 04/10/23  6:17 AM  Result Value Ref Range   Glucose-Capillary 366 (H) 70 - 99 mg/dL    Comment: Glucose reference range applies only to samples taken after fasting for at least 8 hours.  Glucose, capillary     Status: Abnormal   Collection Time: 04/10/23  7:53 AM  Result Value Ref Range   Glucose-Capillary 322 (H) 70 - 99 mg/dL    Comment: Glucose reference range applies only to samples taken after fasting for at least 8 hours.  Glucose, capillary     Status: Abnormal   Collection Time: 04/10/23 11:43 AM  Result Value Ref Range   Glucose-Capillary 151 (H) 70 - 99 mg/dL    Comment: Glucose reference range applies only to samples taken after fasting for at least 8 hours.  Comprehensive metabolic panel     Status: Abnormal   Collection Time: 04/10/23 12:56 PM  Result Value Ref Range   Sodium 134 (L) 135 - 145 mmol/L   Potassium 4.3 3.5 - 5.1 mmol/L   Chloride 95 (L) 98 - 111 mmol/L   CO2 31 22 - 32 mmol/L   Glucose, Bld 260 (H) 70 - 99 mg/dL    Comment: Glucose reference range applies only to samples taken after fasting for at least 8 hours.   BUN 27 (H) 6 - 20 mg/dL   Creatinine, Ser 1.61 0.61 - 1.24 mg/dL   Calcium 8.7 (L) 8.9 - 10.3 mg/dL   Total Protein 6.0 (L) 6.5 - 8.1 g/dL   Albumin 3.2  (L) 3.5 - 5.0 g/dL   AST 23 15 - 41 U/L   ALT 40 0 - 44 U/L   Alkaline Phosphatase 70 38 - 126 U/L   Total Bilirubin 0.6 0.3 - 1.2 mg/dL   GFR, Estimated >09 >60 mL/min    Comment: (NOTE) Calculated using the CKD-EPI Creatinine Equation (2021)    Anion gap 8 5 - 15    Comment: Performed at [facility], [address] [place], [place] [address]  Glucose, capillary     Status: Abnormal   Collection Time: 04/10/23  4:39 PM  Result Value Ref Range   Glucose-Capillary 368 (H) 70 - 99 mg/dL    Comment: Glucose reference range applies only to samples taken after fasting for at least 8 hours.  Glucose, capillary     Status: Abnormal   Collection Time: 04/10/23  9:37 PM  Result Value Ref Range   Glucose-Capillary 457 (H) 70 - 99 mg/dL    Comment: Glucose reference range applies only to samples taken after fasting for at least 8 hours.  Glucose, capillary     Status: Abnormal   Collection Time: 04/11/23  6:28 AM  Result Value Ref Range   Glucose-Capillary 204 (H) 70 - 99 mg/dL    Comment: Glucose reference  range applies only to samples taken after fasting for at least 8 hours.  Glucose, capillary     Status: Abnormal   Collection Time: 04/11/23 11:41 AM  Result Value Ref Range   Glucose-Capillary 349 (H) 70 - 99 mg/dL    Comment: Glucose reference range applies only to samples taken after fasting for at least 8 hours.    Blood Alcohol level:  Lab Results  Component Value Date   ETH <10 04/06/2023   ETH <10 02/21/2022    Metabolic Disorder Labs: Lab Results  Component Value Date   HGBA1C 12.0 (H) 04/05/2023   MPG 297.7 04/05/2023   MPG 246.04 02/21/2022   No results found for: "PROLACTIN" No results found for: "CHOL", "TRIG", "HDL", "CHOLHDL", "VLDL", "LDLCALC"  Physical Findings: AIMS:  , ,  ,  ,    CIWA:    COWS:     Musculoskeletal: Strength & Muscle Tone: within normal limits Gait & Station: normal Patient leans: N/A  Psychiatric Specialty  Exam:  Presentation  General Appearance:  Casual  Eye Contact: Good  Speech: Clear and Coherent  Speech Volume: Normal  Handedness: Right   Mood and Affect  Mood:No data recorded Affect:No data recorded  Thought Process  Thought Processes: Coherent  Descriptions of Associations:Intact  Orientation:Full (Time, Place and Person)  Thought Content:WDL  History of Schizophrenia/Schizoaffective disorder:No  Duration of Psychotic Symptoms:No data recorded Hallucinations:No data recorded Ideas of Reference:None  Suicidal Thoughts:No data recorded Homicidal Thoughts:No data recorded  Sensorium  Memory: Immediate Fair  Judgment: Fair  Insight: Fair   Art therapist  Concentration: Fair  Attention Span: Fair  Recall: Fair  Fund of Knowledge: Good  Language: Good   Psychomotor Activity  Psychomotor Activity:No data recorded  Assets  Assets: Housing; Social Support   Sleep  Sleep:No data recorded    Blood pressure 99/69, pulse 78, temperature (!) 97 F (36.1 C), resp. rate 18, height 5\' 11"  (1.803 m), weight 63.5 kg, SpO2 98%. Body mass index is 19.53 kg/m.   Treatment Plan Summary: Daily contact with patient to assess and evaluate symptoms and progress in treatment, Medication management, and Plan continue current medications.  Sarina Ill, DO 04/11/2023, 1:07 PM

## 2023-04-11 NOTE — Plan of Care (Signed)
Problem: Education: Goal: Knowledge of [facility] Education information/materials will improve Outcome: Progressing Goal: Emotional status will improve Outcome: Progressing Goal: Mental status will improve Outcome: Progressing Goal: Verbalization of understanding the information provided will improve Outcome: Progressing   Problem: Activity: Goal: Interest or engagement in activities will improve Outcome: Progressing Goal: Sleeping patterns will improve Outcome: Progressing   Problem: Coping: Goal: Ability to verbalize frustrations and anger appropriately will improve Outcome: Progressing Goal: Ability to demonstrate self-control will improve Outcome: Progressing   Problem: Health Behavior/Discharge Planning: Goal: Identification of resources available to assist in meeting health care needs will improve Outcome: Progressing Goal: Compliance with treatment plan for underlying cause of condition will improve Outcome: Progressing   Problem: Physical Regulation: Goal: Ability to maintain clinical measurements within normal limits will improve Outcome: Progressing   Problem: Safety: Goal: Periods of time without injury will increase Outcome: Progressing   Problem: Education: Goal: Knowledge of General Education information will improve Description: Including pain rating scale, medication(s)/side effects and non-pharmacologic comfort measures Outcome: Progressing   Problem: Health Behavior/Discharge Planning: Goal: Ability to manage health-related needs will improve Outcome: Progressing   Problem: Clinical Measurements: Goal: Ability to maintain clinical measurements within normal limits will improve Outcome: Progressing Goal: Will remain free from infection Outcome: Progressing Goal: Diagnostic test results will improve Outcome: Progressing Goal: Respiratory complications will improve Outcome: Progressing Goal: Cardiovascular complication will be  avoided Outcome: Progressing   Problem: Activity: Goal: Risk for activity intolerance will decrease Outcome: Progressing   Problem: Nutrition: Goal: Adequate nutrition will be maintained Outcome: Progressing   Problem: Coping: Goal: Level of anxiety will decrease Outcome: Progressing   Problem: Elimination: Goal: Will not experience complications related to bowel motility Outcome: Progressing Goal: Will not experience complications related to urinary retention Outcome: Progressing   Problem: Pain Managment: Goal: General experience of comfort will improve Outcome: Progressing   Problem: Safety: Goal: Ability to remain free from injury will improve Outcome: Progressing   Problem: Skin Integrity: Goal: Risk for impaired skin integrity will decrease Outcome: Progressing   Problem: Education: Goal: Ability to describe self-care measures that may prevent or decrease complications (Diabetes Survival Skills Education) will improve Outcome: Progressing Goal: Individualized Educational Video(s) Outcome: Progressing   Problem: Cardiac: Goal: Ability to maintain an adequate cardiac output will improve Outcome: Progressing   Problem: Health Behavior/Discharge Planning: Goal: Ability to identify and utilize available resources and services will improve Outcome: Progressing Goal: Ability to manage health-related needs will improve Outcome: Progressing   Problem: Fluid Volume: Goal: Ability to achieve a balanced intake and output will improve Outcome: Progressing   Problem: Metabolic: Goal: Ability to maintain appropriate glucose levels will improve Outcome: Progressing   Problem: Nutritional: Goal: Maintenance of adequate nutrition will improve Outcome: Progressing Goal: Maintenance of adequate weight for body size and type will improve Outcome: Progressing   Problem: Respiratory: Goal: Will regain and/or maintain adequate ventilation Outcome: Progressing    Problem: Urinary Elimination: Goal: Ability to achieve and maintain adequate renal perfusion and functioning will improve Outcome: Progressing   Problem: Education: Goal: Ability to make informed decisions regarding treatment will improve Outcome: Progressing   Problem: Coping: Goal: Coping ability will improve Outcome: Progressing   Problem: Health Behavior/Discharge Planning: Goal: Identification of resources available to assist in meeting health care needs will improve Outcome: Progressing   Problem: Medication: Goal: Compliance with prescribed medication regimen will improve Outcome: Progressing   Problem: Self-Concept: Goal: Ability to disclose and discuss suicidal ideas will improve Outcome: Progressing  Goal: Will verbalize positive feelings about self Outcome: Progressing Note:

## 2023-04-11 NOTE — Progress Notes (Signed)
Patient pleasant and cooperative. Denies SI, HI, AVh. Medication compliant. Appropriate with staff and peers. Walking around unit with peer animated. Laughing, smiling and joking. Voiced no concerns or complaints. CBG 344 short and long acting given.  Encouragement and support provided. Safety checks maintained. Medications given as prescribed. Prns given for sleep. Patient remains safe on unit with q 15 min checks.

## 2023-04-11 NOTE — Progress Notes (Signed)
D- Patient alert and oriented x 4. Affect bright/mood euthymic. Denies SI/ HI/ AVH. Patient denies pain. Patient denies depression and anxiety. CBG's continue elevated requiring sliding scale insulin with every meal. MD notified and order for Diabetic Consult placed. No signs or symptoms of hyper glycemia A- Scheduled medications administered to patient, per MD orders. Support and encouragement provided.  Routine safety checks conducted every 15 minutes without incident.  Patient informed to notify staff with problems or concerns and verbalizes understanding. R- No adverse drug reactions noted.  Patient compliant with medications and treatment plan. Patient receptive, calm cooperative and interacts well with others on the unit.  Patient contracts for safety and  remains safe on the unit at this time.

## 2023-04-11 NOTE — Plan of Care (Signed)
  Problem: Education: Goal: Emotional status will improve Outcome: Progressing Goal: Mental status will improve Outcome: Progressing Goal: Verbalization of understanding the information provided will improve Outcome: Progressing   Problem: Education: Goal: Emotional status will improve Outcome: Progressing   Problem: Activity: Goal: Interest or engagement in activities will improve Outcome: Progressing Goal: Sleeping patterns will improve Outcome: Progressing   Problem: Coping: Goal: Ability to verbalize frustrations and anger appropriately will improve Outcome: Progressing Goal: Ability to demonstrate self-control will improve Outcome: Progressing   Problem: Health Behavior/Discharge Planning: Goal: Identification of resources available to assist in meeting health care needs will improve Outcome: Progressing Goal: Compliance with treatment plan for underlying cause of condition will improve Outcome: Progressing

## 2023-04-12 DIAGNOSIS — F332 Major depressive disorder, recurrent severe without psychotic features: Secondary | ICD-10-CM | POA: Diagnosis not present

## 2023-04-12 LAB — GLUCOSE, CAPILLARY
Glucose-Capillary: 138 mg/dL — ABNORMAL HIGH (ref 70–99)
Glucose-Capillary: 475 mg/dL — ABNORMAL HIGH (ref 70–99)
Glucose-Capillary: 325 mg/dL — ABNORMAL HIGH (ref 70–99)
Glucose-Capillary: 517 mg/dL (ref 70–99)

## 2023-04-12 MED ORDER — INSULIN GLARGINE-YFGN 100 UNIT/ML ~~LOC~~ SOLN
50.0000 [IU] | Freq: Every day | SUBCUTANEOUS | Status: DC
  Administered 2023-04-12: 50 [IU] via SUBCUTANEOUS
  Filled 2023-04-12 (×2): qty 0.5

## 2023-04-12 MED ORDER — INSULIN GLARGINE-YFGN 100 UNIT/ML ~~LOC~~ SOLN
44.0000 [IU] | Freq: Every day | SUBCUTANEOUS | Status: DC
  Filled 2023-04-12: qty 0.44

## 2023-04-12 NOTE — Progress Notes (Signed)
Pts  blood sugar level was 517, he received 5 units of sliding scale and 50 units of his long acting insulin. Provider Lerry Liner) notified. Pt is asymptomatic at this time , will continue to monitor.

## 2023-04-12 NOTE — BHH Counselor (Signed)
CSW spoke with the patient's grandfather. Grandfather reports that the pt weapon has been removed by a friend.  He reports no concerns for the patient being discharged on 04/13/2023.  He does reports worry that patient may have lost his keys during admission.  Penni Homans, MSW, LCSW 04/12/2023 4:11 PM

## 2023-04-12 NOTE — Group Note (Signed)
[facility] LCSW Group Therapy Note   Group Date: 04/12/2023 Start Time: 1030 End Time: 1130   Type of Therapy/Topic:  Group Therapy:  Emotion Regulation  Participation Level:  Active   Mood:  Description of Group:    The purpose of this group is to assist patients in learning to regulate negative emotions and experience positive emotions. Patients will be guided to discuss ways in which they have been vulnerable to their negative emotions. These vulnerabilities will be juxtaposed with experiences of positive emotions or situations, and patients challenged to use positive emotions to combat negative ones. Special emphasis will be placed on coping with negative emotions in conflict situations, and patients will process healthy conflict resolution skills.  Therapeutic Goals: Patient will identify two positive emotions or experiences to reflect on in order to balance out negative emotions:  Patient will label two or more emotions that they find the most difficult to experience:  Patient will be able to demonstrate positive conflict resolution skills through discussion or role plays:   Summary of Patient Progress:  Patient was present in group. Patient was an active participant in group.  Patient was engaged and supportive of others.  Patient was appropriate.  Patient displayed fair insight. Patient did require redirection, however, responded well.   Therapeutic Modalities:   Cognitive Behavioral Therapy Feelings Identification Dialectical Behavioral Therapy   Harden Mo, LCSW

## 2023-04-12 NOTE — Group Note (Signed)
Date:  04/12/2023 Time:  5:17 PM  Group Topic/Focus:  Self Care:   The focus of this group is to help patients understand the importance of self-care in order to improve or restore emotional, physical, spiritual, interpersonal, and financial health.    Participation Level:  Active  Participation Quality:  Appropriate  Affect:  Appropriate  Cognitive:  Appropriate  Insight: Appropriate  Engagement in Group:  Engaged  Modes of Intervention:  Activity  Additional Comments:    Jesus Ewing 04/12/2023, 5:17 PM

## 2023-04-12 NOTE — Progress Notes (Signed)
   04/12/23 1000  Psych Admission Type (Psych Patients Only)  Admission Status Involuntary  Psychosocial Assessment  Patient Complaints None  Eye Contact Brief  Facial Expression Flat  Affect Appropriate to circumstance;Flat  Speech Slow  Interaction Assertive  Motor Activity Slow  Appearance/Hygiene Unremarkable  Behavior Characteristics Calm;Appropriate to situation  Mood Pleasant  Thought Process  Coherency WDL  Content WDL  Delusions None reported or observed  Perception WDL  Hallucination None reported or observed  Judgment Poor  Confusion None  Danger to Self  Current suicidal ideation? Denies  Agreement Not to Harm Self Yes  Danger to Others  Danger to Others None reported or observed  Danger to Others Abnormal  Harmful Behavior to others No threats or harm toward other people  Destructive Behavior No threats or harm toward property   No hyper/hypoglycemic symptoms noted. Covered all blood sugars, Notified provider of elevated blood sugar. Insulin dosage adjusted for night administration also diabetic consult order was ordered. Continues to tolerated all medications and meals.

## 2023-04-12 NOTE — Inpatient Diabetes Management (Signed)
Inpatient Diabetes Program Recommendations  AACE/ADA: New Consensus Statement on Inpatient Glycemic Control   Target Ranges:  Prepandial:   less than 140 mg/dL      Peak postprandial:   less than 180 mg/dL (1-2 hours)      Critically ill patients:  140 - 180 mg/dL    Latest Reference Range & Units 04/10/23 07:53 04/10/23 11:43 04/10/23 16:39 04/10/23 21:37 04/11/23 06:28  Glucose-Capillary 70 - 99 mg/dL 846 (H) 962 (H) 952 (H) 457 (H) 204 (H)   Review of Glycemic Control  Latest Reference Range & Units 04/11/23 06:28 04/11/23 11:41 04/11/23 16:32 04/11/23 20:33 04/12/23 07:07  Glucose-Capillary 70 - 99 mg/dL 841 (H)  Novolog 9 units given 349 (H)  Novolog 13 units 354 (H)  Novolog 15 units 344 (H)  Novolog 4 units 138 (H)  Novolog 2 units   Diabetes history: DM1 (does NOT make any insulin; requires basal, correction, and carb coverage insulin) Outpatient Diabetes medications: Lantus 55 units QHS, Humalog for meal coverage and correction (1 unit for 8 grams of carbs, 1 unit drops glucose 50 mg/dl Current orders for Inpatient glycemic control:  Semglee 36 units at bedtime Novolog 0-9 units TID + HS scale   Glucerna tid between meals (20 grams of carbohydrate)  Inpatient Recommendation:   Insulin: Add back Novolog 9 units tid meal coverage. Pt has type 1 diabetes and requires insulin to cover carbohydrate consumption   Supplements: Noted patient is ordered Glucerna TID between meals. May want to re-evaluate if supplements are needed or not as they are contributing to hyperglycemia     Thanks, Christena Deem RN, MSN, BC-ADM Inpatient Diabetes Coordinator Team Pager [phone] (8a-5p)

## 2023-04-12 NOTE — Progress Notes (Signed)
D- Pt is alert and oriented . Pt denies experiencing any anxiety/depression,  Pt denies any pain as well. Pt also denies experiencing any SI/HI or AVH at this time.   A- Scheduled medications administered to pt per MD orders. Support and encouragement provided. Frequent verbal contact made. Pt is pleasant , cooperative and socializing with peers. Routine safety checks conducted q15 minutes.   R- No adverse drug reactions noted. Pt verbally contracts for safety at this time. Pt compliant with medications and treatment plan. Pt interacts well with others on the unit . Pt remains safe at this time . Plan of care ongoing.

## 2023-04-12 NOTE — Progress Notes (Signed)
[facility] MD Progress Note  04/12/2023 12:24 PM Jesus Ewing  MRN:  [id number] Subjective: Jesus Ewing is seen on rounds.  His blood sugars have been creeping up and he informs me that he is on 50 units of Lantus at nighttime which was recently increased by his [facility] endocrinologist.  He came in with DKA so since his blood sugars have been going up a minute changed to 50 units at bedtime.  I actually found the [facility] endocrinologist note and it is actually 55 units but will do 50.  I have consulted diabetic nurse also.  He has been in a good mood and pleasant and cooperative.  We talked about discharge tomorrow.  He is doing well on Lexapro.  No side effects.  Nurses report no other issues.  He has no complaints.  He denies any suicidal ideation.  Principal Problem: MDD (major depressive disorder), recurrent episode, severe (HCC) Diagnosis: Principal Problem:   MDD (major depressive disorder), recurrent episode, severe (HCC)  Total Time spent with patient: 15 minutes  Past Psychiatric History: Unremarkable  Past Medical History:  Past Medical History:  Diagnosis Date   Celiac disease    Diabetes mellitus without complication (HCC)     Past Surgical History:  Procedure Laterality Date   AMPUTATION TOE Right 11/10/2021   Procedure: AMPUTATION TOE;  Surgeon: Candelaria Stagers, DPM;  Location: [facility] ORS;  Service: Podiatry;  Laterality: Right;   Family History:  Family History  Problem Relation Age of Onset   Healthy Mother    Diabetes Neg Hx    Family Psychiatric  History: Unremarkable Social History:  Social History   Substance and Sexual Activity  Alcohol Use Not Currently     Social History   Substance and Sexual Activity  Drug Use Not Currently   Types: Cocaine    Social History   Socioeconomic History   Marital status: Single    Spouse name: Not on file   Number of children: Not on file   Years of education: Not on file   Highest education level: Not on file   Occupational History   Not on file  Tobacco Use   Smoking status: Every Day    Types: Cigarettes, E-cigarettes   Smokeless tobacco: Never   Tobacco comments:    Vape daily, don't smoke anymore.  Vaping Use   Vaping status: Every Day  Substance and Sexual Activity   Alcohol use: Not Currently   Drug use: Not Currently    Types: Cocaine   Sexual activity: Not on file  Other Topics Concern   Not on file  Social History Narrative   Not on file   Social Determinants of Health   Financial Resource Strain: Low Risk  (01/25/2023)   Received from [facility]   Overall Financial Resource Strain (CARDIA)    Difficulty of Paying Living Expenses: Not hard at all  Food Insecurity: No Food Insecurity (04/07/2023)   Hunger Vital Sign    Worried About Running Out of Food in the Last Year: Never true    Ran Out of Food in the Last Year: Never true  Transportation Needs: No Transportation Needs (04/07/2023)   PRAPARE - Administrator, Civil Service (Medical): No    Lack of Transportation (Non-Medical): No  Recent Concern: Transportation Needs - Unmet Transportation Needs (01/25/2023)   Received from [facility] - Transportation    In the past 12 months, has lack of transportation kept  you from medical appointments or from getting medications?: Yes    Lack of Transportation (Non-Medical): Yes  Physical Activity: Inactive (08/12/2020)   [facility]   Exercise Vital Sign    Days of Exercise per Week: 0 days    Minutes of Exercise per Session: 0 min  Stress: Not on file  Social Connections: Not on file   Additional Social History:                         Sleep: Good  Appetite:  Good  Current Medications: Current Facility-Administered Medications  Medication Dose Route Frequency Provider Last Rate Last Admin   acetaminophen (TYLENOL) tablet 650 mg  650 mg  Oral Q6H PRN Jearld Lesch, NP       alum & mag hydroxide-simeth (MAALOX/MYLANTA) 200-200-20 MG/5ML suspension 30 mL  30 mL Oral Q4H PRN Dixon, Rashaun M, NP       diphenhydrAMINE (BENADRYL) capsule 50 mg  50 mg Oral TID PRN Jearld Lesch, NP       Or   diphenhydrAMINE (BENADRYL) injection 50 mg  50 mg Intramuscular TID PRN Jearld Lesch, NP       escitalopram (LEXAPRO) tablet 10 mg  10 mg Oral Daily Dixon, Rashaun M, NP   10 mg at 04/12/23 0839   feeding supplement (GLUCERNA SHAKE) (GLUCERNA SHAKE) liquid 237 mL  237 mL Oral TID BM Myriam Forehand, NP   237 mL at 04/11/23 2116   gabapentin (NEURONTIN) capsule 600 mg  600 mg Oral BID Lerry Liner M, NP   600 mg at 04/12/23 4098   Followed by   Melene Muller ON 04/27/2023] gabapentin (NEURONTIN) capsule 600 mg  600 mg Oral TID Jearld Lesch, NP       haloperidol (HALDOL) tablet 5 mg  5 mg Oral TID PRN Jearld Lesch, NP       Or   haloperidol lactate (HALDOL) injection 5 mg  5 mg Intramuscular TID PRN Jearld Lesch, NP       hydrOXYzine (ATARAX) tablet 25 mg  25 mg Oral TID PRN Jearld Lesch, NP   25 mg at 04/11/23 2116   insulin aspart (novoLOG) injection 0-15 Units  0-15 Units Subcutaneous TID WC Sarina Ill, DO   15 Units at 04/12/23 1123   insulin aspart (novoLOG) injection 0-5 Units  0-5 Units Subcutaneous QHS Sarina Ill, DO   4 Units at 04/11/23 2115   insulin glargine-yfgn (SEMGLEE) injection 50 Units  50 Units Subcutaneous QHS Sarina Ill, DO       levETIRAcetam (KEPPRA) tablet 500 mg  500 mg Oral BID Durwin Nora, Rashaun M, NP   500 mg at 04/12/23 0844   LORazepam (ATIVAN) tablet 2 mg  2 mg Oral TID PRN Jearld Lesch, NP       Or   LORazepam (ATIVAN) injection 2 mg  2 mg Intramuscular TID PRN Jearld Lesch, NP       magnesium hydroxide (MILK OF MAGNESIA) suspension 30 mL  30 mL Oral Daily PRN Jearld Lesch, NP       multivitamin with minerals tablet 1 tablet  1 tablet Oral Daily  Jearld Lesch, NP   1 tablet at 04/12/23 0839   nicotine (NICODERM CQ - dosed in mg/24 hr) patch 7 mg  7 mg Transdermal Daily Jearld Lesch, NP  traZODone (DESYREL) tablet 100 mg  100 mg Oral QHS Sarina Ill, DO   100 mg at 04/11/23 2116    Lab Results:  Results for orders placed or performed during the hospital encounter of 04/07/23 (from the past 48 hour(s))  Comprehensive metabolic panel     Status: Abnormal   Collection Time: 04/10/23 12:56 PM  Result Value Ref Range   Sodium 134 (L) 135 - 145 mmol/L   Potassium 4.3 3.5 - 5.1 mmol/L   Chloride 95 (L) 98 - 111 mmol/L   CO2 31 22 - 32 mmol/L   Glucose, Bld 260 (H) 70 - 99 mg/dL    Comment: Glucose reference range applies only to samples taken after fasting for at least 8 hours.   BUN 27 (H) 6 - 20 mg/dL   Creatinine, Ser 5.28 0.61 - 1.24 mg/dL   Calcium 8.7 (L) 8.9 - 10.3 mg/dL   Total Protein 6.0 (L) 6.5 - 8.1 g/dL   Albumin 3.2 (L) 3.5 - 5.0 g/dL   AST 23 15 - 41 U/L   ALT 40 0 - 44 U/L   Alkaline Phosphatase 70 38 - 126 U/L   Total Bilirubin 0.6 0.3 - 1.2 mg/dL   GFR, Estimated >41 >32 mL/min    Comment: (NOTE) Calculated using the CKD-EPI Creatinine Equation (2021)    Anion gap 8 5 - 15    Comment: Performed at [facility], [address] [place], [place] [address]  Levetiracetam level     Status: Abnormal   Collection Time: 04/10/23 12:56 PM  Result Value Ref Range   Levetiracetam Lvl 8.6 (L) 10.0 - 40.0 ug/mL    Comment: (NOTE) Performed At: [facility] [address] [place], [place] [id number] Jolene Schimke MD IH:[id number]   Glucose, capillary     Status: Abnormal   Collection Time: 04/10/23  4:39 PM  Result Value Ref Range   Glucose-Capillary 368 (H) 70 - 99 mg/dL    Comment: Glucose reference range applies only to samples taken after fasting for at least 8 hours.  Glucose, capillary     Status: Abnormal   Collection Time: 04/10/23  9:37 PM  Result Value Ref Range    Glucose-Capillary 457 (H) 70 - 99 mg/dL    Comment: Glucose reference range applies only to samples taken after fasting for at least 8 hours.  Glucose, capillary     Status: Abnormal   Collection Time: 04/11/23  6:28 AM  Result Value Ref Range   Glucose-Capillary 204 (H) 70 - 99 mg/dL    Comment: Glucose reference range applies only to samples taken after fasting for at least 8 hours.  Glucose, capillary     Status: Abnormal   Collection Time: 04/11/23 11:41 AM  Result Value Ref Range   Glucose-Capillary 349 (H) 70 - 99 mg/dL    Comment: Glucose reference range applies only to samples taken after fasting for at least 8 hours.  Glucose, capillary     Status: Abnormal   Collection Time: 04/11/23  4:32 PM  Result Value Ref Range   Glucose-Capillary 354 (H) 70 - 99 mg/dL    Comment: Glucose reference range applies only to samples taken after fasting for at least 8 hours.  Glucose, capillary     Status: Abnormal   Collection Time: 04/11/23  8:33 PM  Result Value Ref Range   Glucose-Capillary 344 (H) 70 - 99 mg/dL    Comment: Glucose reference range applies only to samples taken after fasting  for at least 8 hours.  Glucose, capillary     Status: Abnormal   Collection Time: 04/12/23  7:07 AM  Result Value Ref Range   Glucose-Capillary 138 (H) 70 - 99 mg/dL    Comment: Glucose reference range applies only to samples taken after fasting for at least 8 hours.  Glucose, capillary     Status: Abnormal   Collection Time: 04/12/23 11:21 AM  Result Value Ref Range   Glucose-Capillary 475 (H) 70 - 99 mg/dL    Comment: Glucose reference range applies only to samples taken after fasting for at least 8 hours.    Blood Alcohol level:  Lab Results  Component Value Date   ETH <10 04/06/2023   ETH <10 02/21/2022    Metabolic Disorder Labs: Lab Results  Component Value Date   HGBA1C 12.0 (H) 04/05/2023   MPG 297.7 04/05/2023   MPG 246.04 02/21/2022   No results found for: "PROLACTIN" No  results found for: "CHOL", "TRIG", "HDL", "CHOLHDL", "VLDL", "LDLCALC"  Physical Findings: AIMS:  , ,  ,  ,    CIWA:    COWS:     Musculoskeletal: Strength & Muscle Tone: within normal limits Gait & Station: normal Patient leans: N/A  Psychiatric Specialty Exam:  Presentation  General Appearance:  Casual  Eye Contact: Good  Speech: Clear and Coherent  Speech Volume: Normal  Handedness: Right   Mood and Affect  Mood:No data recorded Affect:No data recorded  Thought Process  Thought Processes: Coherent  Descriptions of Associations:Intact  Orientation:Full (Time, Place and Person)  Thought Content:WDL  History of Schizophrenia/Schizoaffective disorder:No  Duration of Psychotic Symptoms:No data recorded Hallucinations:No data recorded Ideas of Reference:None  Suicidal Thoughts:No data recorded Homicidal Thoughts:No data recorded  Sensorium  Memory: Immediate Fair  Judgment: Fair  Insight: Fair   Art therapist  Concentration: Fair  Attention Span: Fair  Recall: Fair  Fund of Knowledge: Good  Language: Good   Psychomotor Activity  Psychomotor Activity:No data recorded  Assets  Assets: Housing; Social Support   Sleep  Sleep:No data recorded    Blood pressure (!) 95/56, pulse 75, temperature 98.6 F (37 C), temperature source Oral, resp. rate 18, height 5\' 11"  (1.803 m), weight 63.5 kg, SpO2 98%. Body mass index is 19.53 kg/m.   Treatment Plan Summary: Daily contact with patient to assess and evaluate symptoms and progress in treatment, Medication management, and Plan increase long-acting insulin at nighttime to 50 units discharge tomorrow.  Sarina Ill, DO 04/12/2023, 12:24 PM

## 2023-04-12 NOTE — Group Note (Signed)
Date:  04/12/2023 Time:  8:59 PM  Group Topic/Focus:  Goals Group:   The focus of this group is to help patients establish daily goals to achieve during treatment and discuss how the patient can incorporate goal setting into their daily lives to aide in recovery.    Participation Level:  Active  Participation Quality:  Appropriate  Affect:  Appropriate  Cognitive:  Appropriate  Insight: Appropriate  Engagement in Group:  Engaged  Modes of Intervention:  Socialization  Additional Comments:    Osker Mason 04/12/2023, 8:59 PM

## 2023-04-12 NOTE — Group Note (Addendum)
Recreation Therapy Group Note   Group Topic:Healthy Support Systems  Group Date: 04/12/2023 Start Time: 1300 End Time: 1355 Facilitators: Rosina Lowenstein, LRT, CTRS Location:  Craft Room  Group Description: Straw Bridge. Individually, patients were given 10 plastic drinking straws and an equal length of masking tape. Using the materials provided, patients were instructed to build a free-standing bridge-like structure to suspend an everyday item (ex: puzzle box) off the floor or table surface. All materials were required to be used in Secondary school teacher. LRT facilitated post-activity discussion reviewing how we, humans, are like the structure we built; when things get too heavy in our life and we do not have adequate supports/coping skills, then we will fall just like the straw-built structure will. LRT focused on how having a "base" or structure on the bottom was necessary for the object to stand, meaning we must be secure and stable first before building on ourselves or others. Patients were encouraged to name 2 healthy supports in their life and reflect on how the skills used in this activity can be generalized to daily life post discharge.   Goal Area(s) Addressed:  Patient will identify two healthy support systems in their life. Patient will work on Product manager. Patient will verbalize the importance of having a strong and steady "base".  Patient will follow multi-step directions. Patients will engage in creativity and use all provided materials.   Affect/Mood: Appropriate   Participation Level: Active and Engaged   Participation Quality: Independent   Behavior: Appropriate, Calm, and Cooperative   Speech/Thought Process: Coherent   Insight: Good   Judgement: Good   Modes of Intervention: Activity   Patient Response to Interventions:  Attentive, Engaged, Interested , and Receptive   Education Outcome:  Acknowledges education   Clinical  Observations/Individualized Feedback: Jesus Ewing was active in their participation of session activities and group discussion. Pt identified "my grandfather and my dog, Doy Mince" as his healthy supports. Pt interacted well with LRT and peers duration of session.   Plan: Continue to engage patient in RT group sessions 2-3x/week.   [address], LRT, CTRS 04/12/2023 2:18 PM

## 2023-04-13 DIAGNOSIS — F332 Major depressive disorder, recurrent severe without psychotic features: Secondary | ICD-10-CM | POA: Diagnosis not present

## 2023-04-13 LAB — GLUCOSE, CAPILLARY
Glucose-Capillary: 148 mg/dL — ABNORMAL HIGH (ref 70–99)
Glucose-Capillary: 327 mg/dL — ABNORMAL HIGH (ref 70–99)

## 2023-04-13 MED ORDER — GABAPENTIN 300 MG PO CAPS
600.0000 mg | ORAL_CAPSULE | Freq: Three times a day (TID) | ORAL | Status: DC
  Administered 2023-04-13: 600 mg via ORAL
  Filled 2023-04-13: qty 2

## 2023-04-13 MED ORDER — GABAPENTIN 300 MG PO CAPS: 600.0000 mg | ORAL_CAPSULE | Freq: Three times a day (TID) | ORAL | 0 refills | Status: AC

## 2023-04-13 MED ORDER — TRAZODONE HCL 100 MG PO TABS: 100.0000 mg | ORAL_TABLET | Freq: Every day | ORAL | 0 refills | Status: AC

## 2023-04-13 MED ORDER — ESCITALOPRAM OXALATE 10 MG PO TABS: 10.0000 mg | ORAL_TABLET | Freq: Every day | ORAL | 0 refills | Status: AC

## 2023-04-13 MED ORDER — HYDROXYZINE HCL 25 MG PO TABS: 25.0000 mg | ORAL_TABLET | Freq: Three times a day (TID) | ORAL | 0 refills | Status: AC | PRN
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
## 2023-04-13 NOTE — BHH Suicide Risk Assessment (Addendum)
[facility] Discharge Suicide Risk Assessment   Principal Problem: MDD (major depressive disorder), recurrent episode, severe (HCC) Discharge Diagnoses: Principal Problem:   MDD (major depressive disorder), recurrent episode, severe (HCC)   Total Time spent with patient: 1 hour  Musculoskeletal: Strength & Muscle Tone: within normal limits Gait & Station: normal Patient leans: N/A  Psychiatric Specialty Exam: Physical Exam Vitals and nursing note reviewed.  Constitutional:      Appearance: Normal appearance.  HENT:     Head: Normocephalic.     Nose: Nose normal.  Pulmonary:     Effort: Pulmonary effort is normal.  Musculoskeletal:        General: Normal range of motion.     Cervical back: Normal range of motion.  Neurological:     General: No focal deficit present.     Mental Status: He is alert and oriented to person, place, and time.     Review of Systems  Psychiatric/Behavioral:  The patient is nervous/anxious.   All other systems reviewed and are negative.   Blood pressure 117/68, pulse 91, temperature 98.2 F (36.8 C), resp. rate 18, height 5\' 11"  (1.803 m), weight 63.5 kg, SpO2 98%.Body mass index is 19.53 kg/m.  General Appearance: Casual  Eye Contact:  Good  Speech:  Clear and Coherent  Volume:  Normal  Mood:  Anxious  Affect:  Congruent  Thought Process:  Coherent  Orientation:  Full (Time, Place, and Person)  Thought Content:  WDL and Logical  Suicidal Thoughts:  No  Homicidal Thoughts:  No  Memory:  Immediate;   Good Recent;   Good Remote;   Good  Judgement:  Fair  Insight:  Good  Psychomotor Activity:  Normal  Concentration:  Concentration: Good and Attention Span: Good  Recall:  Good  Fund of Knowledge:  Good  Language:  Good  Akathisia:  No  Handed:  Right  AIMS (if indicated):     Assets:  Housing Leisure Time Physical Health Resilience Social Support  ADL's:  Intact  Cognition:  WNL  Sleep:        Physical Exam: Physical Exam Vitals  and nursing note reviewed.  Constitutional:      Appearance: Normal appearance.  HENT:     Head: Normocephalic.     Nose: Nose normal.  Pulmonary:     Effort: Pulmonary effort is normal.  Musculoskeletal:        General: Normal range of motion.     Cervical back: Normal range of motion.  Neurological:     General: No focal deficit present.     Mental Status: He is alert and oriented to person, place, and time.    Review of Systems  Psychiatric/Behavioral:  The patient is nervous/anxious.   All other systems reviewed and are negative.  Blood pressure 117/68, pulse 91, temperature 98.2 F (36.8 C), resp. rate 18, height 5\' 11"  (1.803 m), weight 63.5 kg, SpO2 98%. Body mass index is 19.53 kg/m.  Mental Status Per Nursing Assessment::   On Admission:  NA  Demographic Factors:  Male, Adolescent or young adult, Caucasian, and Living alone  Loss Factors: NA  Historical Factors: NA  Risk Reduction Factors:   Sense of responsibility to family, Positive social support, and Positive coping skills or problem solving skills  Continued Clinical Symptoms:  Anxiety, mild  Cognitive Features That Contribute To Risk:  None    Suicide Risk:  Minimal: No identifiable suicidal ideation.  Patients presenting with no risk factors but with morbid ruminations;  may be classified as minimal risk based on the severity of the depressive symptoms   Plan Of Care/Follow-up recommendations:  Activity as tolerated Diet:  diabetic diet MDD: Lexapro 10 mg daily Follow up with [facility]  Anxiety: Hydroxyzine 25 mg TID PRN  Insomnia: Trazodone 100 mg daily at bedtime  Jesus Means, NP 04/13/2023, 10:38 AM

## 2023-04-13 NOTE — Group Note (Signed)
Date:  04/13/2023 Time:  10:42 AM  Group Topic/Focus:  Goals Group:   The focus of this group is to help patients establish daily goals to achieve during treatment and discuss how the patient can incorporate goal setting into their daily lives to aide in recovery.    Participation Level:  Active  Participation Quality:  Appropriate  Affect:  Appropriate  Cognitive:  Appropriate  Insight: Appropriate  Engagement in Group:  Engaged  Modes of Intervention:  Discussion, Education, and Support  Additional Comments:    Wilford Corner 04/13/2023, 10:42 AM

## 2023-04-13 NOTE — Group Note (Signed)
Recreation Therapy Group Note   Group Topic:Leisure Education  Group Date: 04/13/2023 Start Time: 1000 End Time: 1100 Facilitators: Rosina Lowenstein, LRT, CTRS Location:  Craft Room  Group Description: Leisure. Patients were given the option to choose from singing karaoke, coloring mandalas, using oil pastels, journaling, or playing with play-doh. LRT and pts discussed the meaning of leisure, the importance of participating in leisure during their free time/when they're outside of the hospital, as well as how our leisure interests can also serve as coping skills.    Goal Area(s) Addressed:  Patient will identify a current leisure interest.  Patient will learn the definition of "leisure". Patient will practice making a positive decision. Patient will have the opportunity to try a new leisure activity. Patient will communicate with peers and LRT.    Affect/Mood: Appropriate   Participation Level: Active and Engaged   Participation Quality: Independent   Behavior: Appropriate, Calm, and Cooperative   Speech/Thought Process: Coherent   Insight: Good   Judgement: Good   Modes of Intervention: Activity   Patient Response to Interventions:  Attentive, Engaged, Interested , and Receptive   Education Outcome:  Acknowledges education   Clinical Observations/Individualized Feedback: Jesus Ewing was active in their participation of session activities and group discussion. Pt identified "walk my dog and gaming" as things he does in his free time. Pt chose to color mandalas while in group. Pt interacted well with LRT and peers duration of session.   Plan: Continue to engage patient in RT group sessions 2-3x/week.   Rosina Lowenstein, LRT, CTRS 04/13/2023 11:36 AM

## 2023-04-13 NOTE — Progress Notes (Signed)
  [facility] Adult Case Management Discharge Plan :  Will you be returning to the same living situation after discharge:  Yes,  pt reports that he is returning home with his grandfather. At discharge, do you have transportation home?: Yes,  pt grandfather is providing transportation. Do you have the ability to pay for your medications: Yes,   [facility] [place] [facility]  Release of information consent forms completed and in the chart;  Patient's signature needed at discharge.  Patient to Follow up at:  Follow-up Information     [facility] Follow up.   Why: Appointment is scheduled for 04/18/23 at 8:30am  it will be a virtual appointment Contact information: [address]  [address] [place] [address] [phone]                 Next level of care provider has access to [facility] Link:no  Safety Planning and Suicide Prevention discussed: Yes,  SPE completed with patient and grandfather.      Has patient been referred to the Quitline?: Patient refused referral for treatment  Patient has been referred for addiction treatment: Patient refused referral for treatment; referral information given to patient at discharge.  Harden Mo, LCSW 04/13/2023, 11:33 AM

## 2023-04-13 NOTE — Plan of Care (Signed)
Problem: Education: Goal: Knowledge of [facility] Education information/materials will improve Outcome: Progressing Goal: Emotional status will improve Outcome: Progressing Goal: Mental status will improve Outcome: Progressing Goal: Verbalization of understanding the information provided will improve Outcome: Progressing   Problem: Activity: Goal: Interest or engagement in activities will improve Outcome: Progressing Goal: Sleeping patterns will improve Outcome: Progressing   Problem: Coping: Goal: Ability to verbalize frustrations and anger appropriately will improve Outcome: Progressing Goal: Ability to demonstrate self-control will improve Outcome: Progressing   Problem: Health Behavior/Discharge Planning: Goal: Identification of resources available to assist in meeting health care needs will improve Outcome: Progressing Goal: Compliance with treatment plan for underlying cause of condition will improve Outcome: Progressing   Problem: Physical Regulation: Goal: Ability to maintain clinical measurements within normal limits will improve Outcome: Progressing   Problem: Safety: Goal: Periods of time without injury will increase Outcome: Progressing   Problem: Education: Goal: Knowledge of General Education information will improve Description: Including pain rating scale, medication(s)/side effects and non-pharmacologic comfort measures Outcome: Progressing   Problem: Health Behavior/Discharge Planning: Goal: Ability to manage health-related needs will improve Outcome: Progressing   Problem: Clinical Measurements: Goal: Ability to maintain clinical measurements within normal limits will improve Outcome: Progressing Goal: Will remain free from infection Outcome: Progressing Goal: Diagnostic test results will improve Outcome: Progressing Goal: Respiratory complications will improve Outcome: Progressing Goal: Cardiovascular complication will be  avoided Outcome: Progressing   Problem: Activity: Goal: Risk for activity intolerance will decrease Outcome: Progressing   Problem: Nutrition: Goal: Adequate nutrition will be maintained Outcome: Progressing   Problem: Coping: Goal: Level of anxiety will decrease Outcome: Progressing   Problem: Elimination: Goal: Will not experience complications related to bowel motility Outcome: Progressing Goal: Will not experience complications related to urinary retention Outcome: Progressing   Problem: Pain Managment: Goal: General experience of comfort will improve Outcome: Progressing   Problem: Safety: Goal: Ability to remain free from injury will improve Outcome: Progressing   Problem: Skin Integrity: Goal: Risk for impaired skin integrity will decrease Outcome: Progressing   Problem: Education: Goal: Ability to describe self-care measures that may prevent or decrease complications (Diabetes Survival Skills Education) will improve Outcome: Progressing Goal: Individualized Educational Video(s) Outcome: Progressing   Problem: Cardiac: Goal: Ability to maintain an adequate cardiac output will improve Outcome: Progressing   Problem: Health Behavior/Discharge Planning: Goal: Ability to identify and utilize available resources and services will improve Outcome: Progressing Goal: Ability to manage health-related needs will improve Outcome: Progressing   Problem: Fluid Volume: Goal: Ability to achieve a balanced intake and output will improve Outcome: Progressing   Problem: Metabolic: Goal: Ability to maintain appropriate glucose levels will improve Outcome: Progressing   Problem: Nutritional: Goal: Maintenance of adequate nutrition will improve Outcome: Progressing Goal: Maintenance of adequate weight for body size and type will improve Outcome: Progressing   Problem: Respiratory: Goal: Will regain and/or maintain adequate ventilation Outcome: Progressing    Problem: Urinary Elimination: Goal: Ability to achieve and maintain adequate renal perfusion and functioning will improve Outcome: Progressing   Problem: Education: Goal: Ability to make informed decisions regarding treatment will improve Outcome: Progressing   Problem: Coping: Goal: Coping ability will improve Outcome: Progressing   Problem: Health Behavior/Discharge Planning: Goal: Identification of resources available to assist in meeting health care needs will improve Outcome: Progressing   Problem: Medication: Goal: Compliance with prescribed medication regimen will improve Outcome: Progressing   Problem: Self-Concept: Goal: Ability to disclose and discuss suicidal ideas will improve Outcome: Progressing  Problem: Education: Goal: Ability to describe self-care measures that may prevent or decrease complications (Diabetes Survival Skills Education) will improve Outcome: Progressing Goal: Individualized Educational Video(s) Outcome: Progressing

## 2023-04-13 NOTE — Inpatient Diabetes Management (Signed)
Inpatient Diabetes Program Recommendations  AACE/ADA: New Consensus Statement on Inpatient Glycemic Control  Target Ranges:  Prepandial:   less than 140 mg/dL      Peak postprandial:   less than 180 mg/dL (1-2 hours)      Critically ill patients:  140 - 180 mg/dL    Latest Reference Range & Units 04/12/23 07:07 04/12/23 11:21 04/12/23 16:39 04/12/23 21:16 04/13/23 06:35  Glucose-Capillary 70 - 99 mg/dL 132 (H)  Novolog 2 units 475 (H)  Novolog 15 units 325 (H)  Novolog 5 units 517 (HH)  Novolog 5 units  Semglee 50 units 148 (H)  Novolog 2 units    Review of Glycemic Control  Diabetes history: DM1 (does NOT make any insulin; requires basal, correction, and carb coverage insulin) Outpatient Diabetes medications: Lantus 55 units QHS, Humalog for meal coverage and correction (1 unit for 8 grams of carbs, 1 unit drops glucose 50 mg/dl Current orders for Inpatient glycemic control: Semglee 50 units at bedtime, Novolog 0-15 units TID with meals, Novolog 0-5 units at bedtime  Inpatient Diabetes Program Recommendations:    Insulin: Patient has Type 1 DM and makes NO insulin. He will require meal coverage insulin. Would not recommend to over basalize with basal insulin as patient would be at increased risk of hypoglycemia if not eating.  Please decrease Semglee to 40 units at bedtime and add Novolog 6 units TID with meals for meal coverage if patient eats at least 50% of meals.  Thanks, Orlando Penner, RN, MSN, CDCES Diabetes Coordinator Inpatient Diabetes Program [phone] (Team Pager from 8am to 5pm)

## 2023-04-13 NOTE — Progress Notes (Signed)
Patient discharged at this time. All discharge instructions given to patient with acknowledgement. No hyper/hypoglycemic symptoms noted upon discharge.

## 2023-05-13 ENCOUNTER — Other Ambulatory Visit (HOSPITAL_COMMUNITY): Payer: Self-pay | Admitting: Psychiatry

## 2023-05-22 ENCOUNTER — Other Ambulatory Visit (HOSPITAL_COMMUNITY): Payer: Self-pay | Admitting: Psychiatry

## 2023-08-15 ENCOUNTER — Encounter: Payer: Self-pay | Admitting: Podiatry

## 2023-08-15 ENCOUNTER — Ambulatory Visit: Payer: 59

## 2023-08-15 ENCOUNTER — Ambulatory Visit: Payer: 59 | Admitting: Podiatry

## 2023-08-15 VITALS — Ht 71.0 in | Wt 140.0 lb

## 2023-08-15 DIAGNOSIS — L603 Nail dystrophy: Secondary | ICD-10-CM | POA: Diagnosis not present

## 2023-08-15 DIAGNOSIS — M2042 Other hammer toe(s) (acquired), left foot: Secondary | ICD-10-CM | POA: Diagnosis not present

## 2023-08-15 DIAGNOSIS — M2041 Other hammer toe(s) (acquired), right foot: Secondary | ICD-10-CM

## 2023-08-15 DIAGNOSIS — L600 Ingrowing nail: Secondary | ICD-10-CM

## 2023-08-15 DIAGNOSIS — M2142 Flat foot [pes planus] (acquired), left foot: Secondary | ICD-10-CM

## 2023-08-15 DIAGNOSIS — M2141 Flat foot [pes planus] (acquired), right foot: Secondary | ICD-10-CM

## 2023-08-15 DIAGNOSIS — E119 Type 2 diabetes mellitus without complications: Secondary | ICD-10-CM

## 2023-08-15 NOTE — Progress Notes (Signed)
  Subjective:  Patient ID: Jesus Ewing, male    DOB: [birth year],  MRN: [id number]  Chief Complaint  Patient presents with   Foot Pain    Pt is here due to bilateral foot pain, he states he is on his feet more due to his job, and after working his foot pain is unbearable and hard to walk on. This has been going on for a few weeks.    23 y.o. male presents with the above complaint. History confirmed with patient.  He notes pain on the right second toe physic there is a lot of pressure on that he has some discoloration of the left great toenail  Objective:  Physical Exam: warm, good capillary refill, no trophic changes or ulcerative lesions, normal DP and PT pulses, and abnormal sensory exam, loss of protective sensation, he has neuropathic pain and paresthesias, there is no edema or pain in the midfoot of the right foot, no gross deformity no rocker-bottom and no ulceration.  No evidence of acute or active Charcot.  Right second medial border is incurvated but no paronychia.  There is dystrophy and discoloration of the left great toe   Radiographs: Multiple views x-ray of the both feet show no change in alignment, no fragmentation or signs of Charcot arthropathy, no heterotopic bone formation at amputation margin Assessment:   1. Pes planus of both feet   2. Hammer toe of right foot   3. Hammer toe of left foot   4. Nail dystrophy   5. Ingrown toenail   6. Encounter for diabetic foot exam [facility])      Plan:  Patient was evaluated and treated and all questions answered.  Right second toenail debrided in a slant back fashion to alleviate the offending border he will continue his regular pedicures.  We discussed that long-term treatment with permanent partial matricectomy of this toenail may offer some relief but would like to know what his A1c is and will need to be under 7.5% to proceed with this otherwise the risk of infection and complication is too high and could result in  amputation.  Dispensed a silicone toe cap to keep pressure off this we discussed appropriate shoes and new balance is may be better options to fit his [place] brace and may also need to consider a partial amputation filler to be added to this as well.  Follow-up as needed and he will schedule an appoint with his endocrinologist to have his A1c checked.  Diabetic foot exam performed today, he remains at high risk due to his significant neuropathy and uncontrolled diabetes  No follow-ups on file.

## 2023-08-15 NOTE — Patient Instructions (Signed)
More silicone pads can be purchased from:  [email or web address]

## 2023-08-20 ENCOUNTER — Ambulatory Visit: Payer: 59 | Admitting: Podiatry
